# Patient Record
Sex: Female | Born: 1955 | Race: White | Hispanic: No | Marital: Married | Smoking: Former smoker
Health system: Southern US, Community
[De-identification: ages and names within clinical notes are randomized; demographics above are authoritative.]

## PROBLEM LIST (undated history)

## (undated) DIAGNOSIS — J309 Allergic rhinitis, unspecified: Secondary | ICD-10-CM

## (undated) DIAGNOSIS — K219 Gastro-esophageal reflux disease without esophagitis: Secondary | ICD-10-CM

## (undated) DIAGNOSIS — M1990 Unspecified osteoarthritis, unspecified site: Secondary | ICD-10-CM

## (undated) DIAGNOSIS — I878 Other specified disorders of veins: Secondary | ICD-10-CM

## (undated) DIAGNOSIS — I459 Conduction disorder, unspecified: Secondary | ICD-10-CM

## (undated) DIAGNOSIS — H409 Unspecified glaucoma: Secondary | ICD-10-CM

## (undated) HISTORY — PX: SHOULDER SURGERY: SHX246

## (undated) HISTORY — PX: COLONOSCOPY: SHX174

## (undated) HISTORY — PX: TUBAL LIGATION: SHX77

## (undated) HISTORY — PX: CHOLECYSTECTOMY: SHX55

## (undated) HISTORY — DX: Unspecified glaucoma: H40.9

## (undated) HISTORY — PX: JOINT REPLACEMENT: SHX530

## (undated) HISTORY — DX: Allergic rhinitis, unspecified: J30.9

## (undated) HISTORY — DX: Other specified disorders of veins: I87.8

## (undated) HISTORY — PX: OTHER SURGICAL HISTORY: SHX169

## (undated) HISTORY — PX: BACK SURGERY: SHX140

## (undated) HISTORY — DX: Unspecified osteoarthritis, unspecified site: M19.90

---

## 1997-08-12 ENCOUNTER — Ambulatory Visit (HOSPITAL_BASED_OUTPATIENT_CLINIC_OR_DEPARTMENT_OTHER): Admission: RE | Admit: 1997-08-12 | Discharge: 1997-08-12 | Payer: Self-pay | Admitting: Otolaryngology

## 1999-04-25 ENCOUNTER — Encounter: Payer: Self-pay | Admitting: Neurosurgery

## 1999-04-25 ENCOUNTER — Ambulatory Visit (HOSPITAL_COMMUNITY): Admission: RE | Admit: 1999-04-25 | Discharge: 1999-04-25 | Payer: Self-pay | Admitting: Neurosurgery

## 1999-05-02 ENCOUNTER — Encounter: Payer: Self-pay | Admitting: Neurosurgery

## 1999-05-04 ENCOUNTER — Inpatient Hospital Stay (HOSPITAL_COMMUNITY): Admission: RE | Admit: 1999-05-04 | Discharge: 1999-05-05 | Payer: Self-pay | Admitting: Neurosurgery

## 1999-05-04 ENCOUNTER — Encounter: Payer: Self-pay | Admitting: Neurosurgery

## 1999-09-26 ENCOUNTER — Encounter: Payer: Self-pay | Admitting: Family Medicine

## 1999-09-26 ENCOUNTER — Encounter: Admission: RE | Admit: 1999-09-26 | Discharge: 1999-09-26 | Payer: Self-pay | Admitting: *Deleted

## 2000-06-18 ENCOUNTER — Encounter: Payer: Self-pay | Admitting: Orthopaedic Surgery

## 2000-06-18 ENCOUNTER — Ambulatory Visit (HOSPITAL_COMMUNITY): Admission: RE | Admit: 2000-06-18 | Discharge: 2000-06-18 | Payer: Self-pay | Admitting: Orthopaedic Surgery

## 2000-07-31 ENCOUNTER — Ambulatory Visit (HOSPITAL_COMMUNITY): Admission: RE | Admit: 2000-07-31 | Discharge: 2000-07-31 | Payer: Self-pay | Admitting: Orthopaedic Surgery

## 2000-07-31 ENCOUNTER — Encounter: Payer: Self-pay | Admitting: Orthopaedic Surgery

## 2000-10-27 ENCOUNTER — Encounter: Admission: RE | Admit: 2000-10-27 | Discharge: 2000-10-27 | Payer: Self-pay | Admitting: Family Medicine

## 2000-10-27 ENCOUNTER — Encounter: Payer: Self-pay | Admitting: Family Medicine

## 2001-03-05 ENCOUNTER — Encounter: Payer: Self-pay | Admitting: Family Medicine

## 2001-03-05 ENCOUNTER — Ambulatory Visit (HOSPITAL_COMMUNITY): Admission: RE | Admit: 2001-03-05 | Discharge: 2001-03-05 | Payer: Self-pay | Admitting: Family Medicine

## 2001-11-13 ENCOUNTER — Encounter: Payer: Self-pay | Admitting: Family Medicine

## 2001-11-13 ENCOUNTER — Encounter: Admission: RE | Admit: 2001-11-13 | Discharge: 2001-11-13 | Payer: Self-pay | Admitting: Family Medicine

## 2002-12-15 ENCOUNTER — Encounter: Admission: RE | Admit: 2002-12-15 | Discharge: 2002-12-15 | Payer: Self-pay | Admitting: Family Medicine

## 2004-05-17 ENCOUNTER — Encounter: Admission: RE | Admit: 2004-05-17 | Discharge: 2004-05-17 | Payer: Self-pay | Admitting: Family Medicine

## 2005-06-26 ENCOUNTER — Encounter: Admission: RE | Admit: 2005-06-26 | Discharge: 2005-06-26 | Payer: Self-pay | Admitting: Family Medicine

## 2005-07-17 ENCOUNTER — Encounter: Admission: RE | Admit: 2005-07-17 | Discharge: 2005-07-17 | Payer: Self-pay | Admitting: Family Medicine

## 2005-09-04 ENCOUNTER — Ambulatory Visit: Payer: Self-pay | Admitting: Gastroenterology

## 2005-09-04 ENCOUNTER — Ambulatory Visit (HOSPITAL_COMMUNITY): Admission: RE | Admit: 2005-09-04 | Discharge: 2005-09-04 | Payer: Self-pay | Admitting: Gastroenterology

## 2006-01-09 ENCOUNTER — Encounter: Admission: RE | Admit: 2006-01-09 | Discharge: 2006-01-09 | Payer: Self-pay | Admitting: Family Medicine

## 2006-07-01 ENCOUNTER — Encounter: Admission: RE | Admit: 2006-07-01 | Discharge: 2006-07-01 | Payer: Self-pay | Admitting: Family Medicine

## 2007-05-05 ENCOUNTER — Ambulatory Visit: Payer: Self-pay | Admitting: Gastroenterology

## 2007-05-12 ENCOUNTER — Ambulatory Visit (HOSPITAL_COMMUNITY): Admission: RE | Admit: 2007-05-12 | Discharge: 2007-05-12 | Payer: Self-pay | Admitting: Gastroenterology

## 2007-05-12 ENCOUNTER — Encounter: Payer: Self-pay | Admitting: Gastroenterology

## 2007-05-12 ENCOUNTER — Ambulatory Visit: Payer: Self-pay | Admitting: Gastroenterology

## 2007-06-11 ENCOUNTER — Ambulatory Visit: Payer: Self-pay | Admitting: Gastroenterology

## 2007-07-02 ENCOUNTER — Encounter: Admission: RE | Admit: 2007-07-02 | Discharge: 2007-07-02 | Payer: Self-pay | Admitting: Family Medicine

## 2007-12-29 ENCOUNTER — Ambulatory Visit: Payer: Self-pay | Admitting: Gastroenterology

## 2008-06-12 ENCOUNTER — Emergency Department (HOSPITAL_COMMUNITY): Admission: EM | Admit: 2008-06-12 | Discharge: 2008-06-12 | Payer: Self-pay | Admitting: Emergency Medicine

## 2008-06-13 ENCOUNTER — Encounter (INDEPENDENT_AMBULATORY_CARE_PROVIDER_SITE_OTHER): Payer: Self-pay | Admitting: General Surgery

## 2008-06-13 ENCOUNTER — Ambulatory Visit (HOSPITAL_COMMUNITY): Admission: RE | Admit: 2008-06-13 | Discharge: 2008-06-13 | Payer: Self-pay | Admitting: Emergency Medicine

## 2008-07-12 ENCOUNTER — Emergency Department (HOSPITAL_COMMUNITY): Admission: EM | Admit: 2008-07-12 | Discharge: 2008-07-12 | Payer: Self-pay | Admitting: Emergency Medicine

## 2008-08-16 ENCOUNTER — Encounter: Admission: RE | Admit: 2008-08-16 | Discharge: 2008-08-16 | Payer: Self-pay | Admitting: Family Medicine

## 2008-08-23 ENCOUNTER — Inpatient Hospital Stay (HOSPITAL_COMMUNITY): Admission: RE | Admit: 2008-08-23 | Discharge: 2008-08-26 | Payer: Self-pay | Admitting: Orthopaedic Surgery

## 2009-03-12 ENCOUNTER — Emergency Department (HOSPITAL_COMMUNITY): Admission: EM | Admit: 2009-03-12 | Discharge: 2009-03-12 | Payer: Self-pay | Admitting: Family Medicine

## 2009-08-23 ENCOUNTER — Encounter: Admission: RE | Admit: 2009-08-23 | Discharge: 2009-08-23 | Payer: Self-pay | Admitting: Family Medicine

## 2010-02-25 ENCOUNTER — Encounter: Payer: Self-pay | Admitting: Family Medicine

## 2010-05-08 ENCOUNTER — Encounter (HOSPITAL_COMMUNITY)
Admission: RE | Admit: 2010-05-08 | Discharge: 2010-05-08 | Disposition: A | Discharge status: Home / Self Care | Payer: 59 | Source: Ambulatory Visit | Attending: Orthopaedic Surgery | Admitting: Orthopaedic Surgery

## 2010-05-08 LAB — APTT: aPTT: 36 seconds (ref 24–37)

## 2010-05-08 LAB — CBC
HCT: 37 % (ref 36.0–46.0)
Hemoglobin: 12.6 g/dL (ref 12.0–15.0)
MCH: 30.1 pg (ref 26.0–34.0)
MCV: 88.3 fL (ref 78.0–100.0)
Platelets: 239 10*3/uL (ref 150–400)
RDW: 12.5 % (ref 11.5–15.5)

## 2010-05-08 LAB — PROTIME-INR
INR: 0.99 (ref 0.00–1.49)
Prothrombin Time: 13.3 seconds (ref 11.6–15.2)

## 2010-05-13 LAB — BASIC METABOLIC PANEL
BUN: 4 mg/dL — ABNORMAL LOW (ref 6–23)
BUN: 6 mg/dL (ref 6–23)
CO2: 31 mEq/L (ref 19–32)
CO2: 32 mEq/L (ref 19–32)
CO2: 33 mEq/L — ABNORMAL HIGH (ref 19–32)
Calcium: 8.3 mg/dL — ABNORMAL LOW (ref 8.4–10.5)
Calcium: 8.5 mg/dL (ref 8.4–10.5)
Calcium: 9.9 mg/dL (ref 8.4–10.5)
Chloride: 102 mEq/L (ref 96–112)
Creatinine, Ser: 0.69 mg/dL (ref 0.4–1.2)
GFR calc Af Amer: >60 mL/min (ref >60)
GFR calc Af Amer: >60 mL/min (ref >60)
GFR calc non Af Amer: >60 mL/min (ref >60)
GFR calc non Af Amer: >60 mL/min (ref >60)
GFR calc non Af Amer: >60 mL/min (ref >60)
Glucose, Bld: 106 mg/dL — ABNORMAL HIGH (ref 70–99)
Glucose, Bld: 121 mg/dL — ABNORMAL HIGH (ref 70–99)
Glucose, Bld: 89 mg/dL (ref 70–99)
Potassium: 3.7 mEq/L (ref 3.5–5.1)
Potassium: 4 mEq/L (ref 3.5–5.1)
Sodium: 138 mEq/L (ref 135–145)
Sodium: 139 mEq/L (ref 135–145)

## 2010-05-13 LAB — CBC
HCT: 23.6 % — ABNORMAL LOW (ref 36.0–46.0)
HCT: 25.3 % — ABNORMAL LOW (ref 36.0–46.0)
HCT: 38.3 % (ref 36.0–46.0)
Hemoglobin: 8.2 g/dL — ABNORMAL LOW (ref 12.0–15.0)
Hemoglobin: 8.8 g/dL — ABNORMAL LOW (ref 12.0–15.0)
MCHC: 34.4 g/dL (ref 30.0–36.0)
MCV: 88.2 fL (ref 78.0–100.0)
MCV: 88.7 fL (ref 78.0–100.0)
Platelets: 242 10*3/uL (ref 150–400)
RDW: 13.4 % (ref 11.5–15.5)
RDW: 13.5 % (ref 11.5–15.5)
RDW: 13.6 % (ref 11.5–15.5)

## 2010-05-13 LAB — PROTIME-INR
INR: 1.3 (ref 0.00–1.49)
INR: 2.3 — ABNORMAL HIGH (ref 0.00–1.49)
INR: 4.5 — ABNORMAL HIGH (ref 0.00–1.49)
Prothrombin Time: 13.6 seconds (ref 11.6–15.2)
Prothrombin Time: 16.7 seconds — ABNORMAL HIGH (ref 11.6–15.2)
Prothrombin Time: 47.1 seconds — ABNORMAL HIGH (ref 11.6–15.2)

## 2010-05-13 LAB — APTT: aPTT: 31 seconds (ref 24–37)

## 2010-05-15 ENCOUNTER — Inpatient Hospital Stay (HOSPITAL_COMMUNITY)
Admission: RE | Admit: 2010-05-15 | Discharge: 2010-05-18 | DRG: 470 | Disposition: A | Discharge status: Home Health | Payer: 59 | Source: Ambulatory Visit | Attending: Orthopaedic Surgery | Admitting: Orthopaedic Surgery

## 2010-05-15 DIAGNOSIS — Z01812 Encounter for preprocedural laboratory examination: Secondary | ICD-10-CM

## 2010-05-15 DIAGNOSIS — M1610 Unilateral primary osteoarthritis, unspecified hip: Principal | ICD-10-CM | POA: Diagnosis present

## 2010-05-15 DIAGNOSIS — M169 Osteoarthritis of hip, unspecified: Principal | ICD-10-CM | POA: Diagnosis present

## 2010-05-15 LAB — DIFFERENTIAL
Basophils Absolute: 0 10*3/uL (ref 0.0–0.1)
Basophils Absolute: 0 10*3/uL (ref 0.0–0.1)
Basophils Relative: 0 % (ref 0–1)
Basophils Relative: 0 % (ref 0–1)
Eosinophils Absolute: 0.1 10*3/uL (ref 0.0–0.7)
Eosinophils Absolute: 0.1 10*3/uL (ref 0.0–0.7)
Eosinophils Relative: 1 % (ref 0–5)
Monocytes Absolute: 0.4 10*3/uL (ref 0.1–1.0)
Monocytes Absolute: 0.6 10*3/uL (ref 0.1–1.0)
Monocytes Relative: 4 % (ref 3–12)
Monocytes Relative: 6 % (ref 3–12)
Neutro Abs: 5.6 10*3/uL (ref 1.7–7.7)

## 2010-05-15 LAB — BASIC METABOLIC PANEL
CO2: 28 mEq/L (ref 19–32)
Calcium: 9.4 mg/dL (ref 8.4–10.5)
Chloride: 102 mEq/L (ref 96–112)
GFR calc Af Amer: >60 mL/min (ref >60)
Glucose, Bld: 106 mg/dL — ABNORMAL HIGH (ref 70–99)
Potassium: 3.5 mEq/L (ref 3.5–5.1)
Sodium: 136 mEq/L (ref 135–145)

## 2010-05-15 LAB — POCT CARDIAC MARKERS
CKMB, poc: <1 ng/mL — ABNORMAL LOW (ref 1.0–8.0)
Troponin i, poc: <0.05 ng/mL (ref 0.00–0.09)

## 2010-05-15 LAB — CBC
HCT: 40 % (ref 36.0–46.0)
HCT: 41.7 % (ref 36.0–46.0)
Hemoglobin: 14 g/dL (ref 12.0–15.0)
Hemoglobin: 14.4 g/dL (ref 12.0–15.0)
MCHC: 35.1 g/dL (ref 30.0–36.0)
MCV: 88.4 fL (ref 78.0–100.0)
Platelets: 204 10*3/uL (ref 150–400)
RBC: 4.52 MIL/uL (ref 3.87–5.11)
RDW: 12.9 % (ref 11.5–15.5)
RDW: 13.1 % (ref 11.5–15.5)
WBC: 11 10*3/uL — ABNORMAL HIGH (ref 4.0–10.5)

## 2010-05-15 LAB — COMPREHENSIVE METABOLIC PANEL
ALT: 16 U/L (ref 0–35)
Albumin: 3.9 g/dL (ref 3.5–5.2)
Alkaline Phosphatase: 62 U/L (ref 39–117)
Chloride: 98 mEq/L (ref 96–112)
Glucose, Bld: 108 mg/dL — ABNORMAL HIGH (ref 70–99)
Potassium: 4.2 mEq/L (ref 3.5–5.1)
Sodium: 135 mEq/L (ref 135–145)
Total Bilirubin: 0.7 mg/dL (ref 0.3–1.2)
Total Protein: 7.2 g/dL (ref 6.0–8.3)

## 2010-05-15 LAB — TYPE AND SCREEN: Antibody Screen: NEGATIVE

## 2010-05-15 LAB — APTT: aPTT: 32 seconds (ref 24–37)

## 2010-05-16 LAB — CBC
MCH: 29.6 pg (ref 26.0–34.0)
MCHC: 32.9 g/dL (ref 30.0–36.0)
MCV: 89.9 fL (ref 78.0–100.0)
Platelets: 203 10*3/uL (ref 150–400)
RDW: 12.8 % (ref 11.5–15.5)

## 2010-05-16 LAB — BASIC METABOLIC PANEL
BUN: 4 mg/dL — ABNORMAL LOW (ref 6–23)
CO2: 32 mEq/L (ref 19–32)
Calcium: 8.5 mg/dL (ref 8.4–10.5)
Creatinine, Ser: 0.73 mg/dL (ref 0.4–1.2)
Glucose, Bld: 146 mg/dL — ABNORMAL HIGH (ref 70–99)

## 2010-05-17 LAB — BASIC METABOLIC PANEL
BUN: 6 mg/dL (ref 6–23)
GFR calc non Af Amer: >60 mL/min (ref >60)
Glucose, Bld: 129 mg/dL — ABNORMAL HIGH (ref 70–99)
Potassium: 3.8 mEq/L (ref 3.5–5.1)

## 2010-05-17 LAB — CBC
Hemoglobin: 10.1 g/dL — ABNORMAL LOW (ref 12.0–15.0)
MCH: 30 pg (ref 26.0–34.0)
MCHC: 34 g/dL (ref 30.0–36.0)
Platelets: 231 10*3/uL (ref 150–400)
RDW: 12.5 % (ref 11.5–15.5)

## 2010-05-17 LAB — PROTIME-INR: Prothrombin Time: 20.4 seconds — ABNORMAL HIGH (ref 11.6–15.2)

## 2010-05-18 LAB — CBC
HCT: 25.7 % — ABNORMAL LOW (ref 36.0–46.0)
MCHC: 33.1 g/dL (ref 30.0–36.0)
MCV: 90.2 fL (ref 78.0–100.0)
RDW: 12.7 % (ref 11.5–15.5)

## 2010-05-18 LAB — BASIC METABOLIC PANEL
BUN: 6 mg/dL (ref 6–23)
CO2: 32 mEq/L (ref 19–32)
Chloride: 101 mEq/L (ref 96–112)
Creatinine, Ser: 0.7 mg/dL (ref 0.4–1.2)

## 2010-05-18 LAB — PROTIME-INR: INR: 3.25 — ABNORMAL HIGH (ref 0.00–1.49)

## 2010-06-05 NOTE — Op Note (Signed)
[name], [name]            ACCOUNT NO.:  [id number]  MEDICAL RECORD NO.:  [id number]           PATIENT TYPE:  I  LOCATION:  5019                         FACILITY:  [facility]  PHYSICIAN:  Lubertha Basque. Flavio Lindroth, M.D.DATE OF BIRTH:  09/16/1955  DATE OF PROCEDURE:  05/15/2010 DATE OF DISCHARGE:                              OPERATIVE REPORT   PREOPERATIVE DIAGNOSIS:  Right hip degenerative joint disease.  POSTOPERATIVE DIAGNOSIS:  Right hip degenerative joint disease.  PROCEDURE:  Right total hip replacement.  ANESTHESIA:  General.  ATTENDING SURGEON:  Lubertha Basque. Cassandra Santos, MD  ASSISTANTAve Filter, MD and Cassandra Jupiter, RN   INDICATIONS FOR PROCEDURE:  The patient is a 55 year old woman with long history of bilateral hip pain.  She is status post injections and pills and persists with difficulty.  She is a couple years out from a very successful hip replacement on one side and now is offered the same procedure on the right as she has pain which limits her ability to rest and walk.  Informed operative consent was obtained after discussion of possible complications including reaction to anesthesia, infection, DVT, PE, dislocation, and death.  SUMMARY OF FINDINGS AND PROCEDURE:  Under general anesthesia through a standard posterior approach, a right hip replacement was performed.  She had advanced degenerative change but excellent bone quality.  We addressed her problem with parts similar to her opposite side using a 5 high offset Summit stem with a 54 pinnacle cup and a hole eliminator. On this side we elected to use polyethylene and ceramic.  We placed a 10- degree +4 marathon liner with a +1.5 36 ceramic hip ball.  Jones Broom, MD assisted throughout and was invaluable to the completion of the case in that he helped position and retract while I performed the procedure.  He also closed simultaneously to help minimize OR time. [name] was also an  Geophysicist/field seismologist.  DESCRIPTION OF PROCEDURE:  The patient was taken to the operating suite where general anesthetic was applied without difficulty.  She was positioned in  lateral decubitus position with the right hip up.  An axillary roll was placed.  Hip positioners were utilized, and all bony prominences were appropriately padded.  She was then prepped and draped in normal sterile fashion.  After the administration of IV vancomycin, a posterior approach was taken to the right hip.  All appropriate antiinfective measures were used including the preoperative IV antibiotic, Betadine impregnated drape, and closed hooded exhaust systems for each member of surgical team.  A posterior incision was made with dissection down through an abundance of adipose tissue to the IT band and gluteus maximus fascia.  These structures were incised longitudinally to expose the short external rotators of the hip which were tagged and reflected.  A posterior capsulectomy was performed and the hip was dislocated.  A femoral neck cut was made above the lesser trochanter removing the femoral head.  The acetabulum was exposed and some labral tissues were excised.  Reaming was taken towards the inside wall of the pelvis and sequentially brought up to 53, followed by placement of a size 54 acetabular shell in appropriate  anteversion and tilt.  We placed a trial liner and turned our attention towards the femur.  This was reamed and broached up to size 5 which seemed to fit very well.  We performed a trial reduction and the +1.5 high offset seemed to give [place] best ability and leg length equality.  The trial components were removed.  We placed a hole eliminator in the acetabular shell followed by the +4 polyethylene lipped liner with the buildup in a posterior position.  On the femoral side, we placed a 5 high offset Summit stem which seated fully and capped this with the aforementioned ceramic hip ball which was 1.5 in  terms of length.  The hip was again reduced and was very stable in extension with external rotation as well as flexion with internal rotation.  Leg lengths came about equal.  The wound was irrigated, followed by reapproximation of short external rotators to the greater trochanteric region with nonabsorbable suture. IT band and gluteus maximus fascia reapproximated with #1 Vicryl in interrupted fashion followed by subcutaneous reapproximation in several layers with Vicryl and skin closure with staples.  A dry gauze dressing was applied.  Estimated blood loss was 200 mL and intraoperative fluids can be obtained from anesthesia records.  DISPOSITION:  The patient was extubated in the operating room and taken to recovery in stable condition.  She was to be admitted to the orthopedic surgery service for appropriate postop care to include perioperative antibiotics and Coumadin plus Lovenox for DVT prophylaxis.     Lubertha Basque [name], M.D.     PGD/MEDQ  D:  05/15/2010  T:  05/15/2010  Job:  [id number]  Electronically Signed by Marcene Corning M.D. on 06/05/2010 02:21:24 PM

## 2010-06-05 NOTE — Discharge Summary (Signed)
[name], [name]            ACCOUNT NO.:  [id number]  MEDICAL RECORD NO.:  [id number]           PATIENT TYPE:  I  LOCATION:  5019                         FACILITY:  [facility]  PHYSICIAN:  Lubertha Basque. Maxi Rodas, M.D.DATE OF BIRTH:  1955/07/11  DATE OF ADMISSION:  05/15/2010 DATE OF DISCHARGE:  05/18/2010                              DISCHARGE SUMMARY   ADMITTING DIAGNOSES: 1. Right hip end-stage degenerative joint disease. 2. Status post left hip replacement.  DISCHARGE DIAGNOSES: 1. Right hip end-stage degenerative joint disease. 2. Status post left hip replacement.  OPERATIONS:  Right total hip replacement.  BRIEF HISTORY:  [name] is a patient well known to our practice who has had left hip done and has done very well.  The right hip has been increasingly painful.  Her x-rays reveal end-stage DJD.  She is having pain when she ambulates, trouble sleeping at nighttime and it is all unrelieved by pain medicine and anti-inflammatory pills and we have discussed with her treatment options that being total hip replacement on the right side.  PERTINENT LABORATORY DATA AND X-RAY FINDINGS:  WBCs 8.7, RBCs 3.40, hemoglobin 10.3 with a drop to 8.5, platelets at 182.  Protimes and INRs drawn serially as she was on low-dose Coumadin protocol regulated by Pharmacy.  Sodium 137, potassium 4.3, glucose 106, BUN 6, creatinine 0.70.  COURSE IN THE HOSPITAL:  She was admitted postoperatively.  The order of control sheet for total joint replacements was implemented, which included Coumadin, Lovenox, anticoagulation regulated by Pharmacy. Perioperative antibiotics.  Appropriate stool softeners.  Knee-high TEDs.  Incentive spirometry.  Orthopedic p.r.n. orders were also implemented and then she was given appropriate pain medications, antiemetics, and antispasmodics.  Foley catheter was used indwelling for first 24 hours and then discontinued.  She was kept on her home medicines as well.   The first day postop, her vital signs were stable, blood pressure was within normal limits, Foley catheter was discontinued.  Her right hip was clean and dry and her lungs were clear. Abdomen was soft with positive bowel sounds.  The second day postop, she continued to progress well.  While her dressing was changed, her wound was noted to be benign.  No sign of infection.  Leg lengths appeared to be equal.  Working with physical therapy well and then later on the third day postop, she was discharged home.  CONDITION ON DISCHARGE:  Improved.  FOLLOWUP:  She will remain on Coumadin for a total of 4 weeks with an INR between 2 and 3.  She was given appropriate pain medicines, antispasmodics.  She could be touchdown weightbearing with a walker. Arrangements for Home Care for therapy and blood draws were arranged prior to her leaving the hospital.  She could be on a regular diet, advancing activity slowly.  Any sign of infection which is redness, increasing pain, drainage to call [phone], and we would see her back in the office immediately, otherwise at 2 weeks from the surgical date.     Lindwood Qua, P.A.   ______________________________ Lubertha Basque. Jerl Santos, M.D.    MC/MEDQ  D:  05/24/2010  T:  05/25/2010  Job:  [id number]  Electronically Signed by Lindwood Qua P.A. on 06/04/2010 09:39:29 AM Electronically Signed by Marcene Corning M.D. on 06/05/2010 02:21:29 PM

## 2010-06-19 NOTE — Assessment & Plan Note (Signed)
NAME:  [name], [name]             CHART#:  [id number]   DATE:  06/11/2007                       DOB:  Feb 05, 1955   PRIMARY CARE PHYSICIAN:  Dr. Lubertha South.   CHIEF COMPLAINT:  Follow-up colonoscopy/IBS.   PROBLEM LIST:  1. Irritable bowel syndrome alternates between constipation and      diarrhea, predominantly constipation, currently.  2. She had a ileocolonoscopy by Dr. Cira Servant on in May 12, 2007 for      rectal bleeding or bloody diarrhea.  She was found to have a normal      colonoscopy except for small internal hemorrhoids felt to be the      cause of her bleeding.  3. Two back surgeries.  4. Tubal ligation.  5. Left hip arthroscopy.   SUBJECTIVE:  [name] is a 55 year old female who is status post  colonoscopy for bloody diarrhea.  She had benign random biopsies.  She  has done well since her colonoscopy.  She was found to have internal  hemorrhoids.  She is having no further bleeding.  She denies any  abdominal pain.  Denies any melena.  She is more constipated at this  point.  She is having bowel movements anywhere between 2 and 3 days.  At  times she has diarrhea but this is only about 2 or 3 times per month.  Her weight has remained stable.  She denies any anorexia.  She does seem  to have diarrhea with foods such as pizza.  She denies any problems with  dairy products.   CURRENT MEDICATIONS:  See list from Jun 11, 2007.   ALLERGIES:  AMOXICILLIN AND PREDNISONE.   OBJECTIVE:  VITAL SIGNS:  Weight 180 pounds, height 63 inches,  temperature 98.4, blood pressure 110/70, pulse 76.  GENERAL:  [name] is a 55 year old Caucasian female who is alert,  oriented, pleasant, cooperative, in no acute distress.  HEENT:  Clear,  nonicteric.  Conjunctivae pink.  Oropharynx pink and moist without  lesions.  CHEST:  Heart regular rate and rhythm.  Normal S1-S2, without  murmurs, clicks, rubs or gallops.  ABDOMEN:  Protuberant with positive bowel sounds x4.  No bruits  auscultated.  Soft, nontender, non-distended without palpable mass,  hepatosplenomegaly.  No rebound/guarding.  EXTREMITIES:  Without  clubbing or edema.   ASSESSMENT:  [name] is a 55 year old female with irritable bowel  syndrome alternating between constipation and diarrhea, currently more  constipation predominant and history of internal hemorrhoids.   PLAN:  1. IBS literature given for her review.  2. Recommend a fiber supplement of choice,  either Benefiber or Fiber-      Sure or Fiber-Choice daily, MiraLax 17 grams as needed for      constipation.  3. Six months with Dr. Cira Servant or sooner if needed.       Lorenza Burton, N.P.  Electronically Signed     Kassie Mends, M.D.  Electronically Signed    KJ/MEDQ  D:  06/11/2007  T:  06/11/2007  Job:  [id number]   cc:   Donna Bernard, M.D.

## 2010-06-19 NOTE — Op Note (Signed)
NAMEBRITTAY, MOGLE            ACCOUNT NO.:  [id number]   MEDICAL RECORD NO.:  [id number]          PATIENT TYPE:  INP   LOCATION:  5038                         FACILITY:  [facility]   PHYSICIAN:  Lubertha Basque. Dalldorf, M.D.DATE OF BIRTH:  [birth year]   DATE OF PROCEDURE:  08/23/2008  DATE OF DISCHARGE:                               OPERATIVE REPORT   PREOPERATIVE DIAGNOSIS:  Left hip degenerative joint disease.   POSTOPERATIVE DIAGNOSIS:  Left hip degenerative joint disease.   PROCEDURE:  Left total hip replacement.   ANESTHESIA:  General.   ATTENDING SURGEON:  Lubertha Basque. Jerl Santos, MD   ASSISTANT:  Lindwood Qua, PA   INDICATIONS FOR PROCEDURE:  The patient is a 55 year old woman with a  long history of left hip pain and degeneration.  She failed oral anti-  inflammatories and has been treated with an arthroscopy 8 years ago and  intra-articular injections as well.  She has pain, which limits her  ability to rest and walk and at this point, she was offered a hip  replacement operation.  Informed operative consent was obtained after  discussion of possible complications including reaction to anesthesia,  infection, DVT, PE, dislocation, and death.   SUMMARY/FINDINGS AND PROCEDURE:  Under general anesthesia through a  standard posterior approach, a left total hip replacement was performed.  She had advanced degenerative change, but excellent bone quality.  We  addressed her problem with an uncemented DePuy system using a 5 high-  offset Summit stem capped with a 36 +5 hip ball.  On the acetabular  side, we placed a 52 100-series pinnacle cup with a 36 x 52 metal-on-  metal liner.  We did use a hole eliminator.  Bryna Colander assisted  throughout and was invaluable to the completion of the case, in that he  helped position and retract while I performed the procedure.  He also  closed simultaneously to help minimize OR time.   DESCRIPTION OF PROCEDURE:  The patient was taken to the  operating suite  where general anesthetic was applied without difficulty.  She was  positioned on the lateral decubitus position with the left hip up.  All  bony prominences were appropriately padded, axillary roll was placed,  and hip positioners were utilized.  She was prepped and draped in normal  sterile fashion.  After administration of IV Kefzol, posterior approach  was taken to the left hip.  Incision was made with dissection down  through an abundance of adipose tissue to the IT band.  All appropriate  anti-infected measures were used including Betadine impregnated drape,  closed-hooded exhaust systems for each member of the surgical team, and  the preoperative IV antibiotic.  The IT band and gluteus maximus fascia  were incised longitudinally to expose the short external rotators of the  hip, which were tagged and reflected.  A posterior capsulectomy was  performed.  The hip was dislocated.  The femoral neck cut was made above  the lesser trochanter.  The acetabulum was exposed and some labral  tissues were excised.  This was reamed toward the inner wall of the  pelvis with  a small reamer followed by up-reaming to the size 51 reamer  followed placement of the size 52 pinnacle cup in appropriate tilt and  anteversion.  A trial liner was placed.  Attention was turned toward the  femur, which was appropriately exposed.  This was reamed and then  broached up to a size 5, which seemed to fit well.  We did a trial  reduction with various head neck assemblies in the high-offset +5 and  that give [place] the best stability in leg length.  She was stable in  extension with external rotation and flexion with internal rotation.  The trial components were removed followed by placement of size 5 high-  offset Summit stem in appropriate anteversion.  A hole eliminator was  placed into the pinnacle cup followed by a 36 x 52 metal-on-metal liner.  On the femur, we placed the aforementioned 36 +5 hip  ball.  The hip was  again reduced and was stable in the aforementioned positions.  Leg  lengths were felt to be equal.  The wound was irrigated followed by  reapproximation of short external rotators to the greater trochanteric  region with nonabsorbable suture.  IT band and gluteus maximus fascia  were reapproximated with #1 Vicryl in interrupted fashion followed by  subcutaneous reapproximation in several layers with Vicryl.  Skin was  closed with staples.  We then placed a Mepilex dressing.  Estimated  blood loss was 100 mL and intraoperative fluids were obtained from  anesthesia records.   DISPOSITION:  The patient was extubated in the operating room and taken  to recovery room in stable condition.  She was admitted to the  Orthopedic Surgery Service for appropriate postop care to include  perioperative antibiotics and Coumadin plus Lovenox for DVT prophylaxis.      Lubertha Basque Jerl Santos, M.D.  Electronically Signed     PGD/MEDQ  D:  08/23/2008  T:  08/24/2008  Job:  [id number]

## 2010-06-19 NOTE — Op Note (Signed)
[name], [name]            ACCOUNT NO.:  [id number]   MEDICAL RECORD NO.:  [id number]          PATIENT TYPE:  AMB   LOCATION:  DAY                           FACILITY:  [facility]   PHYSICIAN:  Kassie Mends, M.D.      DATE OF BIRTH:  Sep 02, 1955   DATE OF PROCEDURE:  05/12/2007  DATE OF DISCHARGE:                               OPERATIVE REPORT   PROCEDURE:  Ileocolonoscopy with cold forceps biopsy.   INDICATION FOR EXAM:  [name] is a 55 year old female who  presented with acute onset of bloody diarrhea.   FINDINGS:  1. Normal terminal ileum.  Proximal 20 cm visualized.  2. Normal colon without evidence of polyps, mass, inflammatory      changes, diverticula or arteriovenous malformations.  Random      biopsies obtained via cold forceps to evaluate for microscopic      colitis.  3. Small internal hemorrhoids, which bled on digital exam.  Otherwise      normal retroflexed view of the rectum.   DIAGNOSIS:  No source for [name]'s diarrhea identified.  Her  rectal bleeding is likely secondary to hemorrhoids.   RECOMMENDATIONS:  1. Will call [name] with the results of her biopsies.  2. Screening colonoscopy in 10 years.  She should follow a high fiber      diet.  She is given handout on high-fiber diet and hemorrhoids.  3. Recommend Anusol-HC per rectum every 12 hours for 14 days.  4. Return visit in 4 weeks with Dr. Cira Servant to reassess her rectal      bleeding and diarrhea.   MEDICATIONS:  1. Demerol 50 mg IV.  2. Versed 4 mg IV.   PROCEDURE TECHNIQUE:  Physical exam was performed.  Informed consent was  obtained from the patient after explaining the benefits, risks and  alternatives to the procedure.  The patient was connected to the monitor  and placed in left lateral position.  Continuous oxygen was provided by  nasal cannula and IV medicine administered through an indwelling  cannula.  After administration of sedation and rectal exam, the  patient's  rectum was intubated and scope was advanced under direct  visualization to the distal terminal ileum.  The scope was removed  slowly by carefully  examining the color, texture, anatomy and integrity of the mucosa on the  way out.  The patient was recovered in endoscopy and discharged home in  satisfactory condition.   PATH:  RANDOM BIOPSIES NL.      Kassie Mends, M.D.  Electronically Signed     SM/MEDQ  D:  05/12/2007  T:  05/12/2007  Job:  [id number]   cc:   Donna Bernard, M.D.  Fax: [phone]

## 2010-06-19 NOTE — Consult Note (Signed)
[name], [name]           ACCOUNT NO.:  [id number]   MEDICAL RECORD NO.:  [id number]           PATIENT TYPE:  AMB   LOCATION:  DAY                           FACILITY:  [facility]   PHYSICIAN:  Kassie Mends, M.D.      DATE OF BIRTH:  August 21, 1955   DATE OF CONSULTATION:  05/05/2007  DATE OF DISCHARGE:                                 CONSULTATION   REFERRING PHYSICIAN:  Lubertha South   PROBLEM LIST:  1. Bloody diarrhea.  2. History of depression.  3. Allergies.  4. Glaucoma.   SUBJECTIVE:  [name] is a 55 year old female who presents as a  return patient visit.  She was last seen in August 2007 for colon cancer  screening.  At that time, her colonoscopy was unremarkable.  She states  approximately 1 month ago, she began to have bloody diarrhea.  She was  treated with Cipro and Flagyl which helped, but 1-2 times a week she  still sees blood in her stool.  Occasionally, she has had severe nausea  associated with the bloody stools.  She does not have any history of  hemorrhoids.  She denies any abdominal cramping or pain associated with  the rectal bleeding.  Her stool vary from being hard to diarrhea once a  week.  This is a change in her bowel habits.  She usually has a cup of  coffee and then a normal stool.  She has multiple stressors in her life,  job and personal.  She did stop using Celebrex in January 2009 but uses  Aleve once a week.  She denies any use of aspirin, BC's, or Goody  Powders.  She had no previous problems taking Aleve.   PAST MEDICAL HISTORY:  None.  She has never had a stroke or heart  attack.   PAST SURGICAL HISTORY:  1. Tubal ligation.  2. Two back surgeries.  3. Left hip arthroscopy.   ALLERGIES:  AMOXICILLIN AND PREDNISONE.   FAMILY HISTORY:  Her mother had polyps at age greater than 74.  She has  no family history of colon cancer, ulcerative colitis, or Crohn's  disease.   SOCIAL HISTORY:  She works for [employer] and [employer].  She quit smoking 24 years ago.  She used to smoke 2-3  cigarettes a day.  She denies any alcohol use.   MEDICATIONS:  None.   OBJECTIVE:   PHYSICAL EXAM:  Weight 186.5 pounds, height 5 feet three inches,  temperature 98.1, blood pressure 118/80, pulse 60.  GENERAL:  She is in  no apparent distress, alert and orient x4.  HEENT:  Atraumatic, normocephalic.  Pupils equal and reactive to light.  Mouth:  No oral lesions.  Posterior pharynx without erythema or exudate.  NECK:  Full range of motion.  No lymphadenopathy.  LUNGS:  Clear to  auscultation bilaterally.  CARDIOVASCULAR:  Regular rhythm, no  murmur.ABDOMEN:  Bowel sounds are present, soft, nontender,  nondistended.  EXTREMITIES:  No cyanosis or edema.  SKIN:  No pretibial  lesions.  NEURO:  She has no focal neurologic deficits.   ASSESSMENT:  [name] is a 55 year old female who has acute onset  of bloody diarrhea.  The differential diagnosis includes hemorrhoids  with irritable bowel syndrome, colitis (infectious, inflammatory), and a  low likelihood of colorectal cancer or NSAID-induced colitis.  Thank you  for allowing me to see [name] in consultation.  My  recommendations follow.   RECOMMENDATIONS:  1. We will schedule a colonoscopy with MOVI prep next week.  2. We will schedule her follow up appointment based on the findings of      her colonoscopy.      Kassie Mends, M.D.  Electronically Signed     SM/MEDQ  D:  05/05/2007  T:  05/05/2007  Job:  [id number]   cc:   [name], M.D.  Fax: [phone]

## 2010-06-19 NOTE — Discharge Summary (Signed)
NAMEBHUMI, GODBEY            ACCOUNT NO.:  [id number]   MEDICAL RECORD NO.:  [id number]          PATIENT TYPE:  INP   LOCATION:  5038                         FACILITY:  [facility]   PHYSICIAN:  Lubertha Basque. Dalldorf, M.D.DATE OF BIRTH:  [birth year]   DATE OF ADMISSION:  08/23/2008  DATE OF DISCHARGE:  08/26/2008                               DISCHARGE SUMMARY   ADMITTING DIAGNOSIS:  Left hip end-stage degenerative joint disease.   DISCHARGE DIAGNOSIS:  Left hip end-stage degenerative joint disease.   OPERATIONS:  Left total hip replacement.   BRIEF HISTORY:  [name] is a 55 year old white female.  The  patient is well known to our practice.  She has been having increasing  left hip pain, pain when she walks, trouble sleeping at nighttime and x-  rays reveal end-stage DJD.  She has had a hip arthroscopy back in 2002,  which helped temporarily and she has been on anti-inflammatory  medicines, which no longer are helpful along with Tylenol No. 3.   PERTINENT LABORATORY AND X-RAY FINDINGS:  WBC 7.1, hemoglobin 13.2 with  a drop to 8.5, hematocrit 38.3.  Last INR was 4.5.  Sodium 139,  potassium within the normal range, creatinine 0.79, BUN 6.   COURSE IN THE HOSPITAL:  Denita was admitted postoperatively, placed on  variety of IV pain medicines including a Dilaudid PCA pump, vancomycin  protocol for preventative antibiotics, Lovenox and Coumadin per pharmacy  protocol, and then other p.o. medicines which included antiemetics,  sleeping agents, muscle relaxers.  She was in a knee immobilizer while  in bed to prevent dislocation.  Physical therapy to be touchdown  weightbearing as tolerated.  She had knee-high TEDs, incentive  spirometry, and increased activity diet was advanced as tolerated.  The  first day postop, her wound was benign, her lungs were clear, her  abdomen was soft.  Blood pressure 98/61, temperature 99.  Hemoglobin  9.7, INR 1.3.  Her INR jumped to 2.3 at the second  day.  Lungs were  clear.  Abdomen was soft.  Voiding on her own and Foley catheter had  been discontinued.  Wound was noted to be benign.  Dressing changed.  On  the third day, postop, her INR was 4.5.  Coumadin was held on that day,  which she was feeling good, was getting up and walking with therapy,  touchdown weightbearing, wanted to be discharged home.  Dressing was  changed.  Wound was noted to be benign.   CONDITION ON DISCHARGE:  Improved.   FOLLOWUP:  She will be on a low-sodium heart-healthy diet.  May change  her dressing daily.  Touchdown weightbearing as tolerated.  Return to  Dr. Jerl Santos in 10-14 days.   Percocet prescription given one or two every 4-6 hours for pain, Robaxin  one q.8 p.r.n. spasm, Coumadin dose to be regulated by pharmacy, dose  held the day of discharge and requested an INR to be drawn by [facility], Saturday or even possibly Sunday, and then dose regulated  depending on that result.  She is warned of any sign of infection,  bleeding, increasing  redness, drainage to call our office, we would see  her immediately, [phone].      Lindwood Qua, P.A.      Lubertha Basque Jerl Santos, M.D.  Electronically Signed    MC/MEDQ  D:  08/26/2008  T:  08/27/2008  Job:  [id number]

## 2010-06-19 NOTE — Op Note (Signed)
NAMEMAXWELL, MARTORANO            ACCOUNT NO.:  [id number]   MEDICAL RECORD NO.:  [id number]          PATIENT TYPE:  AMB   LOCATION:  DAY                           FACILITY:  [facility]   PHYSICIAN:  Dalia Heading, M.D.  DATE OF BIRTH:  February 04, 1956   DATE OF PROCEDURE:  06/13/2008  DATE OF DISCHARGE:  06/13/2008                               OPERATIVE REPORT   PREOPERATIVE DIAGNOSES:  1. Cholecystitis.  2. Cholelithiasis.  3. Incisional hernia.   POSTOPERATIVE DIAGNOSES:  1. Cholecystitis.  2. Cholelithiasis.  3. Incisional hernia.   PROCEDURES:  1. Laparoscopic cholecystectomy.  2. Incisional herniorrhaphy with mesh.   SURGEON:  Dalia Heading, MD   ANESTHESIA:  General endotracheal.   INDICATIONS:  The patient is a 55 year old white female presents with  right upper quadrant abdominal pain and nausea.  She was found on  ultrasound gallbladder to have cholecystitis with cholelithiasis.  Her  common bile duct was noted be within normal limits.  On examination, she  also has an incisional hernia at the umbilicus due to a previous tubal  ligation.  The risks and benefits of both procedures including bleeding,  infection, hepatobiliary injury, and the possibility of an open  procedure were fully explained to the patient, we gave informed consent.   PROCEDURE NOTE:  The patient was placed in supine position.  After  induction of general endotracheal anesthesia, the abdomen was prepped  and draped using the usual sterile technique with DuraPrep.  Surgical  site confirmation was performed.   An infraumbilical incision was made down to the fascia.  A Veress needle  was introduced into the abdominal cavity and confirmation of placement  was done using the saline drop test.  The abdomen was then insufflated  to 16 mmHg pressure.  A 11-mm trocar was introduced into the abdominal  cavity under direct visualization without difficulty.  The patient was  placed in reverse Trendelenburg  position.  An additional 11-mm trocar  was placed in the epigastric region and 5 mm trocars were placed in the  right upper quadrant and right flank regions.  Liver was inspected and  noted be within normal limits.  The gallbladder was noted to be  thickened and distended.  The gallbladder had to be decompressed at the  fundus.  Hydrops of the gallbladder was found.  Multiple stones were  noted throughout the gallbladder and into the cystic duct.  In the  cystic duct, they were milked up into the lumen of the gallbladder.  The  gallbladder was dissected in a retrograde fashion from the fundus to the  cystic duct.  The juncture of the infundibulum and the cystic duct were  fully identified.  A vascular Endo-GIA was placed across the cystic duct  and fired.  The cystic artery was not fully identified.  They suspected  that the cholecystitis caused the cystic artery to be obliterated.  Several stones did spill out from the gallbladder while it was being  removed using an EndoCatch bag.  These were retrieved as able.  The  gallbladder fossa was inspected.  No abnormal bleeding or  bile leakage  was noted.  Surgicel was placed in the gallbladder fossa.  All fluid and  air were then evacuated from the abdominal cavity prior to removal of  the trocars.   Next, the infraumbilical incision was extended.  An incisional  herniorrhaphy was then performed.  The umbilicus was freed away from the  hernia sac.  The hernia sac was excised and omentum was noted to be  entrapped within it.  This was reduced without difficulty.  A Proceed  Ventral Patch was then secured to the fascia using 2-0 Novofil  interrupted sutures.  The overlying fascia was then reapproximated using  0 Ethibond interrupted sutures.  The base of the umbilicus was secured  back to the fascia using a 2-0 Vicryl interrupted suture.  The  subcutaneous layer was reapproximated using a 3-0 Vicryl interrupted  suture.  Skin was closed using  staples.  A 0 Vicryl interrupted sutures  were used to close the fascia of the epigastric wound.  The remaining  incisions were closed using staples.  A 0.5-cm Sensorcaine was instilled  in the surrounding wounds.  Betadine ointment and dry sterile dressings  were applied.   All tape and needle counts were correct at the end of the procedure.  The patient was extubated in the operating room and went back to  recovery room awake in stable condition.   COMPLICATIONS:  None.   SPECIMEN:  Gallbladder.   ESTIMATED BLOOD LOSS:  Less than 50 mL.      Dalia Heading, M.D.  Electronically Signed     MAJ/MEDQ  D:  06/13/2008  T:  06/14/2008  Job:  [id number]   cc:   Donna Bernard, M.D.  Fax: [phone]

## 2010-06-19 NOTE — Assessment & Plan Note (Signed)
NAME:  [name], [name]             CHART#:  [id number]   DATE:  12/29/2007                       DOB:  [birth year]   REFERRING PHYSICIAN:  Donna Bernard, MD.   PROBLEM LIST:  1. Irritable bowel with intermittent constipation and diarrhea, but      predominantly constipation.  2. Ileal colonoscopy in April 2009 for rectal bleeding, which showed      small hemorrhoids.  3. Back surgery.  4. Tubal ligation.  5. Left hip arthroscopy.   SUBJECTIVE:  The patient is a 55 year old female who was last seen in  May 2009.  She still occasionally sees hemorrhoidal bleeding, but is  much improved since using the rectal suppositories.  She takes MiraLax  once a week.  She does not take any fiber supplements.  She is trying to  eat high-fiber diet by using raisin, bran, and muffins.  She has right  hip pain and so, she is on Tylenol No. 3, which causes constipation.  She is scheduled to have surgery in July 2009.   MEDICATIONS:  1. Aleve as needed.  2. Tylenol as needed.   OBJECTIVE/PHYSICAL EXAM:  VITAL SIGNS:  Weight 196 pounds (up 16 pounds  since May 2009), height 5 feet 3 inches, temperature 98, blood pressure  100/80, and pulse 72.  GENERAL:  She is in no apparent distress.  Alert and oriented x4.LUNGS:  Clear to auscultation bilaterally.CARDIOVASCULAR:  Regular rhythm.  ABDOMEN:  Bowel sounds present, soft, nontender, and nondistended.   ASSESSMENT:  The patient is a 55 year old female who had rectal bleeding  from hemorrhoids and her symptoms are fairly well controlled.  Her  constipation and diarrhea have resolved.  Thank you for allowing me to  see the patient in consultation.  My recommendations follow.   RECOMMENDATIONS:  Schedule follow up appointment as needed.  She should  continue high-fiber diet and MiraLax as needed.       Kassie Mends, M.D.  Electronically Signed     SM/MEDQ  D:  12/29/2007  T:  12/30/2007  Job:  [id number]   cc:   Donna Bernard, M.D.

## 2010-06-22 NOTE — Discharge Summary (Signed)
[place]. [facility]  Patient:    [name], [name]                   MRN: [id number] Adm. Date:  [id number] Disc. Date: 05/05/99 Attending:  Danella Penton Dictator:   Payton Doughty, M.D.                           Discharge Summary  ADMISSION DIAGNOSIS:  Herniated disc at right L5-S1.  DISCHARGE DIAGNOSIS:  Herniated disc at right L5-S1.  OPERATIONS:  L5-S1 laminectomy and diskectomy.  HISTORY OF PRESENT ILLNESS:  This is a 55 year old right-handed white female whose history and physical is recounted in the chart.  She visited Dr. Jeral Fruit for leg  pain.  She has an MRI that shows a herniated disc at L5-S1.  She was admitted for diskectomy.  PAST MEDICAL HISTORY: 1. Anterior cervical diskectomy in 1998. 2. Tubal ligation.  ALLERGIES:  AMOXICILLIN.  PHYSICAL EXAMINATION:  General examination is unremarkable.  NEUROLOGIC:  Right S1 radiculopathy.  HOSPITAL COURSE:  She was admitted after ascertaining normal laboratory values nd underwent an L5-S1 diskectomy on the right side.  Postoperatively, she has done  very well.  Her strength is full, her incision is dry.  She is eating and voiding normally.  She has a bit of S1 numbness, but strength is good.  Her incision is dry and well healing.  She is being discharged home to the care of her family with Percocet for pain.  [facility] a phone call on Monday to arrange follow up with Dr. Jeral Fruit. DD:  05/05/99 TD:  05/05/99 Job: [id number]

## 2010-06-22 NOTE — Op Note (Signed)
[name], [name]            ACCOUNT NO.:  [id number]   MEDICAL RECORD NO.:  [id number]          PATIENT TYPE:  AMB   LOCATION:  DAY                           FACILITY:  [facility]   PHYSICIAN:  Kassie Mends, M.D.      DATE OF BIRTH:  September 30, 1955   DATE OF PROCEDURE:  09/04/2005  DATE OF DISCHARGE:                                 OPERATIVE REPORT   PROCEDURE:  Colonoscopy.   MEDICATIONS:  1. Demerol 75 mg IV.  2. Versed 4 mg IV.   INDICATIONS FOR EXAM:  [name] is a 55 year old female who presents  for average risk colon cancer screening.   FINDINGS:  1. Normal colon.  No evidence of polyps, masses, inflammatory changes, or      vascular ectasia seen.  2. No diverticula evident.  3. Normal retroflex view of the rectum.   RECOMMENDATIONS:  1. Screening colonoscopy in ten years.  2. Follow up Dr. Simone Curia.   PROCEDURE TECHNIQUE:  Physical exam was performed.  Informed consent was  obtained from the patient after explaining all risks, benefits and  alternative to the procedure which the patient appeared to understand and so  stated.  The patient was connected to the monitoring device and placed in  the left lateral position.  Continuous oxygen was provided by nasal cannula  and IV medicine administered through an indwelling cannula.  After  administration of sedation and a rectal exam, the patient's rectum was  intubated.  The scope was advanced under direct visualization to  the cecum.  The scope was subsequently removed slowly by carefully examining  the color, texture, anatomy and integrity of the mucosa on the way out.  The  patient was recovered in the endoscopy suite and discharged to home in  satisfactory condition.      Kassie Mends, M.D.  Electronically Signed     SM/MEDQ  D:  09/05/2005  T:  09/05/2005  Job:  [id number]   cc:   Donna Bernard, M.D.  Fax: [phone]

## 2010-06-22 NOTE — H&P (Signed)
[place]. [facility]  Patient:    [name], [name]                     MRN: [id number] Adm. Date:  05/04/99 Attending:  Tanya Nones. Jeral Fruit, M.D.                         History and Physical  BRIEF HISTORY:  [name] is a lady who was seen by me in my office because of back pain with radiation down to the right leg.  We tried conservative treatment but she is not any better.  The pain is associated with numbness and weakness which is mostly outside the foot.  After failure of conservative treatment we went ahead and did an MRI.  Indeed the MRI showed a large herniated disc at L5-S1 and the distal 1-2.  We went ahead with surgery because she could not deal with the pain.  PAST MEDICAL HISTORY:  Anterior cervical discectomy done in 1998.  She had a tubal ligation.  ALLERGIES:  She is allergic to AMOXICILLIN.  SOCIAL HISTORY:  She does not smoke, does not drink.  FAMILY HISTORY:  Unremarkable.  PHYSICAL EXAMINATION:  The patient came to my office and she was limping from the right leg.  She had difficulty sitting.  HEENT:  Normal.  NECK:  Normal.  LUNGS:  Clear.  HEART:  Heard sounds normal.  ABDOMEN:  Normal.  EXTREMITIES:  Normal pulses.  There is a scar on the left side of the neck from previous surgery.  NEUROLOGIC:  Mental status normal.  Cranial nerves normal.  Sensory is normal except that she complains of some numbness on the outside of the foot going along with the S1 nerve root.  Reflexes are 2+ with absent right ankle jerk. Strength 5/5 with weakness on plantar flexion, dorsiflexion of the right foot.  Straight leg raising on the left side negative, right side is positive at about 45 degrees.  Coordination normal.  LABORATORY DATA:  The MRI showed a large herniated disc at the level of 5-1 with a free fragment.  CLINICAL IMPRESSION:  Right L5-S1 herniated disc.  RECOMMENDATIONS:  The patient is being admitted for  surgery.  She knows about the risks such as infection, CSF leak, worsening of pain, paralysis, need for further surgery.  She declined more opinion. DD:  05/04/99 TD:  05/04/99 Job: [id number]

## 2010-06-22 NOTE — Op Note (Signed)
[place]. [facility]  Patient:    [name], [name]                   MRN: [id number] Proc. Date: 07/31/00 Adm. Date:  [id number] Disc. Date: [id number] Attending:  Marcene Corning                           Operative Report  PREOPERATIVE DIAGNOSIS:  Left hip labral tear.  POSTOPERATIVE DIAGNOSIS:  Left hip labral tear.  OPERATION PERFORMED:  Left hip arthroscopy.  ANESTHESIA:  General.  ATTENDING SURGEON:  Lubertha Basque. Jerl Santos, M.D.  ASSISTANT:  Lindwood Qua, P.A.  INDICATIONS FOR PROCEDURE:  The patient is a 55 year old woman with a long history of a painful catch in her left hip.  This persisted despite therapy and oral anti-inflammatories.  She has undergone a preoperative MRI arthrogram which shows an abnormality of the labrum with minimal degenerative change.  At this point she was offered operative arthroscopy.  The procedure was discussed with the patient and informed operative consent was obtained after discussion of possible complications of reaction to anesthesia, infection and neurovascular injury.  DESCRIPTION OF PROCEDURE:  The patient was taken to an operating suite where general anesthetic was applied without difficulty.  She was then positioned in the lateral decubitus position where all bony prominences were appropriately padded including the placement of an axillary roll.  She was then given some longitudinal traction across a post in the groin which was well-padded.  She was prepped and draped in normal sterile fashion.  After the administration of preop intravenous antibiotics, a needle was placed over the greater trochanter into the hip joint under fluoroscopic guidance.  The joint was then injected with about 30 cc of sterile fluid.  A posterior portal was made 4 cm posterior to the greater trochanter and an anterior portal was made 4 cm anterior to the greater trochanter.  In both cases, the capsule was entered sharply with  a needle followed by dilatation up to 5 or 7 mm.  The scope was placed through one of these portals and an instrument through the other.  Through this we could see some mild degenerative change but certainly no evidence of end stage degenerative arthritis.  She did have some abnormality of the labrum consistent with a tear and this was debrided with a shaver.  There was also some moderate synovitis which was addressed with synovectomy.  The joint was thoroughly irrigated followed by placement of Marcaine with epinephrine and Depo-Medrol at the end of the case.  The portals were closed with simple sutures of nylon followed by Adaptic and a dry gauze dressing with tape. Estimated blood loss and intraoperative fluids can be obtained from Anesthesia records.  Traction was released during closure.  DISPOSITION:  The patient was extubated in the operating room and taken to the recovery room in stable condition.  Plans were for her to go home the same day and to follow up in the office in less than a week.  I will contact her by phone tonight. DD:  07/31/00 TD:  07/31/00 Job: [id number]

## 2010-06-22 NOTE — Op Note (Signed)
[place]. [facility]  Patient:    [name], [name]                   MRN: [id number] Proc. Date: 05/04/99 Adm. Date:  [id number] Attending:  Danella Penton                           Operative Report  PREOPERATIVE DIAGNOSIS:  Right L5-S1 herniated disk free fragment.  POSTOPERATIVE DIAGNOSIS:  Right L5-S1 herniated disk free fragment.  OPERATION PERFORMED:  Right L5-S1 diskectomy, removal of free fragment, decompression of the L5 and S1 nerve root.  MICROSCOPE:  Midas Rex.  SURGEON:  Tanya Nones. Jeral Fruit, M.D.  ASSISTANT:  Alanson Aly. Roxan Hockey, M.D.  ANESTHESIA:  INDICATIONS FOR PROCEDURE:  The patient had been seen by me because of right leg pain.  The pain is getting worse up to the point that last week she was having  lot of weakness and numbness.  MRI showed a large herniated disk with a free fragment.  The patient wanted to go ahead with surgery.  She knew of the risks uch as infection, CSF leak, worsening of the pain, paralysis, need for further surgery.  This lady about three years ago underwent an anterior cervical diskectomy.  DESCRIPTION OF PROCEDURE:  The patient was taken to the operating room and positioned in a prone manner.  The skin was prepped with Betadine.  A midline incision was made.  It was difficult to palpate the spinous process because of er size.  Nevertheless we went through the skin and through a thick layer of fat tissue.  We found the spinous process and with the x-ray we showed that indeed e were at the L5-1.  With the Midas Rex drill we drilled the lower level of L5, the upper of S1.  We brought the microscope into the area and with microdissection, we removed the yellow ligament.  We found the S1 nerve root to be swollen and almost glued to the canal.  Retraction was made and immediately we found three large fragments of disks going to the foramen.  Removal was done.  We entered the disk space and total  diskectomy was done.  ____________ of the thecal sac was done and we found there was a piece of end plate close to the L5 nerve root at the take-off. Removal was done.  Foraminotomy with decompression of L5-S1 was done.  The area was irrigated.  Fentanyl was left in the dural space.  Also fat was left over the area. The wound was closed with Vicryl and Steri-Strips. DD:  05/04/99 TD:  05/05/99 Job: [id number]

## 2010-08-30 ENCOUNTER — Other Ambulatory Visit: Payer: Self-pay | Admitting: Family Medicine

## 2010-08-30 DIAGNOSIS — Z1231 Encounter for screening mammogram for malignant neoplasm of breast: Secondary | ICD-10-CM

## 2010-09-05 ENCOUNTER — Ambulatory Visit
Admission: RE | Admit: 2010-09-05 | Discharge: 2010-09-05 | Disposition: A | Discharge status: Home / Self Care | Payer: 59 | Source: Ambulatory Visit | Attending: Family Medicine | Admitting: Family Medicine

## 2010-09-05 DIAGNOSIS — Z1231 Encounter for screening mammogram for malignant neoplasm of breast: Secondary | ICD-10-CM

## 2011-01-10 ENCOUNTER — Other Ambulatory Visit: Payer: Self-pay | Admitting: Family Medicine

## 2011-01-10 DIAGNOSIS — Z139 Encounter for screening, unspecified: Secondary | ICD-10-CM

## 2011-01-15 ENCOUNTER — Other Ambulatory Visit (HOSPITAL_COMMUNITY): Payer: 59

## 2011-01-17 ENCOUNTER — Ambulatory Visit (HOSPITAL_COMMUNITY)
Admission: RE | Admit: 2011-01-17 | Discharge: 2011-01-17 | Disposition: A | Discharge status: Home / Self Care | Payer: 59 | Source: Ambulatory Visit | Attending: Family Medicine | Admitting: Family Medicine

## 2011-01-17 ENCOUNTER — Ambulatory Visit: Payer: 59 | Attending: Family Medicine | Admitting: Sleep Medicine

## 2011-01-17 DIAGNOSIS — Z780 Asymptomatic menopausal state: Secondary | ICD-10-CM | POA: Insufficient documentation

## 2011-01-17 DIAGNOSIS — Z13820 Encounter for screening for osteoporosis: Secondary | ICD-10-CM | POA: Insufficient documentation

## 2011-01-17 DIAGNOSIS — Z6837 Body mass index (BMI) 37.0-37.9, adult: Secondary | ICD-10-CM | POA: Insufficient documentation

## 2011-01-17 DIAGNOSIS — R5381 Other malaise: Secondary | ICD-10-CM

## 2011-01-17 DIAGNOSIS — E669 Obesity, unspecified: Secondary | ICD-10-CM

## 2011-01-17 DIAGNOSIS — Z139 Encounter for screening, unspecified: Secondary | ICD-10-CM

## 2011-01-17 DIAGNOSIS — G4733 Obstructive sleep apnea (adult) (pediatric): Secondary | ICD-10-CM | POA: Insufficient documentation

## 2011-01-20 NOTE — Procedures (Signed)
[name], [name]            ACCOUNT NO.:  [id number]  MEDICAL RECORD NO.:  [id number]          PATIENT TYPE:  OUT  LOCATION:  SLEEP LAB                     FACILITY:  [facility]  PHYSICIAN:  Rastus Borton A. Gerilyn Pilgrim, M.D. DATE OF BIRTH:  [birth year]  DATE OF STUDY:  01/17/2011                           NOCTURNAL POLYSOMNOGRAM  REFERRING PHYSICIAN:  Laural Golden  INDICATION FOR STUDY:  This is a 55 year old lady who presents with snoring, fatigue and restless sleep.  This study is being done to evaluate for obstructive sleep apnea syndrome.  EPWORTH SLEEPINESS SCORE: 1. BMI 37.  ARCHITECTURAL SUMMARY:  The total recording time is 441 minutes.  Sleep efficiency 92.2%.  Sleep latency 4 minutes.  REM latency 262 minutes. Stage N1 5.5%, N2 70.8%, N3 7.1% and REM sleep 16.5%.  RESPIRATORY SUMMARY:  Baseline oxygen saturation is 98%, lowest saturation 87% during non-REM sleep.  Diagnostic AHI is 5.  LIMB MOVEMENT SUMMARY:  PLM index 0.  ELECTROCARDIOGRAM SUMMARY:  Average heart rate is 68 with no significant dysrhythmias observed.  MEDICATIONS:  Meloxicam, ranitidine, hydrocodone.   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  Thank you for this referral.     Trafton Roker A. Gerilyn Pilgrim, M.D.    KAD/MEDQ  D:  01/19/2011 19:09:12  T:  01/20/2011 01:24:57  Job:  [id number]

## 2011-11-25 ENCOUNTER — Other Ambulatory Visit: Payer: Self-pay | Admitting: Family Medicine

## 2011-11-25 DIAGNOSIS — Z1231 Encounter for screening mammogram for malignant neoplasm of breast: Secondary | ICD-10-CM

## 2011-12-25 ENCOUNTER — Ambulatory Visit
Admission: RE | Admit: 2011-12-25 | Discharge: 2011-12-25 | Disposition: A | Discharge status: Home / Self Care | Payer: 59 | Source: Ambulatory Visit | Attending: Family Medicine | Admitting: Family Medicine

## 2011-12-25 DIAGNOSIS — Z1231 Encounter for screening mammogram for malignant neoplasm of breast: Secondary | ICD-10-CM

## 2012-05-01 ENCOUNTER — Other Ambulatory Visit: Payer: Self-pay | Admitting: Orthopaedic Surgery

## 2012-05-01 DIAGNOSIS — M542 Cervicalgia: Secondary | ICD-10-CM

## 2012-05-06 ENCOUNTER — Other Ambulatory Visit: Payer: 59

## 2012-05-09 ENCOUNTER — Ambulatory Visit
Admission: RE | Admit: 2012-05-09 | Discharge: 2012-05-09 | Disposition: A | Discharge status: Home / Self Care | Payer: 59 | Source: Ambulatory Visit | Attending: Orthopaedic Surgery | Admitting: Orthopaedic Surgery

## 2012-05-09 DIAGNOSIS — M542 Cervicalgia: Secondary | ICD-10-CM

## 2012-07-14 ENCOUNTER — Encounter: Payer: Self-pay | Admitting: *Deleted

## 2012-08-24 ENCOUNTER — Other Ambulatory Visit: Payer: Self-pay | Admitting: *Deleted

## 2012-08-24 MED ORDER — RANITIDINE HCL 300 MG PO TABS: 300.0000 mg | ORAL_TABLET | Freq: Every day | ORAL | Status: DC

## 2012-10-02 ENCOUNTER — Ambulatory Visit (INDEPENDENT_AMBULATORY_CARE_PROVIDER_SITE_OTHER): Payer: 59 | Admitting: Nurse Practitioner

## 2012-10-02 ENCOUNTER — Encounter: Payer: Self-pay | Admitting: Nurse Practitioner

## 2012-10-02 VITALS — BP 144/80 | Temp 98.1°F | Ht 63.0 in | Wt 229.0 lb

## 2012-10-02 DIAGNOSIS — R079 Chest pain, unspecified: Secondary | ICD-10-CM

## 2012-10-02 DIAGNOSIS — R0600 Dyspnea, unspecified: Secondary | ICD-10-CM

## 2012-10-02 DIAGNOSIS — R0609 Other forms of dyspnea: Secondary | ICD-10-CM

## 2012-10-06 ENCOUNTER — Encounter: Payer: Self-pay | Admitting: Nurse Practitioner

## 2012-10-06 DIAGNOSIS — R0600 Dyspnea, unspecified: Secondary | ICD-10-CM | POA: Insufficient documentation

## 2012-10-06 NOTE — Assessment & Plan Note (Signed)
Plan: Start daily 81 mg aspirin. Avoid extreme activity for now. Refer to cardiology for further workup. Warning signs reviewed at length. Patient to call EMS or go to the ED if symptoms recur or worsen.

## 2012-10-06 NOTE — Progress Notes (Signed)
Subjective:  Presents for an episode that occurred 2 weeks ago. Was on an intense hike with her husband. Began having shortness of breath and nausea. Patient had to slow down and stop walking. Felt bad for several hours after this. Very hot outside the patient had chills. No abdominal pain. No chest pain /ischemic type pain but felt like she couldn't get her breath. States she did not feel well. Has had no further episodes but has avoided extreme activity since that time. Once a day patient will have a sharp localized pain near the anterior flank area right side just beneath the right breast lasting a few seconds.  Unassociated with activity.no edema. No cough. No fevers. Was on Coumadin several years ago following a hip replacement as a precautionary measure. Reflux well controlled with Zantac. [facility] her father and brother had heart disease. Her sister recently had a cardiac workup which was negative.   Objective:   BP 144/80  Temp(Src) 98.1 F (36.7 C) (Oral)  Ht 5\' 3"  (1.6 m)  Wt 229 lb (103.874 kg)  BMI 40.58 kg/m2  NAD. Alert, oriented. Mildly anxious affect. Lungs clear. Heart regular rate rhythm. No murmur or gallop noted. EKG within normal limits, no changes indicating recent MI. Reviewed with Dr. Lorin Picket. Lower extremities no edema. Moderate tenderness with palpation of the right anterior/lateral chest wall near the anterior axillary line just beneath the right breast. Localized to this area.  Assessment:Dyspnea  Chest pain - Plan: PR ELECTROCARDIOGRAM, COMPLETE, PR ELECTROCARDIOGRAM, COMPLETE  Plan: Start daily 81 mg aspirin. Avoid extreme activity for now. Refer to cardiology for further workup. Warning signs reviewed at length. Patient to call EMS or go to the ED if symptoms recur or worsen.

## 2012-10-19 ENCOUNTER — Inpatient Hospital Stay (HOSPITAL_COMMUNITY)
Admission: EM | Admit: 2012-10-19 | Discharge: 2012-10-23 | DRG: 494 | Disposition: A | Discharge status: Skilled Nursing Facility | Payer: 59 | Attending: Orthopedic Surgery | Admitting: Orthopedic Surgery

## 2012-10-19 ENCOUNTER — Encounter (HOSPITAL_COMMUNITY): Payer: Self-pay

## 2012-10-19 ENCOUNTER — Emergency Department (HOSPITAL_COMMUNITY): Payer: 59

## 2012-10-19 DIAGNOSIS — Y9301 Activity, walking, marching and hiking: Secondary | ICD-10-CM

## 2012-10-19 DIAGNOSIS — I493 Ventricular premature depolarization: Secondary | ICD-10-CM

## 2012-10-19 DIAGNOSIS — W19XXXA Unspecified fall, initial encounter: Secondary | ICD-10-CM

## 2012-10-19 DIAGNOSIS — H409 Unspecified glaucoma: Secondary | ICD-10-CM | POA: Diagnosis present

## 2012-10-19 DIAGNOSIS — W010XXA Fall on same level from slipping, tripping and stumbling without subsequent striking against object, initial encounter: Secondary | ICD-10-CM | POA: Diagnosis present

## 2012-10-19 DIAGNOSIS — I872 Venous insufficiency (chronic) (peripheral): Secondary | ICD-10-CM | POA: Diagnosis present

## 2012-10-19 DIAGNOSIS — S82202A Unspecified fracture of shaft of left tibia, initial encounter for closed fracture: Secondary | ICD-10-CM

## 2012-10-19 DIAGNOSIS — Z96649 Presence of unspecified artificial hip joint: Secondary | ICD-10-CM

## 2012-10-19 DIAGNOSIS — E669 Obesity, unspecified: Secondary | ICD-10-CM | POA: Diagnosis present

## 2012-10-19 DIAGNOSIS — M899 Disorder of bone, unspecified: Secondary | ICD-10-CM | POA: Diagnosis present

## 2012-10-19 DIAGNOSIS — M129 Arthropathy, unspecified: Secondary | ICD-10-CM | POA: Diagnosis present

## 2012-10-19 DIAGNOSIS — I4949 Other premature depolarization: Secondary | ICD-10-CM | POA: Diagnosis present

## 2012-10-19 DIAGNOSIS — K219 Gastro-esophageal reflux disease without esophagitis: Secondary | ICD-10-CM | POA: Diagnosis present

## 2012-10-19 DIAGNOSIS — Z6839 Body mass index (BMI) 39.0-39.9, adult: Secondary | ICD-10-CM

## 2012-10-19 DIAGNOSIS — S82142A Displaced bicondylar fracture of left tibia, initial encounter for closed fracture: Secondary | ICD-10-CM

## 2012-10-19 DIAGNOSIS — Z01810 Encounter for preprocedural cardiovascular examination: Secondary | ICD-10-CM

## 2012-10-19 DIAGNOSIS — S82109A Unspecified fracture of upper end of unspecified tibia, initial encounter for closed fracture: Principal | ICD-10-CM | POA: Diagnosis present

## 2012-10-19 HISTORY — DX: Gastro-esophageal reflux disease without esophagitis: K21.9

## 2012-10-19 LAB — CBC WITH DIFFERENTIAL/PLATELET
Basophils Absolute: 0 10*3/uL (ref 0.0–0.1)
Basophils Relative: 0 % (ref 0–1)
Eosinophils Relative: 1 % (ref 0–5)
Lymphocytes Relative: 21 % (ref 12–46)
MCV: 93.1 fL (ref 78.0–100.0)
Platelets: 212 10*3/uL (ref 150–400)
RDW: 13 % (ref 11.5–15.5)
WBC: 9.7 10*3/uL (ref 4.0–10.5)

## 2012-10-19 LAB — BASIC METABOLIC PANEL
Calcium: 9.8 mg/dL (ref 8.4–10.5)
Creatinine, Ser: 0.94 mg/dL (ref 0.50–1.10)
GFR calc Af Amer: 76 mL/min — ABNORMAL LOW (ref >90)

## 2012-10-19 MED ORDER — SODIUM CHLORIDE 0.9 % IV BOLUS (SEPSIS)
500.0000 mL | Freq: Once | INTRAVENOUS | Status: AC
  Administered 2012-10-19: 500 mL via INTRAVENOUS

## 2012-10-19 MED ORDER — HYDROMORPHONE HCL PF 1 MG/ML IJ SOLN
1.0000 mg | Freq: Once | INTRAMUSCULAR | Status: AC
  Administered 2012-10-19: 1 mg via INTRAMUSCULAR
  Filled 2012-10-19: qty 1

## 2012-10-19 MED ORDER — ONDANSETRON HCL 4 MG/2ML IJ SOLN
4.0000 mg | Freq: Once | INTRAMUSCULAR | Status: AC
  Administered 2012-10-19: 4 mg via INTRAVENOUS
  Filled 2012-10-19: qty 2

## 2012-10-19 MED ORDER — HYDROMORPHONE HCL PF 1 MG/ML IJ SOLN
1.0000 mg | Freq: Once | INTRAMUSCULAR | Status: AC
  Administered 2012-10-19: 1 mg via INTRAVENOUS
  Filled 2012-10-19: qty 1

## 2012-10-19 MED ORDER — SODIUM CHLORIDE 0.9 % IV SOLN
Freq: Once | INTRAVENOUS | Status: AC
  Administered 2012-10-19: 19:00:00 via INTRAVENOUS

## 2012-10-19 MED ORDER — HYDROMORPHONE HCL PF 1 MG/ML IJ SOLN
0.5000 mg | Freq: Once | INTRAMUSCULAR | Status: AC
  Administered 2012-10-19: 0.5 mg via INTRAVENOUS
  Filled 2012-10-19: qty 1

## 2012-10-19 NOTE — H&P (Signed)
Reason for Consult:   Left tibial plateau fracture Referring Physician:    EDP, [name] is an 57 y.o. female  Well known to me thru hip replacements on both sides who tripped after a hike today and felt a crunch in her leg.  [facility] to [facility] in [place] and xray followed by CT c/w comminuted tibial plateau fracture.  Transferred to [facility] for management.  Denies other pains as well as CP and LOC.Marland Kitchen    Past Medical History  Diagnosis Date  . Headache   . Allergic rhinitis   . Arthritis   . Glaucoma   . Reflux   . Venous stasis     Past Surgical History  Procedure Laterality Date  . Tubal ligation    . Cholecystectomy    . Joint replacement    . Back surgery    . Shoulder surgery    . Vocal cord surgery      Family History  Problem Relation Age of Onset  . Heart attack Father   . Diabetes Brother     Social History:  reports that she has never smoked. She does not have any smokeless tobacco history on file. She reports that she does not drink alcohol or use illicit drugs.  Allergies:  Allergies  Allergen Reactions  . Prednisone   . Amoxicillin Rash    Medications: I have reviewed the patient's current medications.  Results for orders placed during the hospital encounter of 10/19/12 (from the past 48 hour(s))  BASIC METABOLIC PANEL     Status: Abnormal   Collection Time    10/19/12  7:10 PM      Result Value Range   Sodium 140  135 - 145 mEq/L   Potassium 4.3  3.5 - 5.1 mEq/L   Chloride 103  96 - 112 mEq/L   CO2 29  19 - 32 mEq/L   Glucose, Bld 124 (*) 70 - 99 mg/dL   BUN 13  6 - 23 mg/dL   Creatinine, Ser 9.81  0.50 - 1.10 mg/dL   Calcium 9.8  8.4 - 19.1 mg/dL   GFR calc non Af Amer 66 (*) >90 mL/min   GFR calc Af Amer 76 (*) >90 mL/min   Comment: (NOTE)     The eGFR has been calculated using the CKD EPI equation.     This calculation has not been validated in all clinical situations.     eGFR's persistently <90 mL/min signify  possible Chronic Kidney     Disease.  CBC WITH DIFFERENTIAL     Status: None   Collection Time    10/19/12  7:10 PM      Result Value Range   WBC 9.7  4.0 - 10.5 K/uL   RBC 4.07  3.87 - 5.11 MIL/uL   Hemoglobin 13.0  12.0 - 15.0 g/dL   HCT 47.8  29.5 - 62.1 %   MCV 93.1  78.0 - 100.0 fL   MCH 31.9  26.0 - 34.0 pg   MCHC 34.3  30.0 - 36.0 g/dL   RDW 30.8  65.7 - 84.6 %   Platelets 212  150 - 400 K/uL   Neutrophils Relative % 73  43 - 77 %   Neutro Abs 7.1  1.7 - 7.7 K/uL   Lymphocytes Relative 21  12 - 46 %   Lymphs Abs 2.0  0.7 - 4.0 K/uL   Monocytes Relative 5  3 - 12 %   Monocytes  Absolute 0.5  0.1 - 1.0 K/uL   Eosinophils Relative 1  0 - 5 %   Eosinophils Absolute 0.1  0.0 - 0.7 K/uL   Basophils Relative 0  0 - 1 %   Basophils Absolute 0.0  0.0 - 0.1 K/uL    Dg Ribs Unilateral W/chest Left  10/19/2012   CLINICAL DATA:  Fall with left chest pain.  EXAM: LEFT RIBS AND CHEST - 3+ VIEW  COMPARISON:  Chest x-ray on 06/12/2008  FINDINGS: No fracture or other bone lesions are seen involving the ribs. There is no evidence of pneumothorax or pleural effusion. Both lungs are clear. Heart size and mediastinal contours are within normal limits.  IMPRESSION: No acute left rib fracture identified.   Electronically Signed   By: Irish Lack   On: 10/19/2012 17:44   Ct Knee Left Wo Contrast  10/19/2012   CLINICAL DATA:  Patient with a tibial plateau fracture. This is for further delineation.  EXAM: CT OF THE LEFT KNEE WITHOUT CONTRAST  TECHNIQUE: Multidetector CT imaging was performed according to the standard protocol. Multiplanar CT image reconstructions were also generated.  COMPARISON:  Standard radiographs, 10/19/2012  FINDINGS: There is a complex, comminuted fracture of the proximal tibia. The primary fracture line is along the sagittal plane, slightly oblique, extending from below the tibial spine to the anterior lateral aspect of the proximal tibia at the metadiaphysis. At the articular  surface, this fracture is displaced laterally by 7 mm. Comminuted fracture fragments are depressed centrally, 1 cm inferiorly from the expected location of the lateral tibial plateau articular surface. A another transverse oblique fracture line extends across the medial tibial metaphysis. Fracture lines also undermined the base of the tibial spine and intersect the medial margin of the medial articular surface.  No femur or fibular fracture. There is no patellar fracture. A moderate joint effusion with a fat fluid level is noted. Both the cruciate ligament insertions likely attached to fracture bony fragments.  IMPRESSION: Extensively comminuted, mildly displaced fracture of the proximal left tibia. The fracture primarily involves the lateral tibial plateau with depression of the central articular service by in maximal 1 cm. Fracture extends inferiorly to the mid-diaphysis and there is a more simple appearing nondisplaced fracture of the medial tibial metaphysis.   Electronically Signed   By: Amie Portland   On: 10/19/2012 20:13   Dg Knee Complete 4 Views Left  10/19/2012   CLINICAL DATA:  Fall with left knee pain.  EXAM: LEFT KNEE - COMPLETE 4+ VIEW  COMPARISON:  None.  FINDINGS: There is evidence of an acute fracture of the proximal tibia involving the central joint space and extending into the lateral tibial plateau and lateral metaphysis. Comminution and displacement noted with the greatest degree of measurable displacement of approximately 7 mm. Some joint fluid is present.  IMPRESSION: Comminuted proximal tibial fracture involving the central joint space and extending into the lateral tibial plateau and lateral metaphysis.   Electronically Signed   By: Irish Lack   On: 10/19/2012 17:48    @ROS @ Blood pressure 114/54, pulse 89, temperature 98.1 F (36.7 C), temperature source Oral, resp. rate 18, height 5\' 3"  (1.6 m), weight 99.791 kg (220 lb), SpO2 94.00%.  PHYSICAL EXAM:   ABD  soft Neurovascular intact Sensation intact distally Intact pulses distally Dorsiflexion/Plantar flexion intact Compartment soft throughout though leg is swollen No pain on passive stretch of toes in either direction with mild/moderate pain on ankle ROM Skin looks fine  ASSESSMENT:   Left tibial plateau fracture  PLAN:   Needs ORIF.  Admit now for ice and elevation.  Dr Carola Frost to see in AM.  Potential surgery tomorrow vs delay for swelling to go down.     Monicka Cyran G 10/19/2012, 11:39 PM

## 2012-10-19 NOTE — ED Provider Notes (Signed)
CSN: [id number]     Arrival date & time 10/19/12  1641 History  This chart was scribed for Cassandra Hutching, MD by Arlan Organ, ED Scribe. This patient was seen in room [facility] and the patient's care was started 5:12 PM.    Chief Complaint  Patient presents with  . Fall   The history is provided by the patient. No language interpreter was used.   HPI Comments: Level V caveat for urgent need for intervention [name] is a 57 y.o. female who presents to the Emergency Department complaining of a fall that occurred earlier today. Pt states she was walking on a trail with her relative, when she tripped and landed on her left side in the yard. Pt now complains of constant and unchanged left knee pain and left lateral chest wall pain. She states that movements worsens the pain in her left knee. Per pt, she has a history of a ruptured disc and bilateral hips replacemeant. Pt denies head injury, neck pain, SOB, and HA. Severity is moderate.   Past Medical History  Diagnosis Date  . Headache   . Allergic rhinitis   . Arthritis   . Glaucoma   . Reflux   . Venous stasis    Past Surgical History  Procedure Laterality Date  . Tubal ligation    . Cholecystectomy    . Joint replacement    . Back surgery    . Shoulder surgery    . Vocal cord surgery     Family History  Problem Relation Age of Onset  . Heart attack Father   . Diabetes Brother    History  Substance Use Topics  . Smoking status: Never Smoker   . Smokeless tobacco: Not on file  . Alcohol Use: No   OB History   Grav Para Term Preterm Abortions TAB SAB Ect Mult Living                 Review of Systems  Unable to perform ROS: Acuity of condition   A complete 10 system review of systems was obtained and all systems are negative except as noted in the HPI and PMH.   Allergies  Prednisone and Amoxicillin  Home Medications   Current Outpatient Rx  Name  Route  Sig  Dispense  Refill  .  HYDROcodone-acetaminophen (NORCO/VICODIN) 5-325 MG per tablet   Oral   Take 1 tablet by mouth every 8 (eight) hours as needed for pain.          . meloxicam (MOBIC) 15 MG tablet   Oral   Take 15 mg by mouth daily.          . ranitidine (ZANTAC) 300 MG tablet   Oral   Take 1 tablet (300 mg total) by mouth daily.   90 tablet   1   . traMADol (ULTRAM) 50 MG tablet   Oral   Take 50 mg by mouth every 6 (six) hours as needed for pain.          . TRAVATAN Z 0.004 % SOLN ophthalmic solution   Both Eyes   Place 1 drop into both eyes at bedtime.           BP 131/65  Pulse 89  Temp(Src) 98.7 F (37.1 C) (Oral)  Resp 22  Ht 5\' 3"  (1.6 m)  Wt 220 lb (99.791 kg)  BMI 38.98 kg/m2  SpO2 99% Physical Exam  Nursing note and vitals reviewed. Constitutional: She is oriented to  person, place, and time. She appears well-developed and well-nourished.  HENT:  Head: Normocephalic and atraumatic.  Eyes: Conjunctivae and EOM are normal. Pupils are equal, round, and reactive to light.  Neck: Normal range of motion. Neck supple.  Cardiovascular: Normal rate, regular rhythm and normal heart sounds.   Pulmonary/Chest: Effort normal and breath sounds normal. She exhibits tenderness.  Tenderness to palpation over left lateral chest wall  Abdominal: Soft. Bowel sounds are normal.  Musculoskeletal: Normal range of motion. She exhibits tenderness.  Tenderness to palpation over the left anterior knee. Pain with any ROM to left knee.  Neurological: She is alert and oriented to person, place, and time.  Skin: Skin is warm and dry.  Psychiatric: She has a normal mood and affect.    ED Course  Procedures (including critical care time)  DIAGNOSTIC STUDIES: Oxygen Saturation is 99% on RA, Normal by my interpretation.    COORDINATION OF CARE: 5:12 PM- Will order X-ray of ribs with left chest and left knee. Will give dilaudid for pain. Discussed treatment plan with pt at bedside and pt agreed to  plan.     Labs Review Labs Reviewed - No data to display Imaging Review No results found. Results for orders placed during the hospital encounter of 05/15/10  CBC      Result Value Range   WBC 8.7  4.0 - 10.5 K/uL   RBC 3.48 (*) 3.87 - 5.11 MIL/uL   Hemoglobin 10.3 (*) 12.0 - 15.0 g/dL   HCT 16.1 (*) 09.6 - 04.5 %   MCV 89.9  78.0 - 100.0 fL   MCH 29.6  26.0 - 34.0 pg   MCHC 32.9  30.0 - 36.0 g/dL   RDW 40.9  81.1 - 91.4 %   Platelets 203  150 - 400 K/uL  PROTIME-INR      Result Value Range   Prothrombin Time 14.1  11.6 - 15.2 seconds   INR 1.07  0.00 - 1.49  BASIC METABOLIC PANEL      Result Value Range   Sodium 137  135 - 145 mEq/L   Potassium 5.0  3.5 - 5.1 mEq/L   Chloride 102  96 - 112 mEq/L   CO2 32  19 - 32 mEq/L   Glucose, Bld 146 (*) 70 - 99 mg/dL   BUN 4 (*) 6 - 23 mg/dL   Creatinine, Ser 7.82  0.4 - 1.2 mg/dL   Calcium 8.5  8.4 - 95.6 mg/dL   GFR calc non Af Amer >60  >60 mL/min   GFR calc Af Amer    >60 mL/min   Value: >60            The eGFR has been calculated     using the MDRD equation.     This calculation has not been     validated in all clinical     situations.     eGFR's persistently     <60 mL/min signify     possible Chronic Kidney Disease.  CBC      Result Value Range   WBC 14.6 (*) 4.0 - 10.5 K/uL   RBC 3.37 (*) 3.87 - 5.11 MIL/uL   Hemoglobin 10.1 (*) 12.0 - 15.0 g/dL   HCT 21.3 (*) 08.6 - 57.8 %   MCV 88.1  78.0 - 100.0 fL   MCH 30.0  26.0 - 34.0 pg   MCHC 34.0  30.0 - 36.0 g/dL   RDW 46.9  62.9 - 52.8 %  Platelets 231  150 - 400 K/uL  PROTIME-INR      Result Value Range   Prothrombin Time 20.4 (*) 11.6 - 15.2 seconds   INR 1.73 (*) 0.00 - 1.49  BASIC METABOLIC PANEL      Result Value Range   Sodium 138  135 - 145 mEq/L   Potassium 3.8 DELTA CHECK NOTED NO VISIBLE HEMOLYSIS  3.5 - 5.1 mEq/L   Chloride 102  96 - 112 mEq/L   CO2 28  19 - 32 mEq/L   Glucose, Bld 129 (*) 70 - 99 mg/dL   BUN 6  6 - 23 mg/dL   Creatinine, Ser  1.61  0.4 - 1.2 mg/dL   Calcium 8.9  8.4 - 09.6 mg/dL   GFR calc non Af Amer >60  >60 mL/min   GFR calc Af Amer    >60 mL/min   Value: >60            The eGFR has been calculated     using the MDRD equation.     This calculation has not been     validated in all clinical     situations.     eGFR's persistently     <60 mL/min signify     possible Chronic Kidney Disease.  CBC      Result Value Range   WBC 8.6  4.0 - 10.5 K/uL   RBC 2.85 (*) 3.87 - 5.11 MIL/uL   Hemoglobin 8.5 (*) 12.0 - 15.0 g/dL   HCT 04.5 (*) 40.9 - 81.1 %   MCV 90.2  78.0 - 100.0 fL   MCH 29.8  26.0 - 34.0 pg   MCHC 33.1  30.0 - 36.0 g/dL   RDW 91.4  78.2 - 95.6 %   Platelets 182  150 - 400 K/uL  PROTIME-INR      Result Value Range   Prothrombin Time 33.2 (*) 11.6 - 15.2 seconds   INR 3.25 (*) 0.00 - 1.49  BASIC METABOLIC PANEL      Result Value Range   Sodium 137  135 - 145 mEq/L   Potassium 4.3  3.5 - 5.1 mEq/L   Chloride 101  96 - 112 mEq/L   CO2 32  19 - 32 mEq/L   Glucose, Bld 106 (*) 70 - 99 mg/dL   BUN 6  6 - 23 mg/dL   Creatinine, Ser 2.13  0.4 - 1.2 mg/dL   Calcium 8.3 (*) 8.4 - 10.5 mg/dL   GFR calc non Af Amer >60  >60 mL/min   GFR calc Af Amer    >60 mL/min   Value: >60            The eGFR has been calculated     using the MDRD equation.     This calculation has not been     validated in all clinical     situations.     eGFR's persistently     <60 mL/min signify     possible Chronic Kidney Disease.  TYPE AND SCREEN      Result Value Range   ABO/RH(D) O NEG     Antibody Screen NEG     Sample Expiration 05/18/2010    ABO/RH      Result Value Range   ABO/RH(D) O NEG     Dg Ribs Unilateral W/chest Left  10/19/2012   CLINICAL DATA:  Fall with left chest pain.  EXAM: LEFT RIBS AND CHEST - 3+ VIEW  COMPARISON:  Chest x-ray on 06/12/2008  FINDINGS: No fracture or other bone lesions are seen involving the ribs. There is no evidence of pneumothorax or pleural effusion. Both lungs are  clear. Heart size and mediastinal contours are within normal limits.  IMPRESSION: No acute left rib fracture identified.   Electronically Signed   By: Irish Lack   On: 10/19/2012 17:44   Dg Knee Complete 4 Views Left  10/19/2012   CLINICAL DATA:  Fall with left knee pain.  EXAM: LEFT KNEE - COMPLETE 4+ VIEW  COMPARISON:  None.  FINDINGS: There is evidence of an acute fracture of the proximal tibia involving the central joint space and extending into the lateral tibial plateau and lateral metaphysis. Comminution and displacement noted with the greatest degree of measurable displacement of approximately 7 mm. Some joint fluid is present.  IMPRESSION: Comminuted proximal tibial fracture involving the central joint space and extending into the lateral tibial plateau and lateral metaphysis.   Electronically Signed   By: Irish Lack   On: 10/19/2012 17:48     MDM  No diagnosis found. CT scan of left knee reveals a comminuted displaced fracture of the proximal left tibia. These findings were discussed with Dr. Jerl Santos in [place].   Patient will be transferred to [facility]  I personally performed the services described in this documentation, which was scribed in my presence. The recorded information has been reviewed and is accurate.    Cassandra Hutching, MD 10/19/12 2134

## 2012-10-19 NOTE — ED Notes (Signed)
Pt reports was working in the yard, tripped over a root, and fell.  C/O pain to left knee and left rib area.  Denies hitting head or any LOC.  Pt says left knee hurts the most.

## 2012-10-19 NOTE — ED Notes (Signed)
Patient complaining of returning pain. Advised MD. Verbal order for dilaudid 1mg , IV.

## 2012-10-19 NOTE — ED Notes (Signed)
Placed ice pack x 2 on lower left leg. Patient tolerated well.

## 2012-10-20 ENCOUNTER — Inpatient Hospital Stay (HOSPITAL_COMMUNITY): Payer: 59

## 2012-10-20 ENCOUNTER — Encounter (HOSPITAL_COMMUNITY): Payer: Self-pay | Admitting: Anesthesiology

## 2012-10-20 ENCOUNTER — Inpatient Hospital Stay (HOSPITAL_COMMUNITY): Payer: 59 | Admitting: Anesthesiology

## 2012-10-20 ENCOUNTER — Encounter (HOSPITAL_COMMUNITY): Payer: Self-pay | Admitting: Orthopedic Surgery

## 2012-10-20 ENCOUNTER — Encounter (HOSPITAL_COMMUNITY)
Admission: EM | Disposition: A | Discharge status: Skilled Nursing Facility | Payer: Self-pay | Source: Home / Self Care | Attending: Orthopedic Surgery

## 2012-10-20 DIAGNOSIS — R0602 Shortness of breath: Secondary | ICD-10-CM

## 2012-10-20 DIAGNOSIS — Z01810 Encounter for preprocedural cardiovascular examination: Secondary | ICD-10-CM

## 2012-10-20 DIAGNOSIS — K219 Gastro-esophageal reflux disease without esophagitis: Secondary | ICD-10-CM | POA: Diagnosis present

## 2012-10-20 DIAGNOSIS — E669 Obesity, unspecified: Secondary | ICD-10-CM | POA: Diagnosis present

## 2012-10-20 DIAGNOSIS — S82142A Displaced bicondylar fracture of left tibia, initial encounter for closed fracture: Secondary | ICD-10-CM

## 2012-10-20 DIAGNOSIS — W19XXXA Unspecified fall, initial encounter: Secondary | ICD-10-CM

## 2012-10-20 HISTORY — PX: EXTERNAL FIXATION LEG: SHX1549

## 2012-10-20 SURGERY — EXTERNAL FIXATION, LOWER EXTREMITY
Anesthesia: General | Site: Leg Lower | Laterality: Left | Wound class: Clean

## 2012-10-20 MED ORDER — BISACODYL 10 MG RE SUPP: 10.0000 mg | Freq: Every day | RECTAL | Status: DC | PRN

## 2012-10-20 MED ORDER — VANCOMYCIN HCL IN DEXTROSE 1-5 GM/200ML-% IV SOLN
1000.0000 mg | Freq: Two times a day (BID) | INTRAVENOUS | Status: AC
  Administered 2012-10-20: 1000 mg via INTRAVENOUS
  Filled 2012-10-20: qty 200

## 2012-10-20 MED ORDER — PROPOFOL 10 MG/ML IV BOLUS
INTRAVENOUS | Status: DC | PRN
  Administered 2012-10-20: 200 mg via INTRAVENOUS
  Administered 2012-10-20: 30 mg via INTRAVENOUS

## 2012-10-20 MED ORDER — HYDROCODONE-ACETAMINOPHEN 5-325 MG PO TABS
1.0000 | ORAL_TABLET | Freq: Four times a day (QID) | ORAL | Status: DC | PRN
  Administered 2012-10-20 (×2): 1 via ORAL
  Administered 2012-10-21 (×2): 2 via ORAL
  Administered 2012-10-21: 1 via ORAL
  Administered 2012-10-21 – 2012-10-23 (×7): 2 via ORAL
  Filled 2012-10-20 (×2): qty 2
  Filled 2012-10-20: qty 1
  Filled 2012-10-20 (×3): qty 2
  Filled 2012-10-20 (×2): qty 1
  Filled 2012-10-20 (×4): qty 2

## 2012-10-20 MED ORDER — GLYCOPYRROLATE 0.2 MG/ML IJ SOLN
INTRAMUSCULAR | Status: DC | PRN
  Administered 2012-10-20: 0.3 mg via INTRAVENOUS
  Administered 2012-10-20: 0.1 mg via INTRAVENOUS
  Administered 2012-10-20: 0.4 mg via INTRAVENOUS

## 2012-10-20 MED ORDER — HYDROMORPHONE HCL PF 1 MG/ML IJ SOLN
0.5000 mg | INTRAMUSCULAR | Status: DC | PRN
  Administered 2012-10-20 – 2012-10-21 (×2): 0.5 mg via INTRAVENOUS
  Filled 2012-10-20 (×2): qty 1

## 2012-10-20 MED ORDER — ACETAMINOPHEN 325 MG PO TABS: 325.0000 mg | ORAL_TABLET | Freq: Four times a day (QID) | ORAL | Status: DC | PRN

## 2012-10-20 MED ORDER — LIDOCAINE HCL (CARDIAC) 20 MG/ML IV SOLN
INTRAVENOUS | Status: DC | PRN
  Administered 2012-10-20: 100 mg via INTRAVENOUS

## 2012-10-20 MED ORDER — LACTATED RINGERS IV SOLN
INTRAVENOUS | Status: DC
  Administered 2012-10-20: 15:00:00 via INTRAVENOUS

## 2012-10-20 MED ORDER — ENOXAPARIN SODIUM 40 MG/0.4ML ~~LOC~~ SOLN: 40.0000 mg | SUBCUTANEOUS | Status: DC

## 2012-10-20 MED ORDER — FENTANYL CITRATE 0.05 MG/ML IJ SOLN
INTRAMUSCULAR | Status: DC | PRN
  Administered 2012-10-20: 100 ug via INTRAVENOUS
  Administered 2012-10-20: 50 ug via INTRAVENOUS
  Administered 2012-10-20: 100 ug via INTRAVENOUS

## 2012-10-20 MED ORDER — ARTIFICIAL TEARS OP OINT
TOPICAL_OINTMENT | OPHTHALMIC | Status: DC | PRN
  Administered 2012-10-20: 1 via OPHTHALMIC

## 2012-10-20 MED ORDER — NEOSTIGMINE METHYLSULFATE 1 MG/ML IJ SOLN
INTRAMUSCULAR | Status: DC | PRN
  Administered 2012-10-20: 2 mg via INTRAVENOUS
  Administered 2012-10-20: 1 mg via INTRAVENOUS
  Administered 2012-10-20: 2 mg via INTRAVENOUS

## 2012-10-20 MED ORDER — ALBUTEROL SULFATE HFA 108 (90 BASE) MCG/ACT IN AERS
INHALATION_SPRAY | RESPIRATORY_TRACT | Status: DC | PRN
  Administered 2012-10-20: 4 via RESPIRATORY_TRACT

## 2012-10-20 MED ORDER — LACTATED RINGERS IV SOLN
INTRAVENOUS | Status: DC | PRN
  Administered 2012-10-20 (×2): via INTRAVENOUS

## 2012-10-20 MED ORDER — SODIUM CHLORIDE 0.9 % IV SOLN
INTRAVENOUS | Status: DC
  Administered 2012-10-20: via INTRAVENOUS

## 2012-10-20 MED ORDER — MAGNESIUM HYDROXIDE 400 MG/5ML PO SUSP: 30.0000 mL | Freq: Every day | ORAL | Status: DC | PRN

## 2012-10-20 MED ORDER — HYDROMORPHONE HCL PF 1 MG/ML IJ SOLN
INTRAMUSCULAR | Status: AC
  Filled 2012-10-20: qty 1

## 2012-10-20 MED ORDER — METOCLOPRAMIDE HCL 5 MG PO TABS
5.0000 mg | ORAL_TABLET | Freq: Three times a day (TID) | ORAL | Status: DC | PRN
  Filled 2012-10-20: qty 2

## 2012-10-20 MED ORDER — HYDROMORPHONE HCL PF 1 MG/ML IJ SOLN
0.5000 mg | INTRAMUSCULAR | Status: DC | PRN
  Administered 2012-10-20 (×6): 1 mg via INTRAVENOUS
  Filled 2012-10-20 (×6): qty 1

## 2012-10-20 MED ORDER — PANTOPRAZOLE SODIUM 40 MG IV SOLR
40.0000 mg | INTRAVENOUS | Status: DC
  Administered 2012-10-20 – 2012-10-21 (×2): 40 mg via INTRAVENOUS
  Filled 2012-10-20 (×3): qty 40

## 2012-10-20 MED ORDER — SODIUM CHLORIDE 0.9 % IR SOLN
Status: DC | PRN
  Administered 2012-10-20: 1000 mL

## 2012-10-20 MED ORDER — ROCURONIUM BROMIDE 100 MG/10ML IV SOLN
INTRAVENOUS | Status: DC | PRN
  Administered 2012-10-20: 50 mg via INTRAVENOUS
  Administered 2012-10-20: 20 mg via INTRAVENOUS

## 2012-10-20 MED ORDER — HYDROMORPHONE HCL PF 1 MG/ML IJ SOLN: 1.0000 mg | INTRAMUSCULAR | Status: DC | PRN

## 2012-10-20 MED ORDER — CEFAZOLIN SODIUM-DEXTROSE 2-3 GM-% IV SOLR
2.0000 g | Freq: Once | INTRAVENOUS | Status: AC
  Administered 2012-10-20: .25 g via INTRAVENOUS
  Administered 2012-10-20: 1.75 g via INTRAVENOUS

## 2012-10-20 MED ORDER — METHOCARBAMOL 100 MG/ML IJ SOLN
500.0000 mg | Freq: Four times a day (QID) | INTRAVENOUS | Status: DC | PRN
  Administered 2012-10-20 – 2012-10-21 (×2): 500 mg via INTRAVENOUS
  Filled 2012-10-20 (×3): qty 5

## 2012-10-20 MED ORDER — METOCLOPRAMIDE HCL 5 MG/ML IJ SOLN
5.0000 mg | Freq: Three times a day (TID) | INTRAMUSCULAR | Status: DC | PRN
  Filled 2012-10-20: qty 2

## 2012-10-20 MED ORDER — HYDROMORPHONE HCL PF 1 MG/ML IJ SOLN
0.2500 mg | INTRAMUSCULAR | Status: DC | PRN
  Administered 2012-10-20: 0.5 mg via INTRAVENOUS

## 2012-10-20 MED ORDER — DOCUSATE SODIUM 100 MG PO CAPS
100.0000 mg | ORAL_CAPSULE | Freq: Two times a day (BID) | ORAL | Status: DC
  Administered 2012-10-20 – 2012-10-23 (×6): 100 mg via ORAL
  Filled 2012-10-20 (×8): qty 1

## 2012-10-20 MED ORDER — METHOCARBAMOL 500 MG PO TABS
500.0000 mg | ORAL_TABLET | Freq: Four times a day (QID) | ORAL | Status: DC | PRN
  Administered 2012-10-21 – 2012-10-23 (×6): 500 mg via ORAL
  Filled 2012-10-20 (×7): qty 1

## 2012-10-20 MED ORDER — ONDANSETRON HCL 4 MG/2ML IJ SOLN: 4.0000 mg | Freq: Three times a day (TID) | INTRAMUSCULAR | Status: AC | PRN

## 2012-10-20 MED ORDER — ARTIFICIAL TEARS OP OINT: TOPICAL_OINTMENT | OPHTHALMIC | Status: DC | PRN

## 2012-10-20 MED ORDER — CEFAZOLIN SODIUM-DEXTROSE 2-3 GM-% IV SOLR
INTRAVENOUS | Status: AC
  Filled 2012-10-20: qty 50

## 2012-10-20 MED ORDER — CHLORHEXIDINE GLUCONATE 4 % EX LIQD
60.0000 mL | Freq: Once | CUTANEOUS | Status: DC
  Filled 2012-10-20: qty 60

## 2012-10-20 MED ORDER — DIPHENHYDRAMINE HCL 12.5 MG/5ML PO ELIX
12.5000 mg | ORAL_SOLUTION | ORAL | Status: DC | PRN
  Filled 2012-10-20: qty 10

## 2012-10-20 MED ORDER — ONDANSETRON HCL 4 MG/2ML IJ SOLN
INTRAMUSCULAR | Status: DC | PRN
  Administered 2012-10-20: 4 mg via INTRAVENOUS

## 2012-10-20 MED ORDER — ONDANSETRON HCL 4 MG/2ML IJ SOLN: 4.0000 mg | Freq: Four times a day (QID) | INTRAMUSCULAR | Status: DC | PRN

## 2012-10-20 MED ORDER — POTASSIUM CHLORIDE IN NACL 20-0.9 MEQ/L-% IV SOLN
INTRAVENOUS | Status: DC
  Administered 2012-10-20 – 2012-10-21 (×2): via INTRAVENOUS
  Filled 2012-10-20 (×6): qty 1000

## 2012-10-20 MED ORDER — ONDANSETRON HCL 4 MG PO TABS: 4.0000 mg | ORAL_TABLET | Freq: Four times a day (QID) | ORAL | Status: DC | PRN

## 2012-10-20 MED ORDER — METOPROLOL TARTRATE 12.5 MG HALF TABLET
12.5000 mg | ORAL_TABLET | Freq: Two times a day (BID) | ORAL | Status: DC
  Administered 2012-10-20 – 2012-10-23 (×7): 12.5 mg via ORAL
  Filled 2012-10-20 (×10): qty 1

## 2012-10-20 MED ORDER — PROPOFOL INFUSION 10 MG/ML OPTIME
INTRAVENOUS | Status: DC | PRN
  Administered 2012-10-20: 25 ug/kg/min via INTRAVENOUS

## 2012-10-20 MED ORDER — ONDANSETRON HCL 4 MG/2ML IJ SOLN: 4.0000 mg | Freq: Once | INTRAMUSCULAR | Status: DC | PRN

## 2012-10-20 SURGICAL SUPPLY — 85 items
BANDAGE ELASTIC 4 VELCRO ST LF (GAUZE/BANDAGES/DRESSINGS) ×3 IMPLANT
BANDAGE ELASTIC 6 VELCRO ST LF (GAUZE/BANDAGES/DRESSINGS) ×3 IMPLANT
BANDAGE ESMARK 6X9 LF (GAUZE/BANDAGES/DRESSINGS) ×2 IMPLANT
BANDAGE GAUZE ELAST BULKY 4 IN (GAUZE/BANDAGES/DRESSINGS) ×3 IMPLANT
BAR GLASS FIBER EXFX 11X250 (MISCELLANEOUS) ×3 IMPLANT
BAR GLASS FIBER EXFX 11X400 (EXFIX) ×6 IMPLANT
BLADE SURG 10 STRL SS (BLADE) ×3 IMPLANT
BLADE SURG 15 STRL LF DISP TIS (BLADE) ×2 IMPLANT
BLADE SURG 15 STRL SS (BLADE) ×1
BLADE SURG ROTATE 9660 (MISCELLANEOUS) IMPLANT
BNDG COHESIVE 4X5 TAN STRL (GAUZE/BANDAGES/DRESSINGS) ×3 IMPLANT
BNDG COHESIVE 6X5 TAN STRL LF (GAUZE/BANDAGES/DRESSINGS) ×3 IMPLANT
BNDG ESMARK 6X9 LF (GAUZE/BANDAGES/DRESSINGS) ×3
BRUSH SCRUB DISP (MISCELLANEOUS) ×6 IMPLANT
CLAMP BLUE BAR TO BAR (MISCELLANEOUS) ×3 IMPLANT
CLAMP BLUE BAR TO PIN (MISCELLANEOUS) ×6 IMPLANT
CLEANER TIP ELECTROSURG 2X2 (MISCELLANEOUS) ×3 IMPLANT
CLOTH BEACON ORANGE TIMEOUT ST (SAFETY) ×3 IMPLANT
COVER MAYO STAND STRL (DRAPES) ×3 IMPLANT
COVER SURGICAL LIGHT HANDLE (MISCELLANEOUS) ×6 IMPLANT
CUFF TOURNIQUET SINGLE 18IN (TOURNIQUET CUFF) IMPLANT
CUFF TOURNIQUET SINGLE 24IN (TOURNIQUET CUFF) IMPLANT
CUFF TOURNIQUET SINGLE 34IN LL (TOURNIQUET CUFF) IMPLANT
DRAPE C-ARM 42X72 X-RAY (DRAPES) ×3 IMPLANT
DRAPE C-ARMOR (DRAPES) ×3 IMPLANT
DRAPE INCISE IOBAN 66X45 STRL (DRAPES) ×3 IMPLANT
DRAPE ORTHO SPLIT 77X108 STRL (DRAPES)
DRAPE SURG ORHT 6 SPLT 77X108 (DRAPES) IMPLANT
DRAPE U-SHAPE 47X51 STRL (DRAPES) ×3 IMPLANT
DRSG ADAPTIC 3X8 NADH LF (GAUZE/BANDAGES/DRESSINGS) ×3 IMPLANT
DRSG PAD ABDOMINAL 8X10 ST (GAUZE/BANDAGES/DRESSINGS) ×12 IMPLANT
ELECT REM PT RETURN 9FT ADLT (ELECTROSURGICAL) ×3
ELECTRODE REM PT RTRN 9FT ADLT (ELECTROSURGICAL) ×2 IMPLANT
EVACUATOR 1/8 PVC DRAIN (DRAIN) IMPLANT
EVACUATOR 3/16  PVC DRAIN (DRAIN)
EVACUATOR 3/16 PVC DRAIN (DRAIN) IMPLANT
GLOVE BIO SURGEON STRL SZ7.5 (GLOVE) ×3 IMPLANT
GLOVE BIO SURGEON STRL SZ8 (GLOVE) ×3 IMPLANT
GLOVE BIOGEL PI IND STRL 7.5 (GLOVE) ×2 IMPLANT
GLOVE BIOGEL PI IND STRL 8 (GLOVE) ×2 IMPLANT
GLOVE BIOGEL PI INDICATOR 7.5 (GLOVE) ×1
GLOVE BIOGEL PI INDICATOR 8 (GLOVE) ×1
GOWN PREVENTION PLUS XLARGE (GOWN DISPOSABLE) ×3 IMPLANT
GOWN STRL NON-REIN LRG LVL3 (GOWN DISPOSABLE) ×6 IMPLANT
HANDPIECE INTERPULSE COAX TIP (DISPOSABLE)
IMMOBILIZER KNEE 22 UNIV (SOFTGOODS) ×3 IMPLANT
KIT BASIN OR (CUSTOM PROCEDURE TRAY) ×3 IMPLANT
KIT ROOM TURNOVER OR (KITS) ×3 IMPLANT
MANIFOLD NEPTUNE II (INSTRUMENTS) ×3 IMPLANT
NDL SUT 6 .5 CRC .975X.05 MAYO (NEEDLE) IMPLANT
NEEDLE 22X1 1/2 (OR ONLY) (NEEDLE) IMPLANT
NEEDLE MAYO TAPER (NEEDLE)
NS IRRIG 1000ML POUR BTL (IV SOLUTION) ×3 IMPLANT
PACK ORTHO EXTREMITY (CUSTOM PROCEDURE TRAY) ×3 IMPLANT
PAD ARMBOARD 7.5X6 YLW CONV (MISCELLANEOUS) ×6 IMPLANT
PAD CAST 4YDX4 CTTN HI CHSV (CAST SUPPLIES) ×2 IMPLANT
PADDING CAST COTTON 4X4 STRL (CAST SUPPLIES) ×1
PADDING CAST COTTON 6X4 STRL (CAST SUPPLIES) ×9 IMPLANT
PIN CLAMP 2BAR 75MM BLUE (PIN) ×3 IMPLANT
PIN HALF YELLOW 5X160X35 (PIN) ×3 IMPLANT
SET HNDPC FAN SPRY TIP SCT (DISPOSABLE) IMPLANT
SPONGE GAUZE 4X4 12PLY (GAUZE/BANDAGES/DRESSINGS) ×3 IMPLANT
SPONGE LAP 18X18 X RAY DECT (DISPOSABLE) ×3 IMPLANT
SPONGE SCRUB IODOPHOR (GAUZE/BANDAGES/DRESSINGS) ×3 IMPLANT
STAPLER VISISTAT 35W (STAPLE) ×3 IMPLANT
STOCKINETTE IMPERVIOUS LG (DRAPES) ×3 IMPLANT
STRIP CLOSURE SKIN 1/2X4 (GAUZE/BANDAGES/DRESSINGS) IMPLANT
SUCTION FRAZIER TIP 10 FR DISP (SUCTIONS) ×3 IMPLANT
SUT ETHILON 3 0 PS 1 (SUTURE) IMPLANT
SUT PROLENE 0 CT 2 (SUTURE) ×6 IMPLANT
SUT VIC AB 0 CT1 27 (SUTURE) ×2
SUT VIC AB 0 CT1 27XBRD ANBCTR (SUTURE) ×4 IMPLANT
SUT VIC AB 1 CT1 27 (SUTURE) ×1
SUT VIC AB 1 CT1 27XBRD ANBCTR (SUTURE) ×2 IMPLANT
SUT VIC AB 2-0 CT1 27 (SUTURE) ×2
SUT VIC AB 2-0 CT1 TAPERPNT 27 (SUTURE) ×4 IMPLANT
SYR 20ML ECCENTRIC (SYRINGE) IMPLANT
SYR CONTROL 10ML LL (SYRINGE) IMPLANT
TOWEL OR 17X24 6PK STRL BLUE (TOWEL DISPOSABLE) ×6 IMPLANT
TOWEL OR 17X26 10 PK STRL BLUE (TOWEL DISPOSABLE) ×6 IMPLANT
TRAY FOLEY CATH 16FRSI W/METER (SET/KITS/TRAYS/PACK) IMPLANT
TUBE CONNECTING 12X1/4 (SUCTIONS) ×3 IMPLANT
UNDERPAD 30X30 INCONTINENT (UNDERPADS AND DIAPERS) ×3 IMPLANT
WATER STERILE IRR 1000ML POUR (IV SOLUTION) ×6 IMPLANT
YANKAUER SUCT BULB TIP NO VENT (SUCTIONS) ×3 IMPLANT

## 2012-10-20 NOTE — Progress Notes (Signed)
Patient able to lift head for 5 seconds, take a deep breath on own, and has moderate hand grips. Patient opens her eyes. Patient was extubated per Everlene Balls, CRNA, and suctioned. Patient tolerated well. Patient placed on a Nasal Cannula at 4L o2 sats at 93%. VSS.  will continue to monitor.

## 2012-10-20 NOTE — Consult Note (Signed)
This morning at 10:15am, I saw and examined the patient. I agree with the findings above.  Significantly displaced left tibial plateau fracture, with depression and valgus Intact   Sens DPN, SPN, TN intact  Motor EHL, ext, flex, evers grossly intact  DP 2+, PT 2+  Significant swelling, no wrinkling  Valgus deformity  PLAN: card eval complete and cleared for proceeding  Ex-fix today  ORIF anticipated in two weeks if soft tissues allow  Lovenox  Erhardt Dada H, MD 10/20/2012 2:54 PM

## 2012-10-20 NOTE — Progress Notes (Signed)
Patient came to PACU still intubated. Settings of the vent set by Respiratory Therapist. PEEP 5, FiO2 50, PS above PEEP of 10. Propofol drip started per Christine Hayes CRNA. Keith Paul PA notified of patients condition and new room orders received. Patient resting comfortably. 

## 2012-10-20 NOTE — Preoperative (Signed)
Beta Blockers   Reason not to administer Beta Blockers:Not Applicable 

## 2012-10-20 NOTE — Transfer of Care (Signed)
Immediate Anesthesia Transfer of Care Note  Patient: [name]  Procedure(s) Performed: Procedure(s): EXTERNAL FIXATION Left Tibial Plateau Fx (Left)  Patient Location: PACU  Anesthesia Type:General  Level of Consciousness: sedated  Airway & Oxygen Therapy: Patient Spontanous Breathing and Patient placed on Ventilator (see vital sign flow sheet for setting)  Post-op Assessment: Report given to PACU RN and Post -op Vital signs reviewed and stable  Post vital signs: Reviewed and stable  Complications: No apparent anesthesia complications

## 2012-10-20 NOTE — Progress Notes (Signed)
UR COMPLETED  

## 2012-10-20 NOTE — Progress Notes (Signed)
Down to OR via bed after confirmation with Mellody Dance per Dr. Carola Frost.  Permit signed by spouse since pt had just received pain medication. Lopressor given as ordered with sip of H@) after confirmation with Ward Givens NP cardiology regarding pt's BP to give prior to OR. Pamelia Hoit

## 2012-10-20 NOTE — Consult Note (Signed)
Orthopaedic Trauma Service Consult  Reason: Complex Left tibial plateau fracture Requesting: Christain Sacramento, MD (Ortho)  Full consult dictated   Brief HPI     57 y/o female s/p fall while hiking yesterday. Brought to [facility] by EMS. Complex L tibial plateau fx. Transferred to [facility] hospital and admitted to Dr. Jerl Santos.  OTS consult requested due to complexity of the fracture    Pt seen in consult today by OTS.  C/o L knee pain.  No numbness or tingling     Denies current CP or SOB.  No numbness or tingling in Jaw or arms    No current N/V     H/o dyspnea 2 months ago while hiking, went to PCP for eval. Pt states that EKG was normal.  Pt also states that she is waiting on cards referral as well     Pt is active, walks and hikes regularly      She has a sedentary job- works for [employer] department     Family hx of MI - father, DM- brother   Review of Systems  Constitutional: Negative for fever and chills.  Eyes: Negative for blurred vision.  Respiratory:       No current SOB, episode of dyspnea approximately 2 months ago   Cardiovascular: Negative for chest pain and palpitations.  Gastrointestinal: Negative for nausea, vomiting and abdominal pain.  Genitourinary: Negative for dysuria.  Musculoskeletal:       L knee pain   Neurological: Negative for tingling, sensory change, focal weakness and headaches.   Exam  BP 112/46  Pulse 67  Temp(Src) 99.2 F (37.3 C) (Oral)  Resp 18  Ht 5\' 3"  (1.6 m)  Wt 99.791 kg (220 lb)  BMI 38.98 kg/m2  SpO2 93% Estimated body mass index is 38.98 kg/(m^2) as calculated from the following:   Height as of this encounter: 5\' 3"  (1.6 m).   Weight as of this encounter: 99.791 kg (220 lb).   Physical Exam  Nursing note and vitals reviewed. Constitutional: She is oriented to person, place, and time.  Obese white female. Very pleasant and appropriate.  Appears appropriate for stated age Cooperative  NAD   HENT:  Atraumatic ENT  without acute findings MMM   Eyes:  EOMI   Neck:  Neck supple No spinous process tenderness No changes in chronic pain character   Cardiovascular:  S1 and S2 Regularly irregular rhythm  L sided chest wall pain  Pulmonary/Chest:  CTA B   Abdominal:  Obese, soft, NTND, + BS  Musculoskeletal:  Left Lower Extremity  Inspection:    + valgus deformity of L knee    + swelling and effusion       Bony eval:     TTP L proximal tibia     nontender L distal femur, L patella, distal tibia, ankle or foot  Soft tissue:      Moderate swelling to Left lower leg      Minimal wrinkling of the skin is noted laterally       Compartments are soft, NOT tense       No pain out of proportion to passive stretch       Superficial abrasions to lateral aspect of L knee noted as well       Hip, ankle and foot unremarkable   ROM:     ROM of knee and hip not assessed due to fx at tibial plateau       ROM of ankle limited  by pain in L leg   Sensation:     DPN, SPN, TN sensation intact  Motor:     EHL, FHL, lesser toe motor function intact     AT, PT, peroneals, gastroc motor grossly intact   Vascular:     + DP pulse       Ext warm      Compartment exam as per above    Neurological: She is alert and oriented to person, place, and time.  Psychiatric: She has a normal mood and affect. Her speech is normal and behavior is normal. Judgment and thought content normal. Cognition and memory are normal.    Imaging  CT L knee    Comminuted L lateral tibial plateau with fx extension to medial plateau  CXR    No acute pulmonary processes noted.  No rib fxs noted  EKG    prolonged QTc    Sinus rhythm with PVC's   (no old ekg available for comparison)  Labs   Results for JAQULYN, CHANCELLOR (MRN [id number]) as of 10/20/2012 09:18  Ref. Range 10/19/2012 19:10  Chloride Latest Range: 96-112 mEq/L 103  CO2 Latest Range: 19-32 mEq/L 29  BUN Latest Range: 6-23 mg/dL 13  Creatinine Latest  Range: 0.50-1.10 mg/dL 6.21  Calcium Latest Range: 8.4-10.5 mg/dL 9.8  GFR calc non Af Amer Latest Range: >90 mL/min 66 (L)  GFR calc Af Amer Latest Range: >90 mL/min 76 (L)  Glucose Latest Range: 70-99 mg/dL 308 (H)  WBC Latest Range: 4.0-10.5 K/uL 9.7  RBC Latest Range: 3.87-5.11 MIL/uL 4.07  Hemoglobin Latest Range: 12.0-15.0 g/dL 65.7  HCT Latest Range: 36.0-46.0 % 37.9  MCV Latest Range: 78.0-100.0 fL 93.1  MCH Latest Range: 26.0-34.0 pg 31.9  MCHC Latest Range: 30.0-36.0 g/dL 84.6  RDW Latest Range: 11.5-15.5 % 13.0  Platelets Latest Range: 150-400 K/uL 212  Neutrophils Relative % Latest Range: 43-77 % 73  Lymphocytes Relative Latest Range: 12-46 % 21  Monocytes Relative Latest Range: 3-12 % 5  Eosinophils Relative Latest Range: 0-5 % 1  Basophils Relative Latest Range: 0-1 % 0  NEUT# Latest Range: 1.7-7.7 K/uL 7.1  Lymphocytes Absolute Latest Range: 0.7-4.0 K/uL 2.0  Monocytes Absolute Latest Range: 0.1-1.0 K/uL 0.5  Eosinophils Absolute Latest Range: 0.0-0.7 K/uL 0.1  Basophils Absolute Latest Range: 0.0-0.1 K/uL 0.0    Assessment and Plan  57 y/o female s/p fall with complex L tibial plateau fracture  1. Fall 2. L bicondylar tibial plateau fracture, shatzker V, OTA 41-C3  Pt will ultimately need to undergo plate osteosynthesis to address her injury and to reduce the joint surface that is punched down into the metaphysis  However, pts current degree of swelling will not allow this   Given the deformity present and the amount of swelling pt will need to be placed into a spanning external fixator for temporary stabilization  Would anticipate return to OR in 10-14 days for definitive fixation  Pt will be NWB on L Leg for now  Aggressive ice, elevation and compression to help with swelling control  3. Cardiac issues  Cardiac exam, ekg and hx from pt are somewhat concerning for some cardiac pathology  Have contacted cardiology for consult  Will have pt seen and evaluated  prior to going to OR  Will delay trip to OR for ex fix until Thursday if needed to complete work-up  Defer additional labs to cards   4. GERD  Protonix daily   5. DVT/PE prophylaxis  Will place  on lovenox  Pt has SCD's on   6. FEN  Keep NPO   Continue with NS infusion   7. Pain   Will titrate meds accordingly   Will stop mobic as it is an NSAID and will impair bone healing  8. Metabolic Bone disease, obesity   Will check some basic labs to see if there is anything that can be corrected   9. Dispo  Cards consult  Possible surgery later today for Ex fix of L leg o/w delay until Thursday   Mearl Latin, PA-C [facility] [phone] (P) 10/20/2012 9:27 AM

## 2012-10-20 NOTE — Brief Op Note (Signed)
10/19/2012 - 10/20/2012  4:09 PM  PATIENT:  Ivette Loyal Rovito  57 y.o. female  PRE-OPERATIVE DIAGNOSIS:  Left Tibial Plateau Fx  POST-OPERATIVE DIAGNOSIS:  Left Tibial Plateau Fx  PROCEDURE:  Procedure(s): EXTERNAL FIXATION Left Tibial Plateau Fx (Left) Closed reduction left bicondylar tibial plateau  SURGEON:  Surgeon(s) and Role:    * Budd Palmer, MD - Primary  PHYSICIAN ASSISTANT: Montez Morita, [facility]  ANESTHESIA:   general  EBL:  Total I/O In: 1000 [I.V.:1000] Out: 300 [Urine:300]  BLOOD ADMINISTERED:none  DRAINS: none   LOCAL MEDICATIONS USED:  NONE  SPECIMEN:  No Specimen  DISPOSITION OF SPECIMEN:  N/A  COUNTS:  YES  TOURNIQUET:  * No tourniquets in log *  DICTATION: .Other Dictation: Dictation Number [id number]  PLAN OF CARE: Admit to inpatient   PATIENT DISPOSITION:  PACU - hemodynamically stable.   Delay start of Pharmacological VTE agent (>24hrs) due to surgical blood loss or risk of bleeding: no

## 2012-10-20 NOTE — Progress Notes (Signed)
Patient has had sedation turned off. CXR show no issues. Vent settings dropped to PEEP 5, PS of 5, FiO2 28%. Patient resting comfortably

## 2012-10-20 NOTE — Consult Note (Signed)
CARDIOLOGY CONSULT NOTE  Patient ID: [name] MRN: [id number], DOB/AGE: [birth year]   Admit date: 10/19/2012 Date of Consult: 10/20/2012  Primary Physician: Harlow Asa, MD Primary Cardiologist: new - seen by P. Eden Emms, MD (would f/u in [facility]).  Pt. Profile Patient is a 57 year old female that [facility] to the [facility] in [place] 9/15 due to a fall while hiking. She landed on her left side and left knee which caused a tibial fracture.  We have been asked to provide preop cardiac risk eval.  Problem List  Past Medical History  Diagnosis Date  . Allergic rhinitis   . Arthritis   . Glaucoma   . GERD (gastroesophageal reflux disease)   . Venous stasis     Past Surgical History  Procedure Laterality Date  . Tubal ligation    . Cholecystectomy    . Joint replacement Bilateral     Hips   . Back surgery      cervical fusion   . Shoulder surgery    . Vocal cord surgery      Allergies  Allergies  Allergen Reactions  . Prednisone   . Amoxicillin Rash   HPI   58 y/o female w/o prior cardiac hx.  She is an active hiker and was in her [employer] until about 1 month ago when she began to note DOE, which was out of the ordinary for her.  She saw her PCP and an ECG was performed and was reportedly normal.  She was initially referred to cardiology but she deferred b/c she had a vacation pending.  Since then, she has not noted as much DOE and has continued to hike.  Yesterday, while hiking, with family and tripped and fell on her left ribs and left knee; she felt a crunch in her leg. [facility] went to [facility] in [place] where imaging revealed a comminuted tibial plateau fracture and no fracture or bone lesions involving the ribs.  She was transferred to [facility] for further eval and is pending surgery this afternoon.  We have been asked to provide preop cardiac risk eval.  She denies chest pain now or in the past.  ECG shows lateral twi and pvc's.  Inpatient  Medications  . chlorhexidine  60 mL Topical Once  . pantoprazole (PROTONIX) IV  40 mg Intravenous Q24H   Family History Family History  Problem Relation Age of Onset  . Heart attack Father   . Diabetes Brother     Social History History   Social History  . Marital Status: Married    Spouse Name: N/A    Number of Children: N/A  . Years of Education: N/A   Occupational History  . Not on file.   Social History Main Topics  . Smoking status: Never Smoker   . Smokeless tobacco: Not on file  . Alcohol Use: No  . Drug Use: No  . Sexual Activity: Not on file   Other Topics Concern  . Not on file   Social History Narrative  . No narrative on file    Review of Systems  General:  No chills, fever, night sweats or weight changes.  Cardiovascular:  No Hx of CAD, or HTN. No chest pain, dyspnea on exertion, orthopnea, palpitations, paroxysmal nocturnal dyspnea. Dermatological: No rash, lesions/masses Respiratory: no dyspnea or cough. Urologic: No dysuria Abdominal:   No nausea, vomiting, diarrhea, bright red blood per rectum, or hematemesis Neurologic:  No visual changes, wkns, changes in mental status. MSK:  She  c/o left lower leg pain and swelling/inflammation. All other systems reviewed and are otherwise negative except as noted above.  Physical Exam  Blood pressure 112/46, pulse 67, temperature 99.2 F (37.3 C), temperature source Oral, resp. rate 18, height 5\' 3"  (1.6 m), weight 220 lb (99.791 kg), SpO2 93.00%.  General: Pleasant, NAD Psych: Normal affect. Neuro: Alert and oriented X 3. LLE in brace, Moves all other extremities spontaneously. HEENT: Normal  Neck: Supple without bruits or JVD. Lungs:  Resp regular and unlabored, CTA. Heart: RRR no s3, s4, or murmurs. Abdomen: Soft, non-tender, non-distended, BS + x 4.  Extremities: No clubbing, cyanosis or edema all other extremities. DP/PT/Radials 2+ and equal bilaterally.  Labs  Lab Results  Component Value  Date   WBC 9.7 10/19/2012   HGB 13.0 10/19/2012   HCT 37.9 10/19/2012   MCV 93.1 10/19/2012   PLT 212 10/19/2012    Recent Labs Lab 10/19/12 1910  NA 140  K 4.3  CL 103  CO2 29  BUN 13  CREATININE 0.94  CALCIUM 9.8  GLUCOSE 124*   Radiology/Studies  Dg Ribs Unilateral W/chest Left  10/19/2012   CLINICAL DATA:  Fall with left chest pain.  EXAM: LEFT RIBS AND CHEST - 3+ VIEW   IMPRESSION: No acute left rib fracture identified.   Electronically Signed   By: Irish Lack   On: 10/19/2012 17:44   Ct Knee Left Wo Contrast  10/19/2012   CLINICAL DATA:  Patient with a tibial plateau fracture. This is for further delineation.  EXAM: CT OF THE LEFT KNEE WITHOUT CONTRAST  TECHNIQUE: IMPRESSION: Extensively comminuted, mildly displaced fracture of the proximal left tibia. The fracture primarily involves the lateral tibial plateau with depression of the central articular service by in maximal 1 cm. Fracture extends inferiorly to the mid-diaphysis and there is a more simple appearing nondisplaced fracture of the medial tibial metaphysis.   Electronically Signed   By: Amie Portland   On: 10/19/2012 20:13   Dg Knee Complete 4 Views Left  10/19/2012   CLINICAL DATA:  Fall with left knee pain.  EXAM: LEFT KNEE - COMPLETE 4+ VIEW   IMPRESSION: Comminuted proximal tibial fracture involving the central joint space and extending into the lateral tibial plateau and lateral metaphysis.   Electronically Signed   By: Irish Lack   On: 10/19/2012 17:48   ECG Sinus rhythm with PVC 88 bpm; Inferolateral ST depression.  ASSESSMENT AND PLAN  1.  Comminuted L tibial fracture:  Pending surgical correction.  We've been asked to eval for preop cardiac risk assessment.  She has no prior cardiac hx and is fairly active @ home, hiking often.  She does report some DOE but she has never had chest pain.  ECG is abnl with lateral twi.  We will obtain 2D echo now and if this shows normal LV function w/o regional wall  motion abnormalities, or severe valvular abnormalities, she will be ok to proceed to OR.  We will add low-dose bb 2/2 frequent PVC's.  If however echo is abnormal, she will require additional ischemic eval (cath vs stress testing).  2.  Dyspnea on exertion:  Echo pending.  We will read as soon as it is available (ordered stat).  3.  PVC's:  Adding low dose bb.  Will check TSH and Mg.  Signed, Nicolasa Ducking, NP 10/20/2012, 9:41 AM   Patient examined chart reviewed. Dyspne appears functional primarily when going up hill on difficult hikes.  Doubt this  is an anginal equivalent in non diabetic non smoker.  ECG with PVC and nonspecific ST/T wave changes Start low dose beta blocker.  Will clear for surgery if echo shows normal LV function.  Have called lab to do ASAP so surgery can be done today.  No murmur on exam.    Charlton Haws

## 2012-10-20 NOTE — Progress Notes (Signed)
Pre-op  I discussed with the patient and her husband the risks and benefits of surgery for her left tibial plateau fracture, including the possibility of infection, nerve injury, vessel injury, wound breakdown, arthritis, symptomatic hardware, DVT/ PE, loss of motion, and need for further surgery among others.  We also specifically discussed the need to stage surgery because of the elevated risk of soft tissue breakdown that could lead to infection and amputation.  They understood these risks and wished to proceed with provisional external fixation of the left knee today.  Myrene Galas, MD [facility] [phone] (p)

## 2012-10-20 NOTE — Progress Notes (Deleted)
Patient came to PACU still intubated. Settings of the vent set by Respiratory Therapist. PEEP 5, FiO2 50, PS above PEEP of 10. Propofol drip started per Everlene Balls CRNA. Montez Morita PA notified of patients condition and new room orders received. Patient resting comfortably.

## 2012-10-20 NOTE — Progress Notes (Signed)
Patient ID: [name], female   DOB: 1956/01/22, 57 y.o.   MRN: [id number]  Reviewed echo EF 55-60% no significant valve disease Clear to have surgery latter today Tried to call Dr Candace Gallus office but no answer other than machine ( [phone])  [name]

## 2012-10-20 NOTE — Progress Notes (Signed)
Patient increased on her Propofol drip to 54mcg/kg/min per Everlene Balls CRNA

## 2012-10-20 NOTE — Anesthesia Preprocedure Evaluation (Addendum)
Anesthesia Evaluation  Patient identified by MRN, date of birth, ID band Patient awake    Reviewed: Allergy & Precautions, H&P , NPO status , Patient's Chart, lab work & pertinent test results, reviewed documented beta blocker date and time   Airway Mallampati: II TM Distance: >3 FB Neck ROM: full    Dental no notable dental hx.    Pulmonary neg pulmonary ROS, shortness of breath and with exertion,  breath sounds clear to auscultation  Pulmonary exam normal       Cardiovascular Exercise Tolerance: Good negative cardio ROS  Rhythm:regular Rate:Normal  Prolonged QT on ECG   Neuro/Psych negative neurological ROS  negative psych ROS   GI/Hepatic negative GI ROS, Neg liver ROS, GERD-  Medicated and Controlled,  Endo/Other  negative endocrine ROS  Renal/GU negative Renal ROS  negative genitourinary   Musculoskeletal   Abdominal   Peds  Hematology negative hematology ROS (+)   Anesthesia Other Findings   Reproductive/Obstetrics negative OB ROS                           Anesthesia Physical Anesthesia Plan  ASA: II  Anesthesia Plan: General   Post-op Pain Management:    Induction: Intravenous  Airway Management Planned: Oral ETT  Additional Equipment:   Intra-op Plan:   Post-operative Plan: Extubation in OR  Informed Consent: I have reviewed the patients History and Physical, chart, labs and discussed the procedure including the risks, benefits and alternatives for the proposed anesthesia with the patient or authorized representative who has indicated his/her understanding and acceptance.   Dental Advisory Given  Plan Discussed with: CRNA, Anesthesiologist and Surgeon  Anesthesia Plan Comments:        Anesthesia Quick Evaluation

## 2012-10-21 ENCOUNTER — Inpatient Hospital Stay (HOSPITAL_COMMUNITY): Payer: 59

## 2012-10-21 DIAGNOSIS — I493 Ventricular premature depolarization: Secondary | ICD-10-CM

## 2012-10-21 DIAGNOSIS — I4949 Other premature depolarization: Secondary | ICD-10-CM

## 2012-10-21 LAB — PREALBUMIN: Prealbumin: 12.4 mg/dL — ABNORMAL LOW (ref 17.0–34.0)

## 2012-10-21 LAB — COMPREHENSIVE METABOLIC PANEL
ALT: 15 U/L (ref 0–35)
AST: 20 U/L (ref 0–37)
Alkaline Phosphatase: 43 U/L (ref 39–117)
CO2: 28 mEq/L (ref 19–32)
GFR calc Af Amer: 85 mL/min — ABNORMAL LOW (ref >90)
GFR calc non Af Amer: 73 mL/min — ABNORMAL LOW (ref >90)
Glucose, Bld: 127 mg/dL — ABNORMAL HIGH (ref 70–99)
Potassium: 3.9 mEq/L (ref 3.5–5.1)
Sodium: 139 mEq/L (ref 135–145)

## 2012-10-21 LAB — TSH: TSH: 0.954 u[IU]/mL (ref 0.350–4.500)

## 2012-10-21 LAB — CBC
HCT: 35.3 % — ABNORMAL LOW (ref 36.0–46.0)
Hemoglobin: 12.1 g/dL (ref 12.0–15.0)
WBC: 10 10*3/uL (ref 4.0–10.5)

## 2012-10-21 MED ORDER — ENOXAPARIN SODIUM 40 MG/0.4ML ~~LOC~~ SOLN
40.0000 mg | SUBCUTANEOUS | Status: DC
  Administered 2012-10-21 – 2012-10-23 (×3): 40 mg via SUBCUTANEOUS
  Filled 2012-10-21 (×3): qty 0.4

## 2012-10-21 NOTE — Op Note (Signed)
NAMESEDONA, WENK NO.:  [id number]  MEDICAL RECORD NO.:  [id number]  LOCATION:  2S04C                        FACILITY:  [facility]  PHYSICIAN:  Doralee Albino. Carola Frost, M.D. DATE OF BIRTH:  [birth year]  DATE OF PROCEDURE:  10/20/2012 DATE OF DISCHARGE:                              OPERATIVE REPORT   PREOPERATIVE DIAGNOSIS:  Left bicondylar tibial plateau fracture.  POSTOPERATIVE DIAGNOSIS:  Left bicondylar tibial plateau fracture.  PROCEDURE: 1. Closed reduction of left bicondylar tibial plateau. 2. Application of spanning external fixator.  SURGEON:  Doralee Albino. Carola Frost, M.D.  ASSISTANT:  Mearl Latin, PA-C  ANESTHESIA:  General.  COMPLICATIONS:  None.  TOURNIQUET:  None.  ESTIMATED BLOOD LOSS:  Minimal.  DISPOSITION:  To PACU.  CONDITION:  Stable.  BRIEF SUMMARY AND INDICATION FOR PROCEDURE:  Kriti Katayama is a 57- year-old female who sustained a fall on a nature walk resulting in severe pain and valgus deformity at the knee.  She was transferred to [facility] for further evaluation and management where she was admitted by Dr. Marcene Corning.  At that time, her compartments were found to be soft despite the swelling.  She did not have any pain with passive stretch.  She was evaluated again this morning.  CT scan demonstrated significant comminution as well as dislocation or displacement of the lateral tibial plateau from underneath the femoral condyle.  I discussed with her and her husband the risks and benefits of spanning external fixation given that her soft tissue swelling was sufficient to preclude early definitive ORIF.  They understood these included pin tract infection, nerve injury, vessel injury, DVT, PE, loss of motion of the knee, failure to prevent arthritis, need for further surgery, and many others, and they did wish to proceed.  BRIEF SUMMARY OF PROCEDURE:  After Ms. Pandolfi was seen and evaluated by the cardiac service because of  previous history of dyspnea on exertion, she was taken to the OR where general anesthesia was induced. Did receive preoperative Ancef and tolerated this well even though she has an allergy to amoxicillin.  Test dose was administered first.  The left lower extremity was prepped and draped in usual sterile fashion. Femoral pins were placed into the left femur and then checked under C- arm.  C-arm check did demonstrate very close proximity of the proximal pin to the femoral stem from her hip replacement and I did not wish to contaminate this area or prevent a stress riser by prolonged use of a pin there and consequently chose to move this pin in favor of 1 more distal.  The tibial pins were placed near the more proximal pin, began in a unicortical fashion and consequently was not advanced and instead a new hole placed slightly proximal to it in the center of the bone. Images showed appropriate location, trajectory, and length of these pins time without any violation of the far cortex, but penetration into it. A closed reduction maneuver was performed as I pulled maximal traction with the help of an additional assistant placed a slight varus stress to reduce these fragments.  My PA Montez Morita then secured all the clamps to maintain the tibia in a reduced position.  Final AP and lateral images showed appropriate reduction, restoration of articular congruity for the most part, and restoration of length and alignment.  The lateral plateau did reduce underneath the lateral femoral condyle.  Sterile gently compressive dressings were applied using Kerlix around the pin, Ace wrap from foot to thigh.  The patient was awakened from anesthesia and transferred to PACU in stable condition.  Montez Morita, PA-C again did assist me throughout the procedure.  PROGNOSIS:  Ms. Nodal will be nonweightbearing with elevation and ice of that extremity.  We will plan to see her back in the office in 1 week for  re-evaluation of her soft tissues and we will proceed with definitive ORIF as soon as these tissues do allow for safe exposure. She was at increased risk for thromboembolic disease and will be on Lovenox for DVT prophylaxis.     Doralee Albino. Carola Frost, M.D.     [facility]MED[facility]  D:  10/20/2012  T:  10/21/2012  Job:  [id number]

## 2012-10-21 NOTE — Progress Notes (Signed)
Orthopaedic Trauma [facility]  Subjective  Doing much better Pain tolerable No new respiratory issues  No CP, no SOB  Review of Systems  Constitutional: Negative for fever and chills.  Respiratory: Negative for shortness of breath.   Cardiovascular: Negative for chest pain and palpitations.  Gastrointestinal: Negative for nausea, vomiting and abdominal pain.  Neurological: Negative for tingling, sensory change and headaches.    Exam  BP 103/62  Pulse 83  Temp(Src) 97.8 F (36.6 C) (Oral)  Resp 16  Ht 5\' 3"  (1.6 m)  Wt 107.2 kg (236 lb 5.3 oz)  BMI 41.88 kg/m2  SpO2 90%  Intake/Output     09/16 0701 - 09/17 0700 09/17 0701 - 09/18 0700   P.O. 350    I.V. (mL/kg) 1750 (16.3)    IV Piggyback 255    Total Intake(mL/kg) 2355 (22)    Urine (mL/kg/hr) 650 (0.3)    Total Output 650     Net +1705           Labs  Results for CALLY, NYGARD (MRN [id number]) as of 10/21/2012 08:09  Ref. Range 10/21/2012 02:45  Sodium Latest Range: 135-145 mEq/L 139  Potassium Latest Range: 3.5-5.1 mEq/L 3.9  Chloride Latest Range: 96-112 mEq/L 104  CO2 Latest Range: 19-32 mEq/L 28  BUN Latest Range: 6-23 mg/dL 11  Creatinine Latest Range: 0.50-1.10 mg/dL 5.18  Calcium Latest Range: 8.4-10.5 mg/dL 8.6  GFR calc non Af Amer Latest Range: >90 mL/min 73 (L)  GFR calc Af Amer Latest Range: >90 mL/min 85 (L)  Glucose Latest Range: 70-99 mg/dL 841 (H)  Alkaline Phosphatase Latest Range: 39-117 U/L 43  Albumin Latest Range: 3.5-5.2 g/dL 3.1 (L)  AST Latest Range: 0-37 U/L 20  ALT Latest Range: 0-35 U/L 15  Total Protein Latest Range: 6.0-8.3 g/dL 6.0  Total Bilirubin Latest Range: 0.3-1.2 mg/dL 0.7  WBC Latest Range: 4.0-10.5 K/uL 10.0  RBC Latest Range: 3.87-5.11 MIL/uL 3.80 (L)  Hemoglobin Latest Range: 12.0-15.0 g/dL 66.0  HCT Latest Range: 36.0-46.0 % 35.3 (L)  MCV Latest Range: 78.0-100.0 fL 92.9  MCH Latest Range: 26.0-34.0 pg 31.8  MCHC Latest Range: 30.0-36.0 g/dL 63.0  RDW  Latest Range: 11.5-15.5 % 13.1  Platelets Latest Range: 150-400 K/uL 160    Exam  Gen: awake and alert, appears comfortable Lungs: clear anterior fields Cardiac: reg irreg, no change, s1 and s2 Abd: soft, + BS, NT Ext:    Left Lower Extremity  Ex fix stable  pinsites look good  ACE wrap fitting well  EHL, FHL, AT, PT, peroneals, gastrocsoleus motor intact  Ext warm  + DP pulse   No pain with passive stretch   Assessment and Plan  57 y/o female s/p fall with complex L tibial plateau fracture  1. Fall 2. L bicondylar tibial plateau fracture, shatzker V, OTA 41-C3, POD 1 ex fix Left leg  NWB   Ice and elevate  Ace wrap  ROM L toes and ankles as tolerated  PT/OT consults  Dressing change tomorrow  Foot pump for DVT prophylaxis              3. Cardiac issues            appreciate cards assistance   4. GERD             Protonix daily   5. DVT/PE prophylaxis            start lovenox today  Foot pumps/scd's    6. FEN  advance as tolerated  Continue with IVF for today   7. Pain    Minimize narcotics  Continue with tylenol and robaxin                8. Metabolic Bone disease, obesity            labs pending  9. Dispo             transfer to ortho floor  PT/OT consults  Possible d/c home tomorrow if does well and pain controlled   Mearl Latin, PA-C [facility] [phone] (P) 10/21/2012 8:31 AM

## 2012-10-21 NOTE — Progress Notes (Signed)
Orthopedic Tech Progress Note Patient Details:  [name] [birth year] [id number]  Patient ID: [name], female   DOB: [birth year], 57 y.o.   MRN: [id number]   [name] 10/21/2012, 11:29 AMTrapeze bar

## 2012-10-21 NOTE — Progress Notes (Signed)
Patient transferred up from 2S.  Spoke with Montez Morita, PA, new order to place patient on telemetry monitoring and to resume pulse ox monitoring.  Dressing change will be done in am, no new orders regarding external fixator pin site care at this time.

## 2012-10-21 NOTE — Evaluation (Signed)
Occupational Therapy Evaluation Patient Details Name: [name] MRN: [id number] DOB: 09/25/1955 Today's Date: 10/21/2012 Time: 4098-1191 OT Time Calculation (min): 20 min  OT Assessment / Plan / Recommendation History of present illness Pt tripped and fell after hiking, sustaining left tibial plateau fx and underwent external fixation. SHe has a history of bilateral posterior approach hip replacements (both more than a yr ago) as well as right shoulder surgery for OA.    Clinical Impression   Pt presents to OT with the above diagnosis.  She will benefit from acute OT to address the below listed deficits to increase her independence with BADLs and to facilitate discharge to next venue of care.  Husband unable to provide 24 hour assistance, and is unable to lift due to recent back surgery.  Recommend SNF.     OT Assessment  Patient needs continued OT Services    Follow Up Recommendations  SNF    Barriers to Discharge Decreased caregiver support    Equipment Recommendations  None recommended by OT    Recommendations for Other Services    Frequency  Min 2X/week    Precautions / Restrictions Precautions Precautions: Fall Required Braces or Orthoses: Other Brace/Splint Other Brace/Splint: external fixation Restrictions Weight Bearing Restrictions: Yes LLE Weight Bearing: Non weight bearing   Pertinent Vitals/Pain     ADL  Eating/Feeding: Independent Where Assessed - Eating/Feeding: Chair Grooming: Wash/dry hands;Wash/dry face;Teeth care;Brushing hair;Set up Where Assessed - Grooming: Supported sitting Upper Body Bathing: Set up Where Assessed - Upper Body Bathing: Supine, head of bed up;Supported sitting Lower Body Bathing: Maximal assistance Where Assessed - Lower Body Bathing: Supine, head of bed up;Rolling right and/or left Upper Body Dressing: Minimal assistance Where Assessed - Upper Body Dressing: Supported sitting;Unsupported sitting Lower Body Dressing: +1  Total assistance Where Assessed - Lower Body Dressing: Supine, head of bed up;Rolling right and/or left Toilet Transfer: +2 Total assistance Toilet Transfer: Patient Percentage: 60% Toilet Transfer Method: Stand pivot Toilet Transfer Equipment: Bedside commode Toileting - Clothing Manipulation and Hygiene: +1 Total assistance Where Assessed - Toileting Clothing Manipulation and Hygiene: Standing Transfers/Ambulation Related to ADLs: stand pivot transfer with total A +2 (pt ~60%)    OT Diagnosis: Generalized weakness;Acute pain  OT Problem List: Decreased strength;Decreased activity tolerance;Decreased safety awareness;Decreased knowledge of use of DME or AE;Decreased knowledge of precautions;Pain OT Treatment Interventions: Self-care/ADL training;DME and/or AE instruction;Therapeutic activities;Patient/family education   OT Goals(Current goals can be found in the care plan section) Acute Rehab OT Goals Patient Stated Goal: To get better OT Goal Formulation: With patient/family Time For Goal Achievement: 10/28/12 Potential to Achieve Goals: Good  Visit Information  Last OT Received On: 10/21/12 Assistance Needed: +2 History of Present Illness: Pt tripped and fell after hiking, sustaining left tibial plateau fx and underwent external fixation. SHe has a history of bilateral posterior approach hip replacements (both more than a yr ago) as well as right shoulder surgery for OA.        Prior Functioning     Home Living Family/patient expects to be discharged to:: Skilled nursing facility Living Arrangements: Spouse/significant other Home Equipment: Walker - 2 wheels;Tub bench Additional Comments: pt's husband does shift work so she would be alone at times and he recently had back surgery. He reports that he could build a ramp to enter home. He is checking to see if they have a BSC but unsure. 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 Dominant Hand: Right  Vision/Perception     Cognition  Cognition Arousal/Alertness: Awake/alert Behavior During Therapy: WFL for tasks assessed/performed Overall Cognitive Status: Within Functional Limits for tasks assessed    Extremity/Trunk Assessment Upper Extremity Assessment Upper Extremity Assessment: RUE deficits/detail RUE Deficits / Details: h/o RTC tear Lower Extremity Assessment Lower Extremity Assessment: Defer to PT evaluation     Mobility Bed Mobility Bed Mobility: Sit to Supine;Scooting to HOB Sit to Supine: 2: Max assist;HOB flat Scooting to HOB: 3: Mod assist;With trapeze Details for Bed Mobility Assistance: Pt requires step by step instruction Transfers Transfers: Sit to Stand;Stand to Sit Sit to Stand: 1: +2 Total assist;With upper extremity assist;From chair/3-in-1 Sit to Stand: Patient Percentage: 60% Stand to Sit: 1: +2 Total assist;With upper extremity assist;To bed Stand to Sit: Patient Percentage: 60% Details for Transfer Assistance: Lt. LE supported throughout transfer.  Transferred to pt's Rt. side.  Assist to achieve standing     Exercise     Balance     End of Session OT - End of Session Activity Tolerance: Patient tolerated treatment well Patient left: in bed;with call bell/phone within reach;with nursing/sitter in room;with family/visitor present Nurse Communication: Mobility status  GO     Jeani Hawking M 10/21/2012, 4:02 PM

## 2012-10-21 NOTE — Anesthesia Postprocedure Evaluation (Signed)
  Anesthesia Post-op Note  Patient: [name]  Procedure(s) Performed: Procedure(s): EXTERNAL FIXATION Left Tibial Plateau Fx (Left)  Patient Location: PACU  Anesthesia Type:General  Level of Consciousness: awake and alert   Airway and Oxygen Therapy: Patient Spontanous Breathing  Post-op Pain: mild  Post-op Assessment: Post-op Vital signs reviewed, Patient's Cardiovascular Status Stable, Respiratory Function Stable, Patent Airway and No signs of Nausea or vomiting  Post-op Vital Signs: Reviewed and stable  Complications: No apparent anesthesia complications

## 2012-10-21 NOTE — Evaluation (Signed)
Physical Therapy Evaluation Patient Details Name: BLONDIE RIGGSBEE MRN: [id number] DOB: 1955-09-25 Today's Date: 10/21/2012 Time: 4132-4401 PT Time Calculation (min): 30 min  PT Assessment / Plan / Recommendation History of Present Illness  Pt tripped and fell after hiking, sustaining left tibial plateau fx and underwent external fixation. SHe has a history of bilateral posterior approach hip replacements (both more than a yr ago) as well as right shoulder surgery for OA.   Clinical Impression  Pt transferred bed to chair with +2 tot A, pt 40%. At this point, recommend SNF for time that she has ex fix. Pt would be by herself at home for prolonged periods of time and is not expected to be able to lift left leg with ex fix to perform transfers on her own and keep NWB. PT will follow acutely to help pt increase mobility and improve transfers. She will need a w/c with elevating left leg rest.     PT Assessment  Patient needs continued PT services    Follow Up Recommendations  SNF;Supervision/Assistance - 24 hour    Does the patient have the potential to tolerate intense rehabilitation      Barriers to Discharge Decreased caregiver support      Equipment Recommendations  3in1 (PT);Wheelchair (measurements PT)    Recommendations for Other Services OT consult   Frequency Min 4X/week    Precautions / Restrictions Precautions Precautions: Fall Required Braces or Orthoses: Other Brace/Splint Other Brace/Splint: external fixation Restrictions Weight Bearing Restrictions: Yes LLE Weight Bearing: Non weight bearing   Pertinent Vitals/Pain Left leg 4/10 after transfer, RN gave meds      Mobility  Bed Mobility Bed Mobility: Supine to Sit;Sitting - Scoot to Edge of Bed Supine to Sit: 1: +2 Total assist;With rails Supine to Sit: Patient Percentage: 20% Sitting - Scoot to Edge of Bed: 1: +2 Total assist Sitting - Scoot to Edge of Bed: Patient Percentage: 20% Details for Bed Mobility  Assistance: pt fearful of moving but once initiated, she was able to assist by pulling on rail to achieve sitting. Left leg supported throughout and kept in elevated position once on edge of bed. Pad used to slide hips to EOB as pt having difficulty with wt-shifting Transfers Transfers: Sit to Stand;Stand to Sit;Stand Pivot Transfers Sit to Stand: 1: +2 Total assist;From bed;With upper extremity assist Sit to Stand: Patient Percentage: 40% Stand to Sit: 1: +2 Total assist;To chair/3-in-1 Stand to Sit: Patient Percentage: 40% Stand Pivot Transfers: 1: +2 Total assist Stand Pivot Transfers: Patient Percentage: 40% Details for Transfer Assistance: kept left leg supported throughout transfer. Transferred to right side, pt unable to hop on rlight leg, pivoted to chair with lifting of right heel. Pt with sufficient RLE strength to use it for sit to stand. Ambulation/Gait Ambulation/Gait Assistance: Not tested (comment) (unable to hop) Stairs: No Wheelchair Mobility Wheelchair Mobility: No    Exercises General Exercises - Lower Extremity Ankle Circles/Pumps: AROM;Both;5 reps;Seated   PT Diagnosis: Difficulty walking;Acute pain  PT Problem List: Decreased strength;Decreased activity tolerance;Decreased range of motion;Decreased balance;Decreased mobility;Decreased knowledge of use of DME;Decreased knowledge of precautions;Pain PT Treatment Interventions: DME instruction;Functional mobility training;Therapeutic activities;Therapeutic exercise;Balance training;Patient/family education     PT Goals(Current goals can be found in the care plan section) Acute Rehab PT Goals Patient Stated Goal: return home when she is mobile PT Goal Formulation: With patient Time For Goal Achievement: 11/04/12 Potential to Achieve Goals: Good  Visit Information  Last PT Received On: 10/21/12 Assistance Needed: +2 History  of Present Illness: Pt tripped and fell after hiking, sustaining left tibial plateau fx and  underwent external fixation. SHe has a history of bilateral posterior approach hip replacements (both more than a yr ago) as well as right shoulder surgery for OA.        Prior Functioning  Home Living Family/patient expects to be discharged to:: Private residence Living Arrangements: Spouse/significant other Available Help at Discharge: Family;Available PRN/intermittently Type of Home: House Home Access: Stairs to enter Entrance Stairs-Number of Steps: 2 Home Layout: One level Home Equipment: Walker - 2 wheels;Tub bench Additional Comments: pt's husband does shift work so she would be alone at times and he recently had back surgery. He reports that he could build a ramp to enter home. He is checking to see if they have a BSC but unsure. Prior Function Level of Independence: Independent Communication Communication: No difficulties    Cognition  Cognition Arousal/Alertness: Awake/alert Behavior During Therapy: WFL for tasks assessed/performed Overall Cognitive Status: Within Functional Limits for tasks assessed    Extremity/Trunk Assessment Upper Extremity Assessment Upper Extremity Assessment: Defer to OT evaluation Lower Extremity Assessment Lower Extremity Assessment: RLE deficits/detail;LLE deficits/detail RLE Deficits / Details: WFL LLE Deficits / Details: can wiggle toes, has minimal ROM at ankle, unable to lift leg with ex fix Cervical / Trunk Assessment Cervical / Trunk Assessment: Normal   Balance Balance Balance Assessed: Yes Static Standing Balance Static Standing - Balance Support: Bilateral upper extremity supported;During functional activity Static Standing - Level of Assistance: 1: +2 Total assist;Patient percentage (comment) (pt 40%)  End of Session PT - End of Session Equipment Utilized During Treatment: Gait belt Activity Tolerance: Patient tolerated treatment well Patient left: in chair;with call bell/phone within reach;with family/visitor present Nurse  Communication: Mobility status  GP   Lyanne Co, PT  Acute Rehab Services  [phone]   Lyanne Co 10/21/2012, 11:12 AM

## 2012-10-21 NOTE — Consult Note (Signed)
[name], CODISPOTI NO.:  [id number]  MEDICAL RECORD NO.:  [id number]  LOCATION:  2S04C                        FACILITY:  [facility]  PHYSICIAN:  Mearl Latin, PA       DATE OF BIRTH:  02-Mar-1955  DATE OF CONSULTATION:  10/20/2012 DATE OF DISCHARGE:                                CONSULTATION   REASON FOR CONSULTATION:  Fall with comminuted left tibial plateau fracture.  BRIEF HISTORY OF THE PRESENT ILLNESS:  [name] is a very pleasant, 57 year old, white female, who was hiking yesterday afternoon with her family.  She sustained a ground-level fall.  She is unsure what she tripped over.  Denies any blacking out prior to or after the accident. Did not strike her head or lose consciousness.  She was unable to get up and had immediate onset of pain in her left leg.  She was brought by EMS to [facility] for evaluation.  She was found to have a complex left tibial plateau fracture.  Given the complexity of her injury, she was transferred to [facility] and admitted to Dr. Jerl Santos of Orthopedics.  Due to the severe complexity of her fracture pattern, the Orthopedic Trauma Service was consulted for definitive management. Again, the patient was admitted on Jun 18, 2012.   The Orthopedic Trauma service consulted on the patient on October 20, 2012.  The patient wasseen this morning in room 5 [place] of 20.  She complains primarily ofleft knee pain and little bit of some left-sided chest wall pain due to some rib contusions.  She denies any chest pain or shortness of breath at the current time.  No numbness or tingling in her jaw or arms.  No current nausea or vomiting.  No additional injuries are noted elsewhere.  Upon further questioning, the patient also has a history of dyspnea about 2 months ago when hiking.  She went to the PCP for this.  patient stated that her EKG was normal.  She has been waiting for a cardiology consult as well.  The patient is  active and walks regularly. She has a sedentary job.  She works at [employer] in the [employer]  REVIEW OF SYSTEMS:  CONSTITUTIONAL:  Negative for fevers and chills. HEENT:  No blurred vision.  RESPIRATORY:  No shortness of breath.  No current dyspnea.  CARDIOVASCULAR:  Negative for chest pain, no palpitations.  GI:  Negative for nausea, vomiting, and abdominal pain. GU:  No dysuria.  MUSCULOSKELETAL:  Notable for left knee pain. NEUROLOGIC:  Negative for tingling, sensory changes.  No focal weakness or headaches.  PAST MEDICAL HISTORY:  Notable for allergic rhinitis, arthritis, glaucoma, GERD, and venous stasis.  PAST SURGICAL HISTORY:  Tubal ligation, emergency cholecystectomy, bilateral hip replacements, cervical fusion, shoulder surgery, and vocal cord surgery.  FAMILY HISTORY:  Notable for heart attack in father and diabetes in her brother.  SOCIAL HISTORY:  The patient never smoker.  Does not drink on a regular basis.  She works for a [employer] in the [employer]  MEDICATIONS:  Prior to admission include hydrocodone, Mobic, Zantac, tramadol, and Travatan ophthalmic solution.  ALLERGIES:  Include prednisone.  The patient states  that she gets short of breath when she takes prednisone and amoxicillin, which she gets a rash.  PHYSICAL EXAMINATION:  VITAL SIGNS:  Temperature is 99.2, heart rate 67, respirations 18, 93% on room air, BP is 112/46.  The patient is 5 feet 3 inches tall, 220 pounds.  BMI 38.98 kg/m2. CONSTITUTIONAL:  The patient is oriented to person, place, and time. She is an obese white female, very pleasant and appropriate.  She is appropriate for stated age, cooperative in no acute distress. HEENT:  Head is atraumatic.  Extraocular muscles are intact.  Ears, nose and throat, without any acute findings.  Moist mucous membranes are noted.  NECK:  Supple.  No spinous process tenderness.  No changes in character and chronic pain.   CARDIOVASCULAR:  S1 and S2 are noted. There is some left-sided chest wall pain on palpation.  Rhythm is regularly irregular. LUNGS:  Clear to auscultation bilaterally. ABDOMEN:  Soft, nontender, nondistended.  Positive bowel sounds, obese. MUSCULOSKELETAL:  Bilateral upper extremities and right lower extremity without any acute findings.  Motor and sensory functions are grossly intact.  Extremities are warm with palpable peripheral pulses.  No open wounds or lesions are noted. LEFT LOWER EXTREMITY Inspection:  There is valgus deformity of the left knee.  There is swelling as well as an effusion. BONY EVALUATION:  Tender to palpation in the left proximal tibia. Nontender to left distal femur, left patella, distal tibia, ankle or foot. SOFT TISSUE:  Moderate swelling to left lower leg.  Minimal wrinkling of the skin is noted laterally.  Compartments are soft.  They are not tense.  No pain at upper portion to passive stretch.  Superficial abrasions are noted in the lateral aspect of the left knee.  Hip, ankle, and foot are unremarkable. RANGE OF MOTION:  Range of motion of knee and hip are not assessed due to fracture and tibial plateau.  Range of motion of the ankle limited by pain in the left leg. SENSATION:  Deep peroneal nerve, superficial peroneal nerve, and tibial nerve sensory functions are grossly intact. MOTOR:  EHL, FHL, lesser toe motor function are intact.  Anterior tibialis, posterior tibialis, peroneals, and gastrocsoleus complex motor function grossly intact. VASCULAR:  There is a palpable dorsalis pedis pulse.  Extremities warm. Compartment exam as per above. NEUROLOGIC:  She is alert and oriented to person, place, and time. PSYCH:  She has normal mood and affect.  Her speech is normal as is her behavior.  Her judgment and thought content are normal.  Memory is normal as well.  IMAGING:  CT of the left knee demonstrates comminuted left tibial plateau fracture with  extension to the medial plateau.  Chest x-ray no acute cardiopulmonary processes are noted on the x-ray.  No rib fracture is identified.  EKG appeared to be a prolonged QTc interval as well as sinus rhythm with PVCs.  There is no old EKG available for comparison.  PERTINENT LABORATORY:  Hemoglobin 13.0, hematocrit 37.9, white blood cells 9.7, platelets 212.  Sodium 140, potassium 4.3, chloride 103, bicarb 29, BUN 13, creatinine 0.94.  ASSESSMENT AND PLAN:  A 57 year old female status post fall with complex left tibial plateau fracture, and atypical cardiac symptoms. 1. Fall. 2. Left bicondylar tibial plateau fracture, Schatzker 5, OTA 41-C3. 3. The patient will ultimately need to undergo plate osseous symphysis     to address her injury.  Definitively, this will reduce her joint     surface as it hunched down into the  metaphysis.  However the     patient's current degree of swelling will not allow this.  Given     the valgus deformity present and the amount of swelling, the     patient will need to be placed in a temporary spanning external     fixator.  I would then anticipate return to the OR in 10-14 days     for definitive fixation.  The patient will be nonweightbearing on     the left leg for now. will continue with aggressive ice,     elevation, and compressive wraps to help with swelling. 4. Cardiac issues, cardiac exam, EKG and history from the patient     somewhat concerning for some cardiac pathology, contact cardiology     for consult.  We will have the patient seen and evaluated prior to     going to the OR.  We can delay the trip to the OR for ex fix until     Thursday if needed to complete workup, defer any additional labs to     Cardiology. 5. Gastroesophageal reflux disease, Protonix daily. 6. Deep vein thrombosis, pulmonary embolism prophylaxis.  The patient     had elevated risk for development of DVT and PE given her obesity,     orthopedic injuries and immobility.   We will place her on Lovenox     postoperatively and continue on until after definitive fixation.     The patient will continue with SCDs. 7. FEN.  We will keep n.p.o.  Continue with normal saline infusion. 8. Pain.  We will titrate meds accordingly.  We will stop her Mobic as     it is an NSAID and will impair bone healing. 9. Metabolic bone disease/obesity.  We will check some basic labs, see     if there is anything that we can correct from a laboratory     standpoint, optimize for bone healing.  DISPOSITION:  Cardiology consult, possibly surgery earlier today for ex fix of her left leg.  Otherwise, we will delay until Thursday.     Mearl Latin, PA     KWP/MEDQ  D:  10/20/2012  T:  10/21/2012  Job:  [id number]

## 2012-10-21 NOTE — Progress Notes (Signed)
Patient ID: [name], female   DOB: 1955-02-23, 57 y.o.   MRN: [id number]    Subjective:  Denies SSCP, palpitations or Dyspnea Significant knee pain  Objective:  Filed Vitals:   10/21/12 0645 10/21/12 0800 10/21/12 0802 10/21/12 0900  BP:  99/53  114/46  Pulse: 83 80  85  Temp:   97.8 F (36.6 C)   TempSrc:   Oral   Resp: 16 18  14   Height:      Weight:      SpO2: 90% 95%  98%    Intake/Output from previous day:  Intake/Output Summary (Last 24 hours) at 10/21/12 0912 Last data filed at 10/21/12 0800  Gross per 24 hour  Intake   2680 ml  Output    650 ml  Net   2030 ml    Physical Exam: Affect appropriate Healthy:  appears stated age HEENT: normal Neck supple with no adenopathy JVP normal no bruits no thyromegaly Lungs clear with no wheezing and good diaphragmatic motion Heart:  S1/S2 no murmur, no rub, gallop or click PMI normal Abdomen: benighn, BS positve, no tenderness, no AAA no bruit.  No HSM or HJR Distal pulses intact with no bruits No edema Neuro non-focal Skin warm and dry Left knee in wrap    Lab Results: Basic Metabolic Panel:  Recent Labs  78/29/56 1910 10/21/12 0245  NA 140 139  K 4.3 3.9  CL 103 104  CO2 29 28  GLUCOSE 124* 127*  BUN 13 11  CREATININE 0.94 0.86  CALCIUM 9.8 8.6   Liver Function Tests:  Recent Labs  10/21/12 0245  AST 20  ALT 15  ALKPHOS 43  BILITOT 0.7  PROT 6.0  ALBUMIN 3.1*   CBC:  Recent Labs  10/19/12 1910 10/21/12 0245  WBC 9.7 10.0  NEUTROABS 7.1  --   HGB 13.0 12.1  HCT 37.9 35.3*  MCV 93.1 92.9  PLT 212 160   Thyroid Function Tests:  Recent Labs  10/20/12 1418  TSH 0.954    Imaging: Dg Chest 1 View  10/20/2012   CLINICAL DATA:  Postoperative evaluation. Assess endotracheal tube placement.  EXAM: CHEST - 1 VIEW  COMPARISON:  CHEST x-ray 10/19/2012.  FINDINGS: An endotracheal tube is in place with tip 3.9 cm above the carina. Lung volumes are low. There is cephalization  of the pulmonary vasculature and slight indistinctness of the interstitial markings suggestive of mild pulmonary edema. No definite pleural effusions. Mild cardiomegaly. The patient is rotated to the right on today's exam, resulting in distortion of the mediastinal contours and reduced diagnostic sensitivity and specificity for mediastinal pathology. Orthopedic fixation hardware in the lower cervical spine.  IMPRESSION: 1. Support apparatus and postoperative changes, as above. 2. Mild cardiomegaly with mild interstitial pulmonary edema.   Electronically Signed   By: Trudie Reed M.D.   On: 10/20/2012 18:20   Dg Ribs Unilateral W/chest Left  10/19/2012   CLINICAL DATA:  Fall with left chest pain.  EXAM: LEFT RIBS AND CHEST - 3+ VIEW  COMPARISON:  Chest x-ray on 06/12/2008  FINDINGS: No fracture or other bone lesions are seen involving the ribs. There is no evidence of pneumothorax or pleural effusion. Both lungs are clear. Heart size and mediastinal contours are within normal limits.  IMPRESSION: No acute left rib fracture identified.   Electronically Signed   By: Irish Lack   On: 10/19/2012 17:44   Ct Knee Left Wo Contrast  10/19/2012   CLINICAL DATA:  Patient with a tibial plateau fracture. This is for further delineation.  EXAM: CT OF THE LEFT KNEE WITHOUT CONTRAST  TECHNIQUE: Multidetector CT imaging was performed according to the standard protocol. Multiplanar CT image reconstructions were also generated.  COMPARISON:  Standard radiographs, 10/19/2012  FINDINGS: There is a complex, comminuted fracture of the proximal tibia. The primary fracture line is along the sagittal plane, slightly oblique, extending from below the tibial spine to the anterior lateral aspect of the proximal tibia at the metadiaphysis. At the articular surface, this fracture is displaced laterally by 7 mm. Comminuted fracture fragments are depressed centrally, 1 cm inferiorly from the expected location of the lateral tibial  plateau articular surface. A another transverse oblique fracture line extends across the medial tibial metaphysis. Fracture lines also undermined the base of the tibial spine and intersect the medial margin of the medial articular surface.  No femur or fibular fracture. There is no patellar fracture. A moderate joint effusion with a fat fluid level is noted. Both the cruciate ligament insertions likely attached to fracture bony fragments.  IMPRESSION: Extensively comminuted, mildly displaced fracture of the proximal left tibia. The fracture primarily involves the lateral tibial plateau with depression of the central articular service by in maximal 1 cm. Fracture extends inferiorly to the mid-diaphysis and there is a more simple appearing nondisplaced fracture of the medial tibial metaphysis.   Electronically Signed   By: Amie Portland   On: 10/19/2012 20:13   Dg Chest Port 1 View  10/21/2012   CLINICAL DATA:  Extubation  EXAM: PORTABLE CHEST - 1 VIEW  COMPARISON:  10/20/2012  FINDINGS: Endotracheal tube has been removed. Improved aeration since prior study. Improvement of bilateral edema. Small left effusion is present. Mild bibasilar atelectasis.  IMPRESSION: Improved aeration of the lungs following extubation.  Small left effusion and bibasilar atelectasis remain.   Electronically Signed   By: Marlan Palau M.D.   On: 10/21/2012 08:32   Dg Knee Complete 4 Views Left  10/19/2012   CLINICAL DATA:  Fall with left knee pain.  EXAM: LEFT KNEE - COMPLETE 4+ VIEW  COMPARISON:  None.  FINDINGS: There is evidence of an acute fracture of the proximal tibia involving the central joint space and extending into the lateral tibial plateau and lateral metaphysis. Comminution and displacement noted with the greatest degree of measurable displacement of approximately 7 mm. Some joint fluid is present.  IMPRESSION: Comminuted proximal tibial fracture involving the central joint space and extending into the lateral tibial  plateau and lateral metaphysis.   Electronically Signed   By: Irish Lack   On: 10/19/2012 17:48   Dg Knee Left Port  10/21/2012   CLINICAL DATA:  Postop left knee.  EXAM: PORTABLE LEFT KNEE - 1-2 VIEW  COMPARISON:  Fluoroscopy from earlier the same day.  FINDINGS: There may have been elevation of the most severely depressed portions of a lateral tibial plateau fracture. There is still distraction of the fracture laterally. Lipohemarthrosis. No evidence of fracture near external fixation pins.  IMPRESSION: External fixation of a tibial plateau fracture.   Electronically Signed   By: Tiburcio Pea   On: 10/21/2012 02:52   Dg C-arm 1-60 Min  10/20/2012   CLINICAL DATA:  External fixator placement. Tibial plateau fracture.  EXAM: LEFT KNEE - 3 VIEW; DG C-ARM 1-60 MIN  COMPARISON:  CT 10/19/2012  FINDINGS: Two intraoperative spot images again demonstrate the comminuted proximal left tibial fracture. Portions of the external fixator device noted  crossing the knee joint.  IMPRESSION: Proximal left tibial fracture. Intraoperative imaging for external fixator device.   Electronically Signed   By: Charlett Nose M.D.   On: 10/20/2012 16:24   Dg Knee 2 Views Left  10/20/2012   CLINICAL DATA:  External fixator placement. Tibial plateau fracture.  EXAM: LEFT KNEE - 3 VIEW; DG C-ARM 1-60 MIN  COMPARISON:  CT 10/19/2012  FINDINGS: Two intraoperative spot images again demonstrate the comminuted proximal left tibial fracture. Portions of the external fixator device noted crossing the knee joint.  IMPRESSION: Proximal left tibial fracture. Intraoperative imaging for external fixator device.   Electronically Signed   By: Charlett Nose M.D.   On: 10/20/2012 16:24    Cardiac Studies:  ECG:   SR no ischemic changes PVC;s    Telemetry:  SR PVC;s no VT  Echo: Normal EF no significant valvular disease  Medications:   . docusate sodium  100 mg Oral BID  . enoxaparin (LOVENOX) injection  40 mg Subcutaneous Q24H  .  metoprolol tartrate  12.5 mg Oral BID  . pantoprazole (PROTONIX) IV  40 mg Intravenous Q24H     . 0.9 % NaCl with KCl 20 mEq / L 75 mL/hr at 10/20/12 2000    Assessment/Plan:  Cards:  Stable with no signs of ischemia or CHF.  PVCs asymptomatic  Continue beta blocker and pain control  Charlton Haws 10/21/2012, 9:12 AM

## 2012-10-22 MED ORDER — PANTOPRAZOLE SODIUM 40 MG PO TBEC
40.0000 mg | DELAYED_RELEASE_TABLET | Freq: Every day | ORAL | Status: DC
  Administered 2012-10-23: 40 mg via ORAL
  Filled 2012-10-22: qty 1

## 2012-10-22 NOTE — Progress Notes (Signed)
Clinical Social Work Department  BRIEF PSYCHOSOCIAL ASSESSMENT  Patient: [name] Account [id number]   Admit date: 10/19/12 Clinical Social Worker Sabino Niemann, MSW Date/Time: 10/22/2012  3:00 PM Referred by: Physician Date Referred:  Referred for   SNF Placement   Other Referral:  Interview type: Patient  Other interview type: PSYCHOSOCIAL DATA  Living Status:Husband Admitted from facility:  Level of care:  Primary support name: Onalee Hua Primary support relationship to patient: Husband Degree of support available:  Strong and vested  CURRENT CONCERNS  Current Concerns   Post-Acute Placement   Other Concerns:  SOCIAL WORK ASSESSMENT / PLAN  CSW met with pt re: PT recommendation for SNF.   Pt lives with her husband  CSW explained placement process and answered questions.   Pt reports [facility] as her preference    CSW completed FL2 and initiated SNF search.     Assessment/plan status: Information/Referral to [facility]  Other assessment/ plan:  Information/referral to community resources:  SNF     PATIENT'S/FAMILY'S RESPONSE TO PLAN OF CARE:  Pt  reports she is agreeable to [facility] SNF in order to increase strength and independence with mobility prior to returning home  Pt verbalized understanding of placement process and appreciation for CSW assist.   Sabino Niemann, MSW [phone]

## 2012-10-22 NOTE — Progress Notes (Signed)
Physical Therapy Treatment Patient Details Name: [name] MRN: [id number] DOB: 1955/12/29 Today's Date: 10/22/2012 Time: 4098-1191 PT Time Calculation (min): 20 min  PT Assessment / Plan / Recommendation  History of Present Illness Pt tripped and fell after hiking, sustaining left tibial plateau fx and underwent external fixation. SHe has a history of bilateral posterior approach hip replacements (both more than a yr ago) as well as right shoulder surgery for OA.    PT Comments     Follow Up Recommendations  SNF;Supervision/Assistance - 24 hour     Does the patient have the potential to tolerate intense rehabilitation     Barriers to Discharge        Equipment Recommendations  3in1 (PT);Wheelchair (measurements PT)    Recommendations for Other Services OT consult  Frequency Min 4X/week   Progress towards PT Goals Progress towards PT goals: Progressing toward goals  Plan Current plan remains appropriate    Precautions / Restrictions Precautions Precautions: Fall Required Braces or Orthoses: Other Brace/Splint Other Brace/Splint: external fixation Restrictions Weight Bearing Restrictions: Yes LLE Weight Bearing: Non weight bearing   Pertinent Vitals/Pain 4/10; premed, ice packs provided    Mobility  Bed Mobility Bed Mobility: Supine to Sit Supine to Sit: 1: +2 Total assist;With rails Supine to Sit: Patient Percentage: 60% Sitting - Scoot to Edge of Bed: 4: Min assist Details for Bed Mobility Assistance: cues for sequencing and use of R LE to self assist; physical assist to manage L LE and to bring trunk to upright Transfers Transfers: Sit to Stand;Stand to Sit Sit to Stand: 1: +2 Total assist;With upper extremity assist;From bed Sit to Stand: Patient Percentage: 70% Stand to Sit: 1: +2 Total assist;With upper extremity assist;To chair/3-in-1;With armrests Stand to Sit: Patient Percentage: 70% Details for Transfer Assistance: cues for use of UEs to self  assist; physical assist to bring wt fwd and up and to manage L LE during move to chair Ambulation/Gait Ambulation/Gait Assistance: 1: +2 Total assist Ambulation/Gait: Patient Percentage: 70% Ambulation Distance (Feet): 2 Feet Assistive device: Rolling walker Ambulation/Gait Assistance Details: pt required assist to maintain L LE off floor and NWB Gait Pattern: Shuffle;Decreased step length - right Stairs: No    Exercises     PT Diagnosis:    PT Problem List:   PT Treatment Interventions:     PT Goals (current goals can now be found in the care plan section) Acute Rehab PT Goals Patient Stated Goal: To get better PT Goal Formulation: With patient Time For Goal Achievement: 11/04/12 Potential to Achieve Goals: Good  Visit Information  Last PT Received On: 10/22/12 Assistance Needed: +2 History of Present Illness: Pt tripped and fell after hiking, sustaining left tibial plateau fx and underwent external fixation. SHe has a history of bilateral posterior approach hip replacements (both more than a yr ago) as well as right shoulder surgery for OA.     Subjective Data  Subjective: I can't put that on the floor Patient Stated Goal: To get better   Cognition  Cognition Arousal/Alertness: Awake/alert Behavior During Therapy: WFL for tasks assessed/performed Overall Cognitive Status: Within Functional Limits for tasks assessed    Balance  Static Standing Balance Static Standing - Balance Support: Bilateral upper extremity supported;During functional activity  End of Session PT - End of Session Equipment Utilized During Treatment: Gait belt Activity Tolerance: Patient tolerated treatment well Patient left: in chair;with call bell/phone within reach Nurse Communication: Mobility status   GP     [name] 10/22/2012,  11:36 AM

## 2012-10-22 NOTE — Progress Notes (Signed)
Patient ID: [name], female   DOB: 11/16/1955, 57 y.o.   MRN: [id number]    Subjective:  Denies SSCP, palpitations or Dyspnea Significant knee pain  Dressing change today   Objective:  Filed Vitals:   10/21/12 1041 10/21/12 1217 10/21/12 1315 10/21/12 2026  BP: 123/51 120/88 102/53 136/56  Pulse: 60 76 51 86  Temp: 98.3 F (36.8 C) 98.5 F (36.9 C) 98.8 F (37.1 C) 99.2 F (37.3 C)  TempSrc: Oral   Oral  Resp: 18  20 16   Height:      Weight:      SpO2: 93% 97% 95% 97%    Intake/Output from previous day:  Intake/Output Summary (Last 24 hours) at 10/22/12 9629 Last data filed at 10/22/12 5284  Gross per 24 hour  Intake   1530 ml  Output    900 ml  Net    630 ml    Physical Exam: Affect appropriate Healthy:  appears stated age HEENT: normal Neck supple with no adenopathy JVP normal no bruits no thyromegaly Lungs clear with no wheezing and good diaphragmatic motion Heart:  S1/S2 no murmur, no rub, gallop or click PMI normal Abdomen: benighn, BS positve, no tenderness, no AAA no bruit.  No HSM or HJR Distal pulses intact with no bruits No edema Neuro non-focal Skin warm and dry Left knee in wrap    Lab Results: Basic Metabolic Panel:  Recent Labs  13/24/40 1910 10/21/12 0245  NA 140 139  K 4.3 3.9  CL 103 104  CO2 29 28  GLUCOSE 124* 127*  BUN 13 11  CREATININE 0.94 0.86  CALCIUM 9.8 8.6   Liver Function Tests:  Recent Labs  10/21/12 0245  AST 20  ALT 15  ALKPHOS 43  BILITOT 0.7  PROT 6.0  ALBUMIN 3.1*   CBC:  Recent Labs  10/19/12 1910 10/21/12 0245  WBC 9.7 10.0  NEUTROABS 7.1  --   HGB 13.0 12.1  HCT 37.9 35.3*  MCV 93.1 92.9  PLT 212 160   Thyroid Function Tests:  Recent Labs  10/20/12 1418  TSH 0.954    Imaging: Dg Chest 1 View  10/20/2012   CLINICAL DATA:  Postoperative evaluation. Assess endotracheal tube placement.  EXAM: CHEST - 1 VIEW  COMPARISON:  CHEST x-ray 10/19/2012.  FINDINGS: An endotracheal  tube is in place with tip 3.9 cm above the carina. Lung volumes are low. There is cephalization of the pulmonary vasculature and slight indistinctness of the interstitial markings suggestive of mild pulmonary edema. No definite pleural effusions. Mild cardiomegaly. The patient is rotated to the right on today's exam, resulting in distortion of the mediastinal contours and reduced diagnostic sensitivity and specificity for mediastinal pathology. Orthopedic fixation hardware in the lower cervical spine.  IMPRESSION: 1. Support apparatus and postoperative changes, as above. 2. Mild cardiomegaly with mild interstitial pulmonary edema.   Electronically Signed   By: Trudie Reed M.D.   On: 10/20/2012 18:20   Dg Chest Port 1 View  10/21/2012   CLINICAL DATA:  Extubation  EXAM: PORTABLE CHEST - 1 VIEW  COMPARISON:  10/20/2012  FINDINGS: Endotracheal tube has been removed. Improved aeration since prior study. Improvement of bilateral edema. Small left effusion is present. Mild bibasilar atelectasis.  IMPRESSION: Improved aeration of the lungs following extubation.  Small left effusion and bibasilar atelectasis remain.   Electronically Signed   By: Marlan Palau M.D.   On: 10/21/2012 08:32   Dg Knee Left Port  10/21/2012  CLINICAL DATA:  Postop left knee.  EXAM: PORTABLE LEFT KNEE - 1-2 VIEW  COMPARISON:  Fluoroscopy from earlier the same day.  FINDINGS: There may have been elevation of the most severely depressed portions of a lateral tibial plateau fracture. There is still distraction of the fracture laterally. Lipohemarthrosis. No evidence of fracture near external fixation pins.  IMPRESSION: External fixation of a tibial plateau fracture.   Electronically Signed   By: Tiburcio Pea   On: 10/21/2012 02:52   Dg C-arm 1-60 Min  10/20/2012   CLINICAL DATA:  External fixator placement. Tibial plateau fracture.  EXAM: LEFT KNEE - 3 VIEW; DG C-ARM 1-60 MIN  COMPARISON:  CT 10/19/2012  FINDINGS: Two  intraoperative spot images again demonstrate the comminuted proximal left tibial fracture. Portions of the external fixator device noted crossing the knee joint.  IMPRESSION: Proximal left tibial fracture. Intraoperative imaging for external fixator device.   Electronically Signed   By: Charlett Nose M.D.   On: 10/20/2012 16:24   Dg Knee 2 Views Left  10/20/2012   CLINICAL DATA:  External fixator placement. Tibial plateau fracture.  EXAM: LEFT KNEE - 3 VIEW; DG C-ARM 1-60 MIN  COMPARISON:  CT 10/19/2012  FINDINGS: Two intraoperative spot images again demonstrate the comminuted proximal left tibial fracture. Portions of the external fixator device noted crossing the knee joint.  IMPRESSION: Proximal left tibial fracture. Intraoperative imaging for external fixator device.   Electronically Signed   By: Charlett Nose M.D.   On: 10/20/2012 16:24    Cardiac Studies:  ECG:   SR no ischemic changes PVC;s    Telemetry:  SR PVC;s no VT  Echo: Normal EF no significant valvular disease  Medications:   . docusate sodium  100 mg Oral BID  . enoxaparin (LOVENOX) injection  40 mg Subcutaneous Q24H  . metoprolol tartrate  12.5 mg Oral BID  . pantoprazole (PROTONIX) IV  40 mg Intravenous Q24H     . 0.9 % NaCl with KCl 20 mEq / L 75 mL/hr at 10/21/12 1129    Assessment/Plan:  Cards:  Stable with no signs of ischemia or CHF.  PVCs asymptomatic  Continue beta blocker and pain control Increase to 25 bid Ortho:  Per Dr Carola Frost will need SNF and f/u surgery when soft tissues allow  Charlton Haws 10/22/2012, 8:32 AM

## 2012-10-22 NOTE — Progress Notes (Signed)
Orthopaedic Trauma Service (OTS)  Subjective: 2 Days Post-Op Procedure(s) (LRB): EXTERNAL FIXATION Left Tibial Plateau Fx (Left) Patient reports pain as moderate.   Mobilized to chair today with considerable assistance. Awaiting names for rehab. C/o right arm swelling below IV site.  Objective: Current Vitals Blood pressure 136/56, pulse 86, temperature 99.2 F (37.3 C), temperature source Oral, resp. rate 16, height 5\' 3"  (1.6 m), weight 107.2 kg (236 lb 5.3 oz), SpO2 97.00%. Vital signs in last 24 hours: Temp:  [98.8 F (37.1 C)-99.2 F (37.3 C)] 99.2 F (37.3 C) (09/17 2026) Pulse Rate:  [51-86] 86 (09/17 2026) Resp:  [16-20] 16 (09/17 2026) BP: (102-136)/(53-56) 136/56 mmHg (09/17 2026) SpO2:  [95 %-97 %] 97 % (09/17 2026)  Intake/Output from previous day: 09/17 0701 - 09/18 0700 In: 1855 [P.O.:600; I.V.:1200; IV Piggyback:55] Out: 900 [Urine:900]  LABS  Recent Labs  10/19/12 1910 10/21/12 0245  HGB 13.0 12.1    Recent Labs  10/19/12 1910 10/21/12 0245  WBC 9.7 10.0  RBC 4.07 3.80*  HCT 37.9 35.3*  PLT 212 160    Recent Labs  10/19/12 1910 10/21/12 0245  NA 140 139  K 4.3 3.9  CL 103 104  CO2 29 28  BUN 13 11  CREATININE 0.94 0.86  GLUCOSE 124* 127*  CALCIUM 9.8 8.6   No results found for this basename: LABPT, INR,  in the last 72 hours   Physical Exam  Upright in chair, no wheezing LLE Sens DPN, SPN, TN intact  Motor EHL, ext, flex, evers intact  DP 2+  Slight sanguinous drainage; edema well controlled  Imaging Dg Chest 1 View  10/20/2012   CLINICAL DATA:  Postoperative evaluation. Assess endotracheal tube placement.  EXAM: CHEST - 1 VIEW  COMPARISON:  CHEST x-ray 10/19/2012.  FINDINGS: An endotracheal tube is in place with tip 3.9 cm above the carina. Lung volumes are low. There is cephalization of the pulmonary vasculature and slight indistinctness of the interstitial markings suggestive of mild pulmonary edema. No definite pleural  effusions. Mild cardiomegaly. The patient is rotated to the right on today's exam, resulting in distortion of the mediastinal contours and reduced diagnostic sensitivity and specificity for mediastinal pathology. Orthopedic fixation hardware in the lower cervical spine.  IMPRESSION: 1. Support apparatus and postoperative changes, as above. 2. Mild cardiomegaly with mild interstitial pulmonary edema.   Electronically Signed   By: Trudie Reed M.D.   On: 10/20/2012 18:20   Dg Chest Port 1 View  10/21/2012   CLINICAL DATA:  Extubation  EXAM: PORTABLE CHEST - 1 VIEW  COMPARISON:  10/20/2012  FINDINGS: Endotracheal tube has been removed. Improved aeration since prior study. Improvement of bilateral edema. Small left effusion is present. Mild bibasilar atelectasis.  IMPRESSION: Improved aeration of the lungs following extubation.  Small left effusion and bibasilar atelectasis remain.   Electronically Signed   By: Marlan Palau M.D.   On: 10/21/2012 08:32   Dg Knee Left Port  10/21/2012   CLINICAL DATA:  Postop left knee.  EXAM: PORTABLE LEFT KNEE - 1-2 VIEW  COMPARISON:  Fluoroscopy from earlier the same day.  FINDINGS: There may have been elevation of the most severely depressed portions of a lateral tibial plateau fracture. There is still distraction of the fracture laterally. Lipohemarthrosis. No evidence of fracture near external fixation pins.  IMPRESSION: External fixation of a tibial plateau fracture.   Electronically Signed   By: Tiburcio Pea   On: 10/21/2012 02:52   Dg C-arm  1-60 Min  10/20/2012   CLINICAL DATA:  External fixator placement. Tibial plateau fracture.  EXAM: LEFT KNEE - 3 VIEW; DG C-ARM 1-60 MIN  COMPARISON:  CT 10/19/2012  FINDINGS: Two intraoperative spot images again demonstrate the comminuted proximal left tibial fracture. Portions of the external fixator device noted crossing the knee joint.  IMPRESSION: Proximal left tibial fracture. Intraoperative imaging for external fixator  device.   Electronically Signed   By: Charlett Nose M.D.   On: 10/20/2012 16:24   Dg Knee 2 Views Left  10/20/2012   CLINICAL DATA:  External fixator placement. Tibial plateau fracture.  EXAM: LEFT KNEE - 3 VIEW; DG C-ARM 1-60 MIN  COMPARISON:  CT 10/19/2012  FINDINGS: Two intraoperative spot images again demonstrate the comminuted proximal left tibial fracture. Portions of the external fixator device noted crossing the knee joint.  IMPRESSION: Proximal left tibial fracture. Intraoperative imaging for external fixator device.   Electronically Signed   By: Charlett Nose M.D.   On: 10/20/2012 16:24    Assessment/Plan: 2 Days Post-Op Procedure(s) (LRB): EXTERNAL FIXATION Left Tibial Plateau Fx (Left)  Dressing change tomorrow before d/c SW NWB on LLE   Myrene Galas, MD [facility] [phone] (p)  10/22/2012, 12:43 PM

## 2012-10-22 NOTE — Progress Notes (Signed)
Physical Therapy Treatment Patient Details Name: [name] MRN: [id number] DOB: 09-Jul-1955 Today's Date: 10/22/2012 Time: 5784-6962 PT Time Calculation (min): 9 min  PT Assessment / Plan / Recommendation  History of Present Illness Pt tripped and fell after hiking, sustaining left tibial plateau fx and underwent external fixation. SHe has a history of bilateral posterior approach hip replacements (both more than a yr ago) as well as right shoulder surgery for OA.    PT Comments     Follow Up Recommendations  SNF;Supervision/Assistance - 24 hour     Does the patient have the potential to tolerate intense rehabilitation     Barriers to Discharge        Equipment Recommendations  3in1 (PT);Wheelchair (measurements PT)    Recommendations for Other Services OT consult  Frequency Min 4X/week   Progress towards PT Goals Progress towards PT goals: Progressing toward goals  Plan Current plan remains appropriate    Precautions / Restrictions Precautions Precautions: Fall Required Braces or Orthoses: Other Brace/Splint Other Brace/Splint: external fixation Restrictions Weight Bearing Restrictions: Yes LLE Weight Bearing: Non weight bearing   Pertinent Vitals/Pain 4/10; premed    Mobility  Bed Mobility Bed Mobility: Sit to Supine Supine to Sit: 1: +2 Total assist;With rails Supine to Sit: Patient Percentage: 60% Sitting - Scoot to Edge of Bed: 4: Min assist Sit to Supine: 3: Mod assist;HOB flat;With rail Details for Bed Mobility Assistance: cues for sequencing and use of R LE to self assist; physical assist to manage L LE  Transfers Transfers: Sit to Stand;Stand to Sit Sit to Stand: 1: +2 Total assist;With upper extremity assist;From chair/3-in-1 Sit to Stand: Patient Percentage: 80% Stand to Sit: 1: +2 Total assist;With upper extremity assist;To bed Stand to Sit: Patient Percentage: 80% Details for Transfer Assistance: cues for use of UEs to self assist; physical  assist to bring wt fwd and up and to manage L LE  Ambulation/Gait Ambulation/Gait Assistance: 1: +2 Total assist Ambulation/Gait: Patient Percentage: 80% Ambulation Distance (Feet): 2 Feet Assistive device: Rolling walker Ambulation/Gait Assistance Details: Assist of 1 for balance and RW management and 2nd assist to manage L LE Gait Pattern: Shuffle;Decreased step length - right Stairs: No    Exercises     PT Diagnosis:    PT Problem List:   PT Treatment Interventions:     PT Goals (current goals can now be found in the care plan section) Acute Rehab PT Goals Patient Stated Goal: To get better PT Goal Formulation: With patient Time For Goal Achievement: 11/04/12 Potential to Achieve Goals: Good  Visit Information  Last PT Received On: 10/22/12 Assistance Needed: +2 History of Present Illness: Pt tripped and fell after hiking, sustaining left tibial plateau fx and underwent external fixation. SHe has a history of bilateral posterior approach hip replacements (both more than a yr ago) as well as right shoulder surgery for OA.     Subjective Data  Subjective: I can't put that on the floor Patient Stated Goal: To get better   Cognition  Cognition Arousal/Alertness: Awake/alert Behavior During Therapy: WFL for tasks assessed/performed Overall Cognitive Status: Within Functional Limits for tasks assessed    Balance  Static Standing Balance Static Standing - Balance Support: Bilateral upper extremity supported;During functional activity  End of Session PT - End of Session Equipment Utilized During Treatment: Gait belt Activity Tolerance: Patient tolerated treatment well Patient left: in bed;with call bell/phone within reach;with family/visitor present Nurse Communication: Mobility status   GP  Jazlyne Gauger 10/22/2012, 2:34 PM

## 2012-10-22 NOTE — Progress Notes (Signed)
Patient IV infiltrated. Right arm cool and swollen. IV removed and heat pack applied.

## 2012-10-22 NOTE — Consult Note (Signed)
I have seen and examined the patient. I agree with the findings above.  I discussed with the patient the risks and benefits of surgery, including the possibility of infection, nerve injury, vessel injury, wound breakdown, arthritis, symptomatic hardware, DVT/ PE, loss of motion, and need for further surgery among others.  We also specifically discussed the need to stage surgery because of the elevated risk of soft tissue breakdown that could lead to amputation.  She understood these risks and wished to proceed.   Budd Palmer, MD

## 2012-10-23 ENCOUNTER — Inpatient Hospital Stay
Admission: RE | Admit: 2012-10-23 | Discharge: 2012-10-24 | Disposition: A | Discharge status: Home / Self Care | Payer: 59 | Source: Ambulatory Visit | Attending: Internal Medicine | Admitting: Internal Medicine

## 2012-10-23 MED ORDER — ADULT MULTIVITAMIN W/MINERALS CH: 1.0000 | ORAL_TABLET | Freq: Every day | ORAL | Status: DC

## 2012-10-23 MED ORDER — METHOCARBAMOL 500 MG PO TABS: 500.0000 mg | ORAL_TABLET | Freq: Four times a day (QID) | ORAL | Status: DC | PRN

## 2012-10-23 MED ORDER — METOPROLOL TARTRATE 12.5 MG HALF TABLET: 12.5000 mg | ORAL_TABLET | Freq: Two times a day (BID) | ORAL | Status: DC

## 2012-10-23 MED ORDER — VITAMIN D3 25 MCG (1000 UNIT) PO TABS
1000.0000 [IU] | ORAL_TABLET | Freq: Two times a day (BID) | ORAL | Status: DC
  Administered 2012-10-23: 1000 [IU] via ORAL
  Filled 2012-10-23 (×2): qty 1

## 2012-10-23 MED ORDER — ENOXAPARIN SODIUM 40 MG/0.4ML ~~LOC~~ SOLN: 40.0000 mg | SUBCUTANEOUS | Status: DC

## 2012-10-23 MED ORDER — DSS 100 MG PO CAPS: 100.0000 mg | ORAL_CAPSULE | Freq: Two times a day (BID) | ORAL | Status: DC

## 2012-10-23 MED ORDER — VITAMIN D3 25 MCG (1000 UNIT) PO TABS: 1000.0000 [IU] | ORAL_TABLET | Freq: Two times a day (BID) | ORAL | Status: DC

## 2012-10-23 MED ORDER — ADULT MULTIVITAMIN W/MINERALS CH
1.0000 | ORAL_TABLET | Freq: Every day | ORAL | Status: DC
  Administered 2012-10-23: 1 via ORAL
  Filled 2012-10-23: qty 1

## 2012-10-23 MED ORDER — HYDROCODONE-ACETAMINOPHEN 5-325 MG PO TABS: 1.0000 | ORAL_TABLET | Freq: Four times a day (QID) | ORAL | Status: DC | PRN

## 2012-10-23 MED ORDER — PANTOPRAZOLE SODIUM 40 MG PO TBEC: 40.0000 mg | DELAYED_RELEASE_TABLET | Freq: Every day | ORAL | Status: DC

## 2012-10-23 MED ORDER — ACETAMINOPHEN 325 MG PO TABS: 325.0000 mg | ORAL_TABLET | Freq: Four times a day (QID) | ORAL | Status: DC | PRN
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
## 2012-10-23 NOTE — Clinical Social Work Placement (Signed)
Clinical Social Work Department  CLINICAL SOCIAL WORK PLACEMENT NOTE  10/23/2012  Patient: ELIZABETHANNE LUSHER Account Number: [id number]  Admit date: 10/21/12  Clinical Social Worker: Sabino Niemann LCSWA Date/time: 10/22/12 11:30 AM  Clinical Social Work is seeking post-discharge placement for this patient at the following level of care: SKILLED NURSING (*CSW will update this form in Epic as items are completed)  10/22/12 Patient/family provided with [facility] Department of Clinical Social Work's list of facilities offering this level of care within the geographic area requested by the patient (or if unable, by the patient's family).  10/22/12 Patient/family informed of their freedom to choose among providers that offer the needed level of care, that participate in Medicare, Medicaid or managed care program needed by the patient, have an available bed and are willing to accept the patient.  10/22/12 Patient/family informed of [facility]' ownership interest in [facility], as well as of the fact that they are under no obligation to receive care at this facility.  PASARR submitted to EDS on 10/22/12  PASARR number received from [employer] on 10/22/12  FL2 transmitted to all facilities in geographic area requested by pt/family on 10/22/12  FL2 transmitted to all facilities within larger geographic area on  Patient informed that his/her managed care company has contracts with or will negotiate with certain facilities, including the following:  Patient/family informed of bed offers received: 10/22/12  Patient chooses bed at [facility] nursing center  Physician recommends and patient chooses bed at  Patient to be transferred to on 10/23/12  Patient to be transferred to facility by [facility] The following physician request were entered in Epic:  Additional Comments:  Signing off

## 2012-10-23 NOTE — Progress Notes (Signed)
[facility]edic [facility] Progress Note  Subjective  Doing much better Pain controlled No new issues  Ready to go to snf  Review of Systems  Constitutional: Negative for fever and chills.  Eyes: Negative for blurred vision.  Respiratory: Negative for shortness of breath and wheezing.   Cardiovascular: Negative for chest pain and palpitations.  Gastrointestinal: Negative for nausea, vomiting and abdominal pain.  Neurological: Negative for tingling, sensory change and headaches.    Objective   BP 144/55  Pulse 84  Temp(Src) 98.4 F (36.9 C) (Oral)  Resp 18  Ht 5\' 3"  (1.6 m)  Wt 107.2 kg (236 lb 5.3 oz)  BMI 41.88 kg/m2  SpO2 97%  Intake/Output     09/18 0701 - 09/19 0700 09/19 0701 - 09/20 0700   P.O. 720    I.V. (mL/kg)     IV Piggyback     Total Intake(mL/kg) 720 (6.7)    Urine (mL/kg/hr) 1550 (0.6)    Total Output 1550     Net -830           Labs Results for [name], [name] (MRN [id number]) as of 10/23/2012 13:07  Ref. Range 10/21/2012 02:45  Sodium Latest Range: 135-145 mEq/L 139  Potassium Latest Range: 3.5-5.1 mEq/L 3.9  Chloride Latest Range: 96-112 mEq/L 104  CO2 Latest Range: 19-32 mEq/L 28  BUN Latest Range: 6-23 mg/dL 11  Creatinine Latest Range: 0.50-1.10 mg/dL 7.82  Calcium Latest Range: 8.4-10.5 mg/dL 8.6  GFR calc non Af Amer Latest Range: >90 mL/min 73 (L)  GFR calc Af Amer Latest Range: >90 mL/min 85 (L)  Glucose Latest Range: 70-99 mg/dL 956 (H)  Calcium, Total (PTH) Latest Range: 8.4-10.5 mg/dL 8.0 (L)  Alkaline Phosphatase Latest Range: 39-117 U/L 43  Albumin Latest Range: 3.5-5.2 g/dL 3.1 (L)  AST Latest Range: 0-37 U/L 20  ALT Latest Range: 0-35 U/L 15  Total Protein Latest Range: 6.0-8.3 g/dL 6.0  Total Bilirubin Latest Range: 0.3-1.2 mg/dL 0.7  Prealbumin Latest Range: 17.0-34.0 mg/dL 21.3 (L)  Vit D, 08-MVHQION Latest Range: 30-89 ng/mL 31  WBC Latest Range: 4.0-10.5 K/uL 10.0  RBC Latest Range: 3.87-5.11 MIL/uL 3.80 (L)   Hemoglobin Latest Range: 12.0-15.0 g/dL 62.9  HCT Latest Range: 36.0-46.0 % 35.3 (L)  MCV Latest Range: 78.0-100.0 fL 92.9  MCH Latest Range: 26.0-34.0 pg 31.8  MCHC Latest Range: 30.0-36.0 g/dL 52.8  RDW Latest Range: 11.5-15.5 % 13.1  Platelets Latest Range: 150-400 K/uL 160  PTH Latest Range: 14.0-72.0 pg/mL 77.2 (H)     Exam   Gen: awake and alert, appears comfortable Lungs: clear anterior fields Cardiac:s1 and s2 Abd: soft, + BS, NT Ext:    Left Lower Extremity           Ex fix stable           pinsites look good           dressings changed today            EHL, FHL, AT, PT, peroneals, gastrocsoleus motor intact           Ext warm           + DP pulse             No pain with passive stretch  Swelling improving   Assessment and Plan  57 y/o female s/p fall with complex L tibial plateau fracture  1. Fall 2. L bicondylar tibial plateau fracture, shatzker V, OTA 41-C3, POD 3 ex fix Left leg  NWB               Ice and elevate             Ace wrap             ROM L toes and ankles as tolerated             PT/OT consults             Dressing changes daily   Ok to shower in ex fix                           3. Cardiac issues            appreciate cards assistance     4. GERD             Protonix daily   5. DVT/PE prophylaxis            lovenox until next surgery    6. FEN           diet as tolerated    7. Pain               Minimize narcotics             Continue with tylenol and robaxin                8. Metabolic Bone disease, obesity            pt with elevated PTH and borderline low vitamin D.  Will supplement with vitamin D3 1000 IU BID, corrected calcium is normal   Recheck iPTH in 8 weeks   9. Dispo             stable for d/c to SNF today   F/u with ortho in 1 week  Will arrange cards follow up   Cassandra Latin, PA-C [facility] [phone] (P) 10/23/2012 1:20 PM

## 2012-10-25 ENCOUNTER — Non-Acute Institutional Stay (SKILLED_NURSING_FACILITY): Payer: 59 | Admitting: Internal Medicine

## 2012-10-25 DIAGNOSIS — IMO0002 Reserved for concepts with insufficient information to code with codable children: Secondary | ICD-10-CM

## 2012-10-25 DIAGNOSIS — K219 Gastro-esophageal reflux disease without esophagitis: Secondary | ICD-10-CM

## 2012-10-25 DIAGNOSIS — I4949 Other premature depolarization: Secondary | ICD-10-CM

## 2012-10-25 DIAGNOSIS — H409 Unspecified glaucoma: Secondary | ICD-10-CM

## 2012-10-25 DIAGNOSIS — J309 Allergic rhinitis, unspecified: Secondary | ICD-10-CM

## 2012-10-25 DIAGNOSIS — M948X9 Other specified disorders of cartilage, unspecified sites: Secondary | ICD-10-CM

## 2012-10-25 DIAGNOSIS — E889 Metabolic disorder, unspecified: Secondary | ICD-10-CM

## 2012-10-25 DIAGNOSIS — S82142P Displaced bicondylar fracture of left tibia, subsequent encounter for closed fracture with malunion: Secondary | ICD-10-CM

## 2012-10-25 DIAGNOSIS — I493 Ventricular premature depolarization: Secondary | ICD-10-CM

## 2012-10-25 NOTE — Progress Notes (Signed)
Patient ID: [name], female   DOB: February 08, 1955, 57 y.o.   MRN: [id number]  This is an acute visit.  Level of care skilled.  Facility [facility].  Chief complaint-acute visit status post hospitalization for a closed fracture of the tibial plateau with re.  History of present illness.  Patient is a pleasant 57 year old female who sustained a left tibial plateau fracture after hiking.  She was initially seen at [facility] and then transferred to [facility] for trauma consultation.  She was evaluated preop by cardiology apparently she did have some PVCs but this was stable during her stay.  She underwent an externall fixator secondary to severe depression and malalignment of the left leg--she apparently had too much edema to proceed with definitive fixation.  Plans are to remain in the external fixator for about 10-14 days and then hopefully will be ready for surgical intervention for plate osteosynthesis..  Eventually she was extubated and did spend some time in intensive care.  She improved significantly by the next morning and therapy was begun.  Apparently the rest of her stay was unremarkable she is on Lovenox for DVT and DVT prophylaxis.  Patient lives with her husband but he recently had back surgery and very limited lifting capabilities.  In she is here for rehabilitation.  Labs were done that did show a borderline low vitamin D level as well as a slightly elevated intact PTH-corrected calcium was normal-prealbumin levels were low as well.  Previous medical history.  Closed fracture of tibial plateau-status post repair.  History of GERD.  Obesity.  History PVCs.  History of headache.  Allergic rhinitis.  Glaucoma.  Venous stasis.  Hospital studies.  10/19/2012.  CT of left knee.  Extensively comminuted mildly displaced fracture of the proximal left tibia.  Chest x-ray-no acute left rib fracture identified apparently she had been complaining  of some left-sided chest discomfort.  Medications.  Tylenol 650 mg every 6 hours when necessary.  Vitamin D 1000 units twice a day.  Lovenox 40 mg daily.  Norco 5/325 mg 1-2 tabs every 6 hours as needed.  Robaxin 1-2 tabs 500 mg-1000 mg-every 6 hours as needed.  Multivitamin daily.  Protonix 40 mg daily.  Travatan 0.004% ophthalmologic solution 1 drop both eyes each bedtime  Past surgical history.  History of tubal ligation-cholecystectomy-joint replacement-back surgery-shoulder surgery-vocal cord surgery.  .  Family history.  Father with a history of MI a brother with diabetes.  Social history denies any history of smoking or smokeless tobacco history or alcohol or illicit drug use.  She does live at home with her husband again he has some back issues which limits his ability to care for her in the short term.  Review of systems.  In general denies any fever chills.  Skin-denies issues itching or rash.  Head ears eyes nose mouth and throat-does not complaining of any nasal or eye discharge does not complaining of rhinitis symptoms-or hearing difficulties-denies any visual changes.  Respiratory-no complaints of shortness of breath or cough.  Cardiac-denies any chest pain or palpitations does have a history PVCs does have cardiology followup denies any palpitations however.  GI-denies any nausea vomiting diarrhea or constipation-says her appetite is getting a little better denies any abdominal pain.  GU-no complaints of dysuria.  Muscle skeletal-says her pain is fairly well controlled with regular pain medicine--significant orthopedic issue as noted above.  Neurologic-denies any headache dizziness numbness or syncopal-type feelings.  Psych-does not complaining of depression anxiety-.   Marland Kitchen  Physical exam.  Temperature 98.2 pulse 70 respirations 18 blood pressure 115/75.  In general this is a very pleasant middle-aged female in no distress lying comfortably  in bed.  Her skin is warm and dry.  Eyes pupils equal round and reactive to light sclera and conjunctiva are clear extraocular movements intact visual acuity appears intact.  Oropharynx clear mucous membranes moist.  Chest is clear to auscultation without rhonchi rales or wheezes no labored breathing.  Heart is regular rate and rhythm with frequently irregular beats.  Has minimal lower extremity edema.  Abdomen-is obese soft nontender with positive bowel sounds  GU-grossly within normal limits no drainage or rash noted.  Muscle skeletal-does have fixator in place left leg-capillary refill of the toes is intact she is able to flex and extend her toes.  Otherwise baseline strength and range of motion of her other extremities-- Limited exam of lower extremities  strength secondary to being in bed.  Neurologic appears to be grossly intact no focal weaknesses her speech is clear.  Psych she is alert and oriented x3 a very pleasant individual  .  .  Labs.  10/21/2012.  Sodium 139 potassium 3.9 BUN 11 creatinine 0.86.  Liver function tests within normal limits except albumin of 3.1.  Vitamin D level was 31.  The WBC 10.0 hemoglobin 12.1 platelets 160  The pre-albumin level was low at 12.4  PTHS 77.2 however i PTH was low at 8.0   10/20/2012.  TSH-0.954    . Assessment and plan.  #1-history of left tibial plateau fracture-as noted above she has had external fixator placed they're waiting edema to subside before continuing with further repai--meantime she is to be nonweightbearing-.  She continues on Lovenox for DVT and PE prophylaxis   2-question metabolic bone disease with history of obesity-elevated  i PTH and borderline low vitamin D-she is on supplementation.  Orders to recheck  i PTH in 8 weeks  #3-history PVCs-will need cardiology followup within the next 2 weeks with Dr. Eden Emms  .    Marland Kitchen  #4-history of GERD-she continues on protonix   this appears to  be stable  #5-history of glaucoma  she continues on topical eyedrops  #6-allergic rhinitis-at this point appears to be asymptomatic      Of note will obtain  CBC and BMP next week to ensure stability appearss was stable in hospital--per chart review  Of note greater than 35 minutes spent assessing patient-reviewing the hospital records-and coordinating and formulating a plan of care for numerous diagnoses-of note greater than 50% of time spent coordinating plan of care  .  Marland Kitchen  r.

## 2012-10-27 ENCOUNTER — Encounter (HOSPITAL_COMMUNITY): Payer: Self-pay | Admitting: Orthopedic Surgery

## 2012-10-28 ENCOUNTER — Non-Acute Institutional Stay (SKILLED_NURSING_FACILITY): Payer: 59 | Admitting: Internal Medicine

## 2012-10-28 ENCOUNTER — Ambulatory Visit (INDEPENDENT_AMBULATORY_CARE_PROVIDER_SITE_OTHER): Payer: 59 | Admitting: Cardiovascular Disease

## 2012-10-28 ENCOUNTER — Encounter: Payer: Self-pay | Admitting: Cardiovascular Disease

## 2012-10-28 VITALS — BP 108/64 | HR 60 | Ht 63.0 in | Wt 225.0 lb

## 2012-10-28 DIAGNOSIS — Z01810 Encounter for preprocedural cardiovascular examination: Secondary | ICD-10-CM

## 2012-10-28 DIAGNOSIS — I4949 Other premature depolarization: Secondary | ICD-10-CM

## 2012-10-28 DIAGNOSIS — I493 Ventricular premature depolarization: Secondary | ICD-10-CM

## 2012-10-28 DIAGNOSIS — K219 Gastro-esophageal reflux disease without esophagitis: Secondary | ICD-10-CM

## 2012-10-28 DIAGNOSIS — S82142P Displaced bicondylar fracture of left tibia, subsequent encounter for closed fracture with malunion: Secondary | ICD-10-CM

## 2012-10-28 DIAGNOSIS — IMO0002 Reserved for concepts with insufficient information to code with codable children: Secondary | ICD-10-CM

## 2012-10-28 NOTE — Assessment & Plan Note (Signed)
Benign murmur and ambient PVC;s  Echo ok  Continue beta blocker OK to proceed with second surgery with Dr Carola Frost

## 2012-10-28 NOTE — Assessment & Plan Note (Signed)
Benign continue beta blocker Can do 24 hr holter to quantitate after definitve surgery

## 2012-10-28 NOTE — Progress Notes (Signed)
Patient ID: [name], female   DOB: [birth year], 57 y.o.   MRN: [id number]  Facility; [facility] Chief complaint; admission to SNF post admit to [facility] from September 15 to September 19  History; this is a 57 year old woman who was walking with her husband and tripped and fell with a twisting motion of her left knee. She could not get up and was brought to hospital because of this. She was found to have a left bicondylar fracture of the tibial plateau. She underwent a closed reduction with application of an external fixator. There are plans to do more definitive surgery soon.  She was seen by [Dr. Nishan] for PACs. She also describes a history 2 months ago of being out hiking with her husband developing a retrosternal chest pressure and nausea. She was seen by her primary physician. Apparently an EKG was unremarkable. She is a known benign heart murmur. [C. echocardiogram below.  Past Medical History  Diagnosis Date  . Allergic rhinitis   . Arthritis   . Glaucoma   . GERD (gastroesophageal reflux disease)   . Venous stasis   . Previous metatarsal fractures bilaterally in the [employer] Past Surgical History  Procedure Laterality Date  . Tubal ligation    . Cholecystectomy    . Joint replacement Bilateral     Hips   . Back surgery      cervical fusion   . Shoulder surgery    . Vocal cord surgery    . External fixation leg Left 10/20/2012    Procedure: EXTERNAL FIXATION Left Tibial Plateau Fx;  Surgeon: Budd Palmer, MD;  Location: [facility] OR;  Service: Orthopedics;  Laterality: Left;   Medications; vitamin D 1000 units twice a day, Lovenox 40 mg daily, Robaxin when necessary, hydrocodone/a sedimentation when necessary, Protonix 40 daily, Travatan ophthalmic 0.004% one drop each eye at at bedtime. Lopressor 12.5 bid  Socially; lives in [place] with her husband. Independent. Husband is building a ramp  reports that she has never smoked. She does not have any smokeless  tobacco history on file. She reports that she does not drink alcohol or use illicit drugs.  Family History  Problem Relation Age of Onset  . Heart attack Father   . Diabetes Brother    Review of systems; Respiratory no cough or shortness of breath Cardiac since her episode 2 months ago no exertional chest symptoms no palpitations GI no abdominal pain change in bowel habit GU no voiding difficulties   Physical examination; Vitals; O2 sat 98% on room air, pulse rate 82 respirations 18 Gen.; the patient does not appear to be in any distress Respiratory; clear air entry bilaterally, no wheezing,  Cardiac; S1-S2 normal, no gallops no murmurs occasional irregularity to her pulse Abdomen; no liver no spleen, no tenderness, no masses Extremities; peripheral pulses are palpably normal. Edema in her left leg is much improved  Impression/plan  #1 left bicondylar fracture of the tibial plateau. Her leg seems stable. Swelling by description is much better. Further surgery is planned #2 PVCs she is already followed up with cardiology who advised continue her current dose of Lopressor 12.5 twice a day #3 exertional chest pain and nausea one to 2 months ago while hiking. Her echocardiogram was reasonably benign EKG benign. She has had no further issues. #4 history of gastroesophageal reflux disease on protonix

## 2012-10-28 NOTE — Progress Notes (Signed)
Patient ID: [name], female   DOB: 06-14-1955, 57 y.o.   MRN: [id number] 57 yo recently seen on hospital for preop clearance  Had left tibial plateau fracture while hiking.  Had pins placed and once soft tissue swelling down will have more definitive surgery in a week or two.  Echo normal. Benign ambient PVC;s  Telemetry and intra operative course benign in hospital.  Doing well at [facility] center.  Less knee pain  Swelling improved No angina dyspnea or palpitations.  Compliant with meds  ROS: Denies fever, malais, weight loss, blurry vision, decreased visual acuity, cough, sputum, SOB, hemoptysis, pleuritic pain, palpitaitons, heartburn, abdominal pain, melena, lower extremity edema, claudication, or rash.  All other systems reviewed and negative  General: Affect appropriate Healthy:  appears stated age HEENT: normal Neck supple with no adenopathy JVP normal no bruits no thyromegaly Lungs clear with no wheezing and good diaphragmatic motion Heart:  S1/S2 systolic murmur, no rub, gallop or click PMI normal Abdomen: benighn, BS positve, no tenderness, no AAA no bruit.  No HSM or HJR Distal pulses intact with no bruits No edema Neuro non-focal Skin warm and dry Lef knee pinned and in external imobilizer   No current outpatient prescriptions on file.   No current facility-administered medications for this visit.    Allergies  Prednisone and Amoxicillin  Electrocardiogram:  SR PVC;s nonspecific ST Twave changes   Assessment and Plan

## 2012-10-28 NOTE — Patient Instructions (Addendum)
Your physician recommends that you continue on your current medications as directed. Please refer to the Current Medication list given to you today.  Your physician recommends that you schedule a follow-up appointment in: 8 week with Dr. Eden Emms in [place] if he is not in [place] Schedule with Dr. Purvis Sheffield.

## 2012-11-02 ENCOUNTER — Encounter (HOSPITAL_COMMUNITY): Payer: Self-pay | Admitting: *Deleted

## 2012-11-02 MED ORDER — ACETAMINOPHEN 500 MG PO TABS: 1000.0000 mg | ORAL_TABLET | Freq: Once | ORAL | Status: DC

## 2012-11-02 MED ORDER — VANCOMYCIN HCL 10 G IV SOLR
1500.0000 mg | INTRAVENOUS | Status: AC
  Administered 2012-11-03: 1500 mg via INTRAVENOUS
  Filled 2012-11-02: qty 1500

## 2012-11-02 MED ORDER — CHLORHEXIDINE GLUCONATE 4 % EX LIQD: 60.0000 mL | Freq: Once | CUTANEOUS | Status: DC

## 2012-11-02 NOTE — Progress Notes (Signed)
Pt denies SOB, chest pain, and being under the care of a cardiologist. Pt made aware to stop taking Aspirin and herbal medications. Do not take any NSAIDs ie: Ibuprofen, Advil, Naproxen or any medication containing Aspirin. 

## 2012-11-02 NOTE — H&P (Signed)
[facility] H&P  Please see my detailed consult note from 10/21/2012  No interval changes  Pt has been at the [facility] center and is doing well Has f/u with cards and no acute changes are noted. Continue BB 12.5 mg po bid  Presents for ORIF L tibial plateau and removal of ex fix   Mearl Latin, PA-C [facility] [phone] (P) 11/02/2012 11:18 AM

## 2012-11-03 ENCOUNTER — Inpatient Hospital Stay (HOSPITAL_COMMUNITY): Payer: 59

## 2012-11-03 ENCOUNTER — Encounter (HOSPITAL_COMMUNITY): Payer: Self-pay | Admitting: *Deleted

## 2012-11-03 ENCOUNTER — Inpatient Hospital Stay (HOSPITAL_COMMUNITY): Payer: 59 | Admitting: Anesthesiology

## 2012-11-03 ENCOUNTER — Encounter (HOSPITAL_COMMUNITY)
Admission: RE | Disposition: A | Discharge status: Home Health | Payer: Self-pay | Source: Ambulatory Visit | Attending: Orthopedic Surgery

## 2012-11-03 ENCOUNTER — Encounter (HOSPITAL_COMMUNITY): Payer: Self-pay | Admitting: Anesthesiology

## 2012-11-03 ENCOUNTER — Inpatient Hospital Stay (HOSPITAL_COMMUNITY)
Admission: RE | Admit: 2012-11-03 | Discharge: 2012-11-06 | DRG: 494 | Disposition: A | Discharge status: Home Health | Payer: 59 | Source: Ambulatory Visit | Attending: Orthopedic Surgery | Admitting: Orthopedic Surgery

## 2012-11-03 DIAGNOSIS — S82142D Displaced bicondylar fracture of left tibia, subsequent encounter for closed fracture with routine healing: Secondary | ICD-10-CM

## 2012-11-03 DIAGNOSIS — E669 Obesity, unspecified: Secondary | ICD-10-CM | POA: Diagnosis present

## 2012-11-03 DIAGNOSIS — H409 Unspecified glaucoma: Secondary | ICD-10-CM | POA: Diagnosis present

## 2012-11-03 DIAGNOSIS — K219 Gastro-esophageal reflux disease without esophagitis: Secondary | ICD-10-CM | POA: Diagnosis present

## 2012-11-03 DIAGNOSIS — S82142A Displaced bicondylar fracture of left tibia, initial encounter for closed fracture: Secondary | ICD-10-CM

## 2012-11-03 HISTORY — PX: EXTERNAL FIXATION REMOVAL: SHX5040

## 2012-11-03 HISTORY — PX: ORIF TIBIA PLATEAU: SHX2132

## 2012-11-03 LAB — CBC WITH DIFFERENTIAL/PLATELET
Basophils Relative: 1 % (ref 0–1)
Eosinophils Absolute: 0.2 10*3/uL (ref 0.0–0.7)
Eosinophils Relative: 2 % (ref 0–5)
Hemoglobin: 12.4 g/dL (ref 12.0–15.0)
Lymphocytes Relative: 26 % (ref 12–46)
MCH: 31.2 pg (ref 26.0–34.0)
MCHC: 34.2 g/dL (ref 30.0–36.0)
MCV: 91.4 fL (ref 78.0–100.0)
Monocytes Absolute: 0.5 10*3/uL (ref 0.1–1.0)
Monocytes Relative: 7 % (ref 3–12)
Neutro Abs: 5 10*3/uL (ref 1.7–7.7)
Neutrophils Relative %: 64 % (ref 43–77)
Platelets: 383 10*3/uL (ref 150–400)
WBC: 7.8 10*3/uL (ref 4.0–10.5)

## 2012-11-03 LAB — COMPREHENSIVE METABOLIC PANEL
Albumin: 3.7 g/dL (ref 3.5–5.2)
Alkaline Phosphatase: 70 U/L (ref 39–117)
BUN: 10 mg/dL (ref 6–23)
Calcium: 9.6 mg/dL (ref 8.4–10.5)
Chloride: 99 mEq/L (ref 96–112)
GFR calc Af Amer: >90 mL/min (ref >90)
Glucose, Bld: 110 mg/dL — ABNORMAL HIGH (ref 70–99)
Potassium: 4.5 mEq/L (ref 3.5–5.1)
Total Protein: 7.1 g/dL (ref 6.0–8.3)

## 2012-11-03 LAB — PROTIME-INR
INR: 0.97 (ref 0.00–1.49)
Prothrombin Time: 12.7 seconds (ref 11.6–15.2)

## 2012-11-03 LAB — APTT: aPTT: 33 seconds (ref 24–37)

## 2012-11-03 SURGERY — OPEN REDUCTION INTERNAL FIXATION (ORIF) TIBIAL PLATEAU
Anesthesia: General | Site: Leg Lower | Laterality: Left | Wound class: Clean Contaminated

## 2012-11-03 MED ORDER — SODIUM CHLORIDE 0.9 % IV SOLN
INTRAVENOUS | Status: DC | PRN
  Administered 2012-11-03: 11:00:00 via INTRAVENOUS

## 2012-11-03 MED ORDER — ACETAMINOPHEN 325 MG PO TABS
325.0000 mg | ORAL_TABLET | Freq: Four times a day (QID) | ORAL | Status: DC | PRN
  Administered 2012-11-04: 650 mg via ORAL
  Filled 2012-11-03: qty 2

## 2012-11-03 MED ORDER — POLYETHYLENE GLYCOL 3350 17 G PO PACK: 17.0000 g | PACK | Freq: Every day | ORAL | Status: DC | PRN

## 2012-11-03 MED ORDER — MIDAZOLAM HCL 5 MG/5ML IJ SOLN
INTRAMUSCULAR | Status: DC | PRN
  Administered 2012-11-03: 2 mg via INTRAVENOUS

## 2012-11-03 MED ORDER — LIDOCAINE HCL (CARDIAC) 20 MG/ML IV SOLN
INTRAVENOUS | Status: DC | PRN
  Administered 2012-11-03: 100 mg via INTRAVENOUS

## 2012-11-03 MED ORDER — METOPROLOL TARTRATE 12.5 MG HALF TABLET
12.5000 mg | ORAL_TABLET | Freq: Once | ORAL | Status: AC
  Administered 2012-11-03: 12.5 mg via ORAL
  Filled 2012-11-03: qty 1

## 2012-11-03 MED ORDER — VITAMIN D3 25 MCG (1000 UNIT) PO TABS
1000.0000 [IU] | ORAL_TABLET | Freq: Two times a day (BID) | ORAL | Status: DC
  Administered 2012-11-03 – 2012-11-06 (×6): 1000 [IU] via ORAL
  Filled 2012-11-03 (×9): qty 1

## 2012-11-03 MED ORDER — ENOXAPARIN SODIUM 40 MG/0.4ML ~~LOC~~ SOLN
40.0000 mg | SUBCUTANEOUS | Status: DC
  Administered 2012-11-04 – 2012-11-06 (×3): 40 mg via SUBCUTANEOUS
  Filled 2012-11-03 (×3): qty 0.4

## 2012-11-03 MED ORDER — ONDANSETRON HCL 4 MG PO TABS: 4.0000 mg | ORAL_TABLET | Freq: Four times a day (QID) | ORAL | Status: DC | PRN

## 2012-11-03 MED ORDER — METOCLOPRAMIDE HCL 10 MG PO TABS: 5.0000 mg | ORAL_TABLET | Freq: Three times a day (TID) | ORAL | Status: DC | PRN

## 2012-11-03 MED ORDER — HYDROCODONE-ACETAMINOPHEN 5-325 MG PO TABS
ORAL_TABLET | ORAL | Status: AC
  Filled 2012-11-03: qty 1

## 2012-11-03 MED ORDER — PROPOFOL 10 MG/ML IV BOLUS
INTRAVENOUS | Status: DC | PRN
  Administered 2012-11-03: 160 mg via INTRAVENOUS

## 2012-11-03 MED ORDER — POTASSIUM CHLORIDE IN NACL 20-0.9 MEQ/L-% IV SOLN
INTRAVENOUS | Status: DC
  Administered 2012-11-04 – 2012-11-05 (×4): via INTRAVENOUS
  Filled 2012-11-03 (×8): qty 1000

## 2012-11-03 MED ORDER — METHOCARBAMOL 500 MG PO TABS
500.0000 mg | ORAL_TABLET | Freq: Four times a day (QID) | ORAL | Status: DC | PRN
  Administered 2012-11-03 – 2012-11-06 (×4): 500 mg via ORAL
  Filled 2012-11-03 (×3): qty 1

## 2012-11-03 MED ORDER — ONDANSETRON HCL 4 MG/2ML IJ SOLN: 4.0000 mg | Freq: Four times a day (QID) | INTRAMUSCULAR | Status: DC | PRN

## 2012-11-03 MED ORDER — METOPROLOL TARTRATE 12.5 MG HALF TABLET
12.5000 mg | ORAL_TABLET | Freq: Two times a day (BID) | ORAL | Status: DC
  Administered 2012-11-03 – 2012-11-05 (×4): 12.5 mg via ORAL
  Filled 2012-11-03 (×8): qty 1

## 2012-11-03 MED ORDER — VANCOMYCIN HCL IN DEXTROSE 1-5 GM/200ML-% IV SOLN
1000.0000 mg | Freq: Two times a day (BID) | INTRAVENOUS | Status: AC
  Administered 2012-11-03: 1000 mg via INTRAVENOUS
  Filled 2012-11-03: qty 200

## 2012-11-03 MED ORDER — DIPHENHYDRAMINE HCL 12.5 MG/5ML PO ELIX: 12.5000 mg | ORAL_SOLUTION | ORAL | Status: DC | PRN

## 2012-11-03 MED ORDER — HYDROMORPHONE 0.3 MG/ML IV SOLN
INTRAVENOUS | Status: DC
  Administered 2012-11-03: 14:00:00 via INTRAVENOUS
  Administered 2012-11-03: 0.799 mg via INTRAVENOUS
  Administered 2012-11-03: 0.4 mg via INTRAVENOUS
  Administered 2012-11-04: 1.19 mg via INTRAVENOUS
  Administered 2012-11-04: 22:00:00 via INTRAVENOUS
  Administered 2012-11-04: 1.19 mg via INTRAVENOUS
  Administered 2012-11-04: 05:00:00 via INTRAVENOUS
  Administered 2012-11-04: 1.59 mg via INTRAVENOUS
  Administered 2012-11-05: 1.79 mg via INTRAVENOUS
  Administered 2012-11-05: 1.19 mg via INTRAVENOUS
  Filled 2012-11-03 (×2): qty 25

## 2012-11-03 MED ORDER — NALOXONE HCL 0.4 MG/ML IJ SOLN: 0.4000 mg | INTRAMUSCULAR | Status: DC | PRN

## 2012-11-03 MED ORDER — 0.9 % SODIUM CHLORIDE (POUR BTL) OPTIME
TOPICAL | Status: DC | PRN
  Administered 2012-11-03: 1000 mL

## 2012-11-03 MED ORDER — PANTOPRAZOLE SODIUM 40 MG PO TBEC
40.0000 mg | DELAYED_RELEASE_TABLET | Freq: Every day | ORAL | Status: DC
  Administered 2012-11-04 – 2012-11-06 (×3): 40 mg via ORAL
  Filled 2012-11-03 (×3): qty 1

## 2012-11-03 MED ORDER — DIPHENHYDRAMINE HCL 12.5 MG/5ML PO ELIX: 12.5000 mg | ORAL_SOLUTION | Freq: Four times a day (QID) | ORAL | Status: DC | PRN

## 2012-11-03 MED ORDER — SUFENTANIL CITRATE 50 MCG/ML IV SOLN
INTRAVENOUS | Status: DC | PRN
  Administered 2012-11-03: 10 ug via INTRAVENOUS
  Administered 2012-11-03: 5 ug via INTRAVENOUS
  Administered 2012-11-03: 20 ug via INTRAVENOUS
  Administered 2012-11-03: 5 ug via INTRAVENOUS
  Administered 2012-11-03 (×2): 10 ug via INTRAVENOUS
  Administered 2012-11-03: 5 ug via INTRAVENOUS

## 2012-11-03 MED ORDER — METOCLOPRAMIDE HCL 5 MG/ML IJ SOLN: 5.0000 mg | Freq: Three times a day (TID) | INTRAMUSCULAR | Status: DC | PRN

## 2012-11-03 MED ORDER — HYDROMORPHONE HCL PF 1 MG/ML IJ SOLN
INTRAMUSCULAR | Status: AC
  Filled 2012-11-03: qty 1

## 2012-11-03 MED ORDER — ROCURONIUM BROMIDE 100 MG/10ML IV SOLN
INTRAVENOUS | Status: DC | PRN
  Administered 2012-11-03: 40 mg via INTRAVENOUS

## 2012-11-03 MED ORDER — METHOCARBAMOL 100 MG/ML IJ SOLN
500.0000 mg | Freq: Four times a day (QID) | INTRAVENOUS | Status: DC | PRN
  Filled 2012-11-03: qty 5

## 2012-11-03 MED ORDER — ONDANSETRON HCL 4 MG/2ML IJ SOLN
INTRAMUSCULAR | Status: DC | PRN
  Administered 2012-11-03: 4 mg via INTRAVENOUS

## 2012-11-03 MED ORDER — HYDROMORPHONE 0.3 MG/ML IV SOLN
INTRAVENOUS | Status: AC
  Filled 2012-11-03: qty 25

## 2012-11-03 MED ORDER — LACTATED RINGERS IV SOLN
INTRAVENOUS | Status: DC
  Administered 2012-11-03 (×2): via INTRAVENOUS

## 2012-11-03 MED ORDER — ADULT MULTIVITAMIN W/MINERALS CH
1.0000 | ORAL_TABLET | Freq: Every day | ORAL | Status: DC
  Administered 2012-11-03 – 2012-11-06 (×4): 1 via ORAL
  Filled 2012-11-03 (×5): qty 1

## 2012-11-03 MED ORDER — SODIUM CHLORIDE 0.9 % IJ SOLN: 9.0000 mL | INTRAMUSCULAR | Status: DC | PRN

## 2012-11-03 MED ORDER — METHOCARBAMOL 500 MG PO TABS
ORAL_TABLET | ORAL | Status: AC
  Filled 2012-11-03: qty 1

## 2012-11-03 MED ORDER — DIPHENHYDRAMINE HCL 50 MG/ML IJ SOLN: 12.5000 mg | Freq: Four times a day (QID) | INTRAMUSCULAR | Status: DC | PRN

## 2012-11-03 MED ORDER — BISACODYL 10 MG RE SUPP: 10.0000 mg | Freq: Every day | RECTAL | Status: DC | PRN

## 2012-11-03 MED ORDER — HYDROMORPHONE HCL PF 1 MG/ML IJ SOLN
0.2500 mg | INTRAMUSCULAR | Status: DC | PRN
  Administered 2012-11-03 (×2): 1 mg via INTRAVENOUS

## 2012-11-03 MED ORDER — METOPROLOL TARTRATE 12.5 MG HALF TABLET
ORAL_TABLET | ORAL | Status: AC
  Administered 2012-11-03: 12.5 mg via ORAL
  Filled 2012-11-03: qty 1

## 2012-11-03 MED ORDER — HYDROCODONE-ACETAMINOPHEN 5-325 MG PO TABS
1.0000 | ORAL_TABLET | ORAL | Status: DC | PRN
  Administered 2012-11-03: 1 via ORAL
  Administered 2012-11-05 – 2012-11-06 (×3): 2 via ORAL
  Filled 2012-11-03 (×4): qty 2

## 2012-11-03 MED ORDER — DOCUSATE SODIUM 100 MG PO CAPS
100.0000 mg | ORAL_CAPSULE | Freq: Two times a day (BID) | ORAL | Status: DC
  Administered 2012-11-03 – 2012-11-06 (×6): 100 mg via ORAL
  Filled 2012-11-03 (×8): qty 1

## 2012-11-03 SURGICAL SUPPLY — 94 items
BANDAGE ELASTIC 4 VELCRO ST LF (GAUZE/BANDAGES/DRESSINGS) ×2 IMPLANT
BANDAGE ELASTIC 6 VELCRO ST LF (GAUZE/BANDAGES/DRESSINGS) ×2 IMPLANT
BANDAGE ESMARK 6X9 LF (GAUZE/BANDAGES/DRESSINGS) ×1 IMPLANT
BANDAGE GAUZE ELAST BULKY 4 IN (GAUZE/BANDAGES/DRESSINGS) ×4 IMPLANT
BIT DRILL 100X2.5XANTM LCK (BIT) ×1 IMPLANT
BIT DRILL 3.5X5.5 QC CALB (BIT) ×2 IMPLANT
BIT DRILL CAL (BIT) ×1 IMPLANT
BIT DRL 100X2.5XANTM LCK (BIT) ×1
BLADE SURG 10 STRL SS (BLADE) ×2 IMPLANT
BLADE SURG 15 STRL LF DISP TIS (BLADE) ×1 IMPLANT
BLADE SURG 15 STRL SS (BLADE) ×1
BLADE SURG ROTATE 9660 (MISCELLANEOUS) IMPLANT
BNDG COHESIVE 4X5 TAN STRL (GAUZE/BANDAGES/DRESSINGS) IMPLANT
BNDG ESMARK 6X9 LF (GAUZE/BANDAGES/DRESSINGS) ×2
BONE CANC CHIPS 40CC CAN1/2 (Bone Implant) ×2 IMPLANT
BRUSH SCRUB DISP (MISCELLANEOUS) ×4 IMPLANT
CHIPS CANC BONE 40CC CAN1/2 (Bone Implant) ×1 IMPLANT
CLOTH BEACON ORANGE TIMEOUT ST (SAFETY) ×2 IMPLANT
COVER MAYO STAND STRL (DRAPES) ×2 IMPLANT
DRAPE C-ARM 42X72 X-RAY (DRAPES) ×2 IMPLANT
DRAPE C-ARMOR (DRAPES) ×2 IMPLANT
DRAPE INCISE IOBAN 66X45 STRL (DRAPES) ×2 IMPLANT
DRAPE ORTHO SPLIT 77X108 STRL (DRAPES) ×1
DRAPE SURG ORHT 6 SPLT 77X108 (DRAPES) ×1 IMPLANT
DRAPE U-SHAPE 47X51 STRL (DRAPES) ×2 IMPLANT
DRILL BIT 2.5MM (BIT) ×1
DRILL BIT CAL (BIT) ×2
DRSG ADAPTIC 3X8 NADH LF (GAUZE/BANDAGES/DRESSINGS) ×2 IMPLANT
DRSG PAD ABDOMINAL 8X10 ST (GAUZE/BANDAGES/DRESSINGS) ×4 IMPLANT
ELECT REM PT RETURN 9FT ADLT (ELECTROSURGICAL) ×2
ELECTRODE REM PT RTRN 9FT ADLT (ELECTROSURGICAL) ×1 IMPLANT
EVACUATOR 1/8 PVC DRAIN (DRAIN) IMPLANT
EVACUATOR 3/16  PVC DRAIN (DRAIN)
EVACUATOR 3/16 PVC DRAIN (DRAIN) IMPLANT
GLOVE BIO SURGEON STRL SZ7 (GLOVE) ×2 IMPLANT
GLOVE BIO SURGEON STRL SZ7.5 (GLOVE) ×2 IMPLANT
GLOVE BIO SURGEON STRL SZ8 (GLOVE) ×2 IMPLANT
GLOVE BIOGEL PI IND STRL 7.5 (GLOVE) ×2 IMPLANT
GLOVE BIOGEL PI IND STRL 8 (GLOVE) ×1 IMPLANT
GLOVE BIOGEL PI INDICATOR 7.5 (GLOVE) ×2
GLOVE BIOGEL PI INDICATOR 8 (GLOVE) ×1
GLOVE SURG SS PI 7.5 STRL IVOR (GLOVE) ×4 IMPLANT
GOWN PREVENTION PLUS XLARGE (GOWN DISPOSABLE) ×4 IMPLANT
GOWN STRL NON-REIN LRG LVL3 (GOWN DISPOSABLE) ×2 IMPLANT
GOWN STRL REIN 2XL XLG LVL4 (GOWN DISPOSABLE) ×2 IMPLANT
GOWN STRL REIN XL XLG (GOWN DISPOSABLE) ×2 IMPLANT
IMMOBILIZER KNEE 22 UNIV (SOFTGOODS) ×2 IMPLANT
K-WIRE ACE 1.6X6 (WIRE) ×14
KIT BASIN OR (CUSTOM PROCEDURE TRAY) ×2 IMPLANT
KIT ROOM TURNOVER OR (KITS) ×2 IMPLANT
KWIRE ACE 1.6X6 (WIRE) ×7 IMPLANT
MANIFOLD NEPTUNE II (INSTRUMENTS) ×2 IMPLANT
NDL SUT 6 .5 CRC .975X.05 MAYO (NEEDLE) IMPLANT
NEEDLE 22X1 1/2 (OR ONLY) (NEEDLE) IMPLANT
NEEDLE MAYO TAPER (NEEDLE)
NS IRRIG 1000ML POUR BTL (IV SOLUTION) ×2 IMPLANT
PACK ORTHO EXTREMITY (CUSTOM PROCEDURE TRAY) ×2 IMPLANT
PAD ARMBOARD 7.5X6 YLW CONV (MISCELLANEOUS) ×2 IMPLANT
PAD CAST 4YDX4 CTTN HI CHSV (CAST SUPPLIES) ×1 IMPLANT
PADDING CAST COTTON 4X4 STRL (CAST SUPPLIES) ×1
PADDING CAST COTTON 6X4 STRL (CAST SUPPLIES) ×2 IMPLANT
PLATE LOCK 7H STD LT PROX TIB (Plate) ×2 IMPLANT
SCREW CORTICAL 3.5MM  30MM (Screw) ×2 IMPLANT
SCREW CORTICAL 3.5MM  34MM (Screw) ×1 IMPLANT
SCREW CORTICAL 3.5MM 30MM (Screw) ×2 IMPLANT
SCREW CORTICAL 3.5MM 34MM (Screw) ×1 IMPLANT
SCREW CORTICAL 3.5MM 40MM (Screw) ×4 IMPLANT
SCREW LOCK 3.5X65 DIST TIB (Screw) ×4 IMPLANT
SCREW LOCK 3.5X70 DIST TIB (Screw) ×2 IMPLANT
SCREW LOCK 3.5X75 DIST TIB (Screw) ×2 IMPLANT
SCREW LOCK CORT STAR 3.5X60 (Screw) ×2 IMPLANT
SCREW LOCK CORT STAR 3.5X65 (Screw) ×2 IMPLANT
SCREW LOCK CORT STAR 3.5X70 (Screw) ×4 IMPLANT
SCREW LOCK CORT STAR 3.5X75 (Screw) ×2 IMPLANT
SPONGE GAUZE 4X4 12PLY (GAUZE/BANDAGES/DRESSINGS) ×2 IMPLANT
SPONGE LAP 18X18 X RAY DECT (DISPOSABLE) ×2 IMPLANT
STAPLER VISISTAT 35W (STAPLE) ×2 IMPLANT
STOCKINETTE IMPERVIOUS LG (DRAPES) ×2 IMPLANT
SUCTION FRAZIER TIP 10 FR DISP (SUCTIONS) ×2 IMPLANT
SUT ETHILON 3 0 PS 1 (SUTURE) ×6 IMPLANT
SUT PROLENE 0 CT 2 (SUTURE) ×4 IMPLANT
SUT VIC AB 0 CT1 27 (SUTURE) ×1
SUT VIC AB 0 CT1 27XBRD ANBCTR (SUTURE) ×1 IMPLANT
SUT VIC AB 1 CT1 27 (SUTURE) ×1
SUT VIC AB 1 CT1 27XBRD ANBCTR (SUTURE) ×1 IMPLANT
SUT VIC AB 2-0 CT1 27 (SUTURE) ×3
SUT VIC AB 2-0 CT1 TAPERPNT 27 (SUTURE) ×3 IMPLANT
SYR 20ML ECCENTRIC (SYRINGE) IMPLANT
TOWEL OR 17X24 6PK STRL BLUE (TOWEL DISPOSABLE) ×2 IMPLANT
TOWEL OR 17X26 10 PK STRL BLUE (TOWEL DISPOSABLE) ×4 IMPLANT
TRAY FOLEY CATH 16FRSI W/METER (SET/KITS/TRAYS/PACK) IMPLANT
TUBE CONNECTING 12X1/4 (SUCTIONS) ×2 IMPLANT
WATER STERILE IRR 1000ML POUR (IV SOLUTION) ×4 IMPLANT
YANKAUER SUCT BULB TIP NO VENT (SUCTIONS) ×2 IMPLANT

## 2012-11-03 NOTE — Progress Notes (Signed)
Report given to Pal Shell rn as caregiver 

## 2012-11-03 NOTE — OR Nursing (Signed)
Signed out by dr Jacklynn Bue

## 2012-11-03 NOTE — H&P (Signed)
Interval H&P as noted above by Mr. Renae Fickle.  I have discussed with the patient and her husband the risks and benefits of surgery, including the possibility of infection, nerve injury, vessel injury, wound breakdown, arthritis, symptomatic hardware, DVT/ PE, loss of motion, malunion, nonunion, and need for further surgery among others.  She understands these risks and wishes to proceed.  Myrene Galas, MD [facility] [phone] (p)

## 2012-11-03 NOTE — Anesthesia Postprocedure Evaluation (Signed)
  Anesthesia Post-op Note  Patient: [name]  Procedure(s) Performed: Procedure(s): OPEN REDUCTION INTERNAL FIXATION (ORIF) TIBIAL PLATEAU FRACTURE AND REMOVAL OF EXTERNAL FIXATION  (Left) REMOVAL EXTERNAL FIXATION LEG (Left)  Patient Location: PACU  Anesthesia Type:General  Level of Consciousness: awake and sedated  Airway and Oxygen Therapy: Patient Spontanous Breathing  Post-op Pain: mild  Post-op Assessment: Post-op Vital signs reviewed  Post-op Vital Signs: stable  Complications: No apparent anesthesia complications

## 2012-11-03 NOTE — Anesthesia Preprocedure Evaluation (Signed)
Anesthesia Evaluation  Patient identified by MRN, date of birth, ID band Patient awake    Reviewed: Allergy & Precautions, H&P , NPO status , Patient's Chart, lab work & pertinent test results  Airway Mallampati: II      Dental   Pulmonary shortness of breath,  breath sounds clear to auscultation        Cardiovascular + dysrhythmias Rhythm:Regular Rate:Normal     Neuro/Psych    GI/Hepatic Neg liver ROS, GERD-  ,  Endo/Other    Renal/GU negative Renal ROS     Musculoskeletal   Abdominal   Peds  Hematology negative hematology ROS (+)   Anesthesia Other Findings   Reproductive/Obstetrics                           Anesthesia Physical Anesthesia Plan  ASA: II  Anesthesia Plan: General   Post-op Pain Management:    Induction: Intravenous  Airway Management Planned: Oral ETT  Additional Equipment:   Intra-op Plan:   Post-operative Plan: Extubation in OR  Informed Consent:   Plan Discussed with: CRNA, Anesthesiologist and Surgeon  Anesthesia Plan Comments:         Anesthesia Quick Evaluation

## 2012-11-03 NOTE — Anesthesia Procedure Notes (Addendum)
Performed by: Melina Schools   Procedure Name: Intubation Date/Time: 11/03/2012 10:27 AM Performed by: Melina Schools Pre-anesthesia Checklist: Patient identified, Emergency Drugs available, Suction available and Patient being monitored Patient Re-evaluated:Patient Re-evaluated prior to inductionOxygen Delivery Method: Circle system utilized Preoxygenation: Pre-oxygenation with 100% oxygen Intubation Type: IV induction and Cricoid Pressure applied Ventilation: Mask ventilation without difficulty Laryngoscope Size: Mac and 3 Grade View: Grade I Tube type: Oral Tube size: 7.5 mm Number of attempts: 1 Airway Equipment and Method: Stylet Placement Confirmation: ETT inserted through vocal cords under direct vision,  positive ETCO2,  CO2 detector and breath sounds checked- equal and bilateral Secured at: 23 cm Tube secured with: Tape Dental Injury: Teeth and Oropharynx as per pre-operative assessment

## 2012-11-03 NOTE — Brief Op Note (Signed)
11/03/2012  1:16 PM  PATIENT:  [name]  57 y.o. female  PRE-OPERATIVE DIAGNOSIS:  LEFT TIBIA-PLATEAU FRACTURE, bicondylar  POST-OPERATIVE DIAGNOSIS:  LEFT TIBIA-PLATEAU FRACTURE, bicondylar   PROCEDURE:  Procedure(s): 1. OPEN REDUCTION INTERNAL FIXATION (ORIF) TIBIAL , bicondylar PLATEAU FRACTURE 2. REMOVAL OF EXTERNAL FIXATION  (Left) 3. Debridement of ulcerated pin sites 4. Anterior compartment fasciotomy  SURGEON:  Surgeon(s) and Role:    * Budd Palmer, MD - Primary  PHYSICIAN ASSISTANT: Montez Morita, [facility]  ANESTHESIA:   general  EBL:  Total I/O In: 1500 [I.V.:1500] Out: -   BLOOD ADMINISTERED:none  DRAINS: none   LOCAL MEDICATIONS USED:  NONE  SPECIMEN:  No Specimen  DISPOSITION OF SPECIMEN:  N/A  COUNTS:  YES  TOURNIQUET:  104 min  DICTATION: .Other Dictation: Dictation Number [id number]  PLAN OF CARE: Admit to inpatient   PATIENT DISPOSITION:  PACU - hemodynamically stable.   Delay start of Pharmacological VTE agent (>24hrs) due to surgical blood loss or risk of bleeding: yes

## 2012-11-03 NOTE — Transfer of Care (Signed)
Immediate Anesthesia Transfer of Care Note  Patient: [name]  Procedure(s) Performed: Procedure(s): OPEN REDUCTION INTERNAL FIXATION (ORIF) TIBIAL PLATEAU FRACTURE AND REMOVAL OF EXTERNAL FIXATION  (Left) REMOVAL EXTERNAL FIXATION LEG (Left)  Patient Location: PACU  Anesthesia Type:General  Level of Consciousness: awake and oriented  Airway & Oxygen Therapy: Patient Spontanous Breathing and Patient connected to nasal cannula oxygen  Post-op Assessment: Report given to PACU RN, Post -op Vital signs reviewed and stable and Patient moving all extremities  Post vital signs: Reviewed and stable  Complications: No apparent anesthesia complications

## 2012-11-04 LAB — BASIC METABOLIC PANEL
CO2: 29 mEq/L (ref 19–32)
Calcium: 8.8 mg/dL (ref 8.4–10.5)
Chloride: 100 mEq/L (ref 96–112)
Creatinine, Ser: 0.67 mg/dL (ref 0.50–1.10)
GFR calc Af Amer: >90 mL/min (ref >90)
GFR calc non Af Amer: >90 mL/min (ref >90)
Sodium: 136 mEq/L (ref 135–145)

## 2012-11-04 LAB — CBC
HCT: 33.4 % — ABNORMAL LOW (ref 36.0–46.0)
MCHC: 33.8 g/dL (ref 30.0–36.0)
Platelets: 394 10*3/uL (ref 150–400)
RDW: 12.8 % (ref 11.5–15.5)
WBC: 10.3 10*3/uL (ref 4.0–10.5)

## 2012-11-04 NOTE — Op Note (Signed)
NAMESCOTLYN, MCCRANIE NO.:  [id number]  MEDICAL RECORD NO.:  [id number]  LOCATION:  5N29C                        FACILITY:  [facility]  PHYSICIAN:  Doralee Albino. Carola Frost, M.D. DATE OF BIRTH:  1955-02-25  DATE OF PROCEDURE:  11/03/2012 DATE OF DISCHARGE:                              OPERATIVE REPORT   PREOPERATIVE DIAGNOSES: 1. Left bicondylar tibial plateau fracture. 2. Retained external fixator. 3. Ulcerated pin sites.  POSTOPERATIVE DIAGNOSES: 1. Left bicondylar tibial plateau fracture. 2. Retained external fixator. 3. Ulcerated pin sites.  PROCEDURES: 1. Open reduction and internal fixation of bicondylar left tibial     plateau. 2. Removal of external fixator under anesthesia. 3. Anterior compartment fasciotomy. 4. Debridement of ulcerated fixator pin sites.  SURGEON:  Doralee Albino. Carola Frost, M.D.  ASSISTANT:  Mearl Latin, PA-C  ANESTHESIA:  General.  COMPLICATIONS:  None.  TOTAL TOURNIQUET TIME:  104 minutes.  DISPOSITION:  To PACU.  CONDITION:  Stable.  BRIEF SUMMARY AND INDICATION FOR PROCEDURE:  [name] is a 57- year-old female who sustained a severely comminuted tibial plateau fracture, which did require spanning external fixation until her soft tissue swelling had resolved sufficiently to safely allow for reduction and fixation.  I discussed with her preoperatively the risks and benefits of the procedure including the possibility of infection, nerve injury, vessel injury, DVT, PE, heart attack, stroke, arthritis, loss of motion, and many others.  The patient understood these risks, including the possibility of failure to prevent pin tract infection, and did wish to proceed.  BRIEF DESCRIPTION OF PROCEDURE:  [name] was given preoperative vancomycin, taken to the operating room where general anesthesia was induced.  I then removed the external fixator and performed a thorough chlorhexidine scrub of the pin sites removing necrotic  debris around the opening.  Standard prep and drape was then performed with Betadine scrub and paint.  A series of curettes was used to debride the surface of the ulceration, as well as go deep into the pin tract and ultimately into the bone again tracing out curette so as not to bring superficial contamination deep.  These were irrigated thoroughly and then a new drape applied and the pin sites prepped out of the field.  A standard anterolateral approach was then made to the proximal tibia.  We went to the anterior compartment proximally and I exposed the fracture site to the metaphysis.  I kept the periosteum attached along the brim.  I made a separate arthrotomy proximally to the retinaculum.  The coronary ligament was incised along its insertion onto the tibia and reflected with the help of a 4-0 Prolene sutures.  These were later used to repair the coronary ligament back with vertical mattress ties.  The articular surface was comminuted.  However, the lateral meniscus was completely intact and appeared to have been well preserved.  Much of the impacted segments were an elevated sequentially up to the articular surface and additional cancellous bone brought up underneath these to restore a subchondral layer.  This left a very large metaphyseal defect which was grafted with 40 mL of cancellous chips.  Multiple K-wires were placed provisionally across at the articular surface, followed by reduction of the  metaphyseal component and then placement of the plate using the [facility] clamp for compression across the articular surface.  This followed by placement of screws distally to oppose the metaphyseal fragments and ultimately a provisional fixation was then exchanged for a variety of standard and locked screws.  Final images showed appropriate reduction, hardware trajectory, and length with restoration of the articular surface.  Next, attention was turned distally where a long scissors  were used to spread both superficial and deep to the anterior compartment fascia.  After isolating the membrane, the anterior compartment was then released for an additional nearly 10 cm below the incision.  This was to reduce the potential for compartment syndrome.  Montez Morita, PA-C did assist me throughout the procedure including with a placement of provisional and some definitive fixation.  It appears to be noted that also need to repair the tibial tubercle which was found to be a free piece and this was secured with a anterior-posterior lag screw and also seemed to have some fixation from the subchondral bone.  A 0 Vicryl, a 2- 0 Vicryl, and a suture was used for the deep and deep shallow subcu, and skin respectively.  A sterile gently compressive dressing was applied. The knee was stable to varus and valgus forces and extension.  The patient was awakened from anesthesia and transported to the PACU in stable condition.  PROGNOSIS:  [name] will be nonweightbearing with unrestricted range of motion of the knee and ankle on the left.  I do not anticipate the need for her to wear a knee brace, but this may be at some assistance until the patient is sufficiently mobile and steady on one leg.  She will be on DVT prophylaxis with Lovenox and we anticipate a cutaneous for 10 days after discharge.  She is at increased risk for infection because of the need for the fixator, but felt that all the pin sites were clean quite effectively today, and I am hopeful this will mitigate against any long-term infection risk.     Doralee Albino. Carola Frost, M.D.     [facility]  D:  11/03/2012  T:  11/04/2012  Job:  [id number]

## 2012-11-04 NOTE — Progress Notes (Signed)
Orthopedic Tech Progress Note Patient Details:  [name] 02/05/1955 [id number]  Patient ID: [name], female   DOB: [birth year], 57 y.o.   MRN: [id number]   [name] 11/04/2012, 9:11 AMCalled advanced for left hinged knee brace.

## 2012-11-04 NOTE — Evaluation (Signed)
Occupational Therapy Evaluation Patient Details Name: [name] MRN: [id number] DOB: [birth year] Today's Date: 11/04/2012 Time: 8413-2440 OT Time Calculation (min): 22 min  OT Assessment / Plan / Recommendation History of present illness Pt tripped and fell after hiking, sustaining left tibial plateau fx and underwent external fixation. SHe has a history of bilateral posterior approach hip replacements (both more than a yr ago) as well as right shoulder surgery for OA. Pt had external fixator removed now. has been at [facility] for rehab since last admission to incr strength and mobility. pt plans to D/C home.     Clinical Impression   This 57 yo female admitted with above presents to acute OT with problems below. Will benefit from acute OT without need for follow up.    OT Assessment  Patient needs continued OT Services    Follow Up Recommendations  No OT follow up       Equipment Recommendations  None recommended by OT       Frequency  Min 2X/week    Precautions / Restrictions Precautions Precautions: Fall Required Braces or Orthoses: Other Brace/Splint Other Brace/Splint: hinged brace  Restrictions Weight Bearing Restrictions: Yes LLE Weight Bearing: Non weight bearing Other Position/Activity Restrictions: no resistive extension; gentle AROM is allowed        ADL  Eating/Feeding: Independent Where Assessed - Eating/Feeding: Chair Grooming: Set up Where Assessed - Grooming: Unsupported sitting Upper Body Bathing: Set up Where Assessed - Upper Body Bathing: Unsupported sitting Lower Body Bathing: Moderate assistance Where Assessed - Lower Body Bathing: Supported sit to stand Upper Body Dressing: Set up Where Assessed - Upper Body Dressing: Unsupported sitting Lower Body Dressing: Maximal assistance Where Assessed - Lower Body Dressing: Supported sit to stand Toilet Transfer: Minimal assistance Toilet Transfer Method: Sit to Writer:  (Bed> 5 steps forward and back to recliner) Toileting - Clothing Manipulation and Hygiene: Minimal assistance Where Assessed - Toileting Clothing Manipulation and Hygiene: Standing Transfers/Ambulation Related to ADLs: Min A for all with RW    OT Diagnosis: Generalized weakness;Acute pain  OT Problem List: Decreased strength;Impaired balance (sitting and/or standing);Decreased knowledge of use of DME or AE OT Treatment Interventions: Self-care/ADL training;Balance training;DME and/or AE instruction;Patient/family education   OT Goals(Current goals can be found in the care plan section) Acute Rehab OT Goals Patient Stated Goal: to go home  OT Goal Formulation: With patient Time For Goal Achievement: 11/11/12 Potential to Achieve Goals: Good  Visit Information  Last OT Received On: 11/04/12 Assistance Needed: +1 PT/OT Co-Evaluation/Treatment: Yes History of Present Illness: Pt tripped and fell after hiking, sustaining left tibial plateau fx and underwent external fixation. SHe has a history of bilateral posterior approach hip replacements (both more than a yr ago) as well as right shoulder surgery for OA. Pt had external fixator removed now. has been at [facility] for rehab since last admission to incr strength and mobility. pt plans to D/C home.         Prior Functioning     Home Living Family/patient expects to be discharged to:: Private residence Living Arrangements: Spouse/significant other Available Help at Discharge: Family;Available 24 hours/day;Other (Comment) (sister wil stay with her while husband is at work) Type of Home: House Home Access: Ramped entrance Home Layout: One level Home Equipment: Environmental consultant - 2 wheels;Tub bench;Bedside commode;Wheelchair - manual (reacher, sock aid, long handled sponge) Additional Comments: Husband has built ramp; sister will be home with pt to support her while husband works  Prior Function Level of Independence:  Independent 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 Dominant Hand: Right         Vision/Perception Vision - History Patient Visual Report: No change from baseline   Cognition  Cognition Arousal/Alertness: Awake/alert Behavior During Therapy: WFL for tasks assessed/performed Overall Cognitive Status: Within Functional Limits for tasks assessed    Extremity/Trunk Assessment Upper Extremity Assessment Upper Extremity Assessment: Overall WFL for tasks assessed Lower Extremity Assessment Lower Extremity Assessment: LLE deficits/detail LLE Deficits / Details: can wiggle toes; has minimal ability to perform SLR  LLE: Unable to fully assess due to pain;Unable to fully assess due to immobilization LLE Sensation:  (WFL to light touch on toes) Cervical / Trunk Assessment Cervical / Trunk Assessment: Normal     Mobility Bed Mobility Bed Mobility: Supine to Sit;Sitting - Scoot to Edge of Bed Supine to Sit: 3: Mod assist;HOB elevated;With rails Sitting - Scoot to Edge of Bed: 4: Min guard Details for Bed Mobility Assistance: mod (A) to bring Lt LE and trunk to upright sitting position on EOB; VC's for sequencing and hand placement  Transfers Sit to Stand: 4: Min assist;From bed;From elevated surface Stand to Sit: 4: Min assist;With armrests;To chair/3-in-1 Details for Transfer Assistance: VC's for hand placement and sequencing; pt requires (A) to maintain balance; demo good ability to maintain NWB status      Exercise General Exercises - Lower Extremity Ankle Circles/Pumps: AROM;Both;10 reps   Balance Balance Balance Assessed: Yes Static Standing Balance Static Standing - Balance Support: Bilateral upper extremity supported;During functional activity Static Standing - Level of Assistance: 4: Min assist   End of Session OT - End of Session Equipment Utilized During Treatment: Gait belt;Rolling walker Activity Tolerance: Patient tolerated treatment  well Patient left: in chair;with call bell/phone within reach    Evette Georges  [phone] 11/04/2012, 10:50 AM

## 2012-11-04 NOTE — Evaluation (Signed)
Physical Therapy Evaluation Patient Details Name: [name] MRN: [id number] DOB: 07-11-1955 Today's Date: 11/04/2012 Time: 8295-6213 PT Time Calculation (min): 21 min  PT Assessment / Plan / Recommendation History of Present Illness  Pt tripped and fell after hiking, sustaining left tibial plateau fx and underwent external fixation. SHe has a history of bilateral posterior approach hip replacements (both more than a yr ago) as well as right shoulder surgery for OA. Pt had external fixator removed now. has been at [facility] for re[facility] since last admission to incr strength and mobility. pt plans to D/C home.    Clinical Impression  Patient is s/p removal of extern fixator and ORIF Lt tibial plateau surgery resulting in functional limitations due to the deficits listed below (see PT Problem List). Patient will benefit from skilled PT to increase their independence and safety with mobility to allow discharge to the venue listed below. Pt has been NWB and at [facility] for rehab since last admission. Pt will have 24/7 (A) by family upon acute D/C. Anticipate good progress.      PT Assessment  Patient needs continued PT services    Follow Up Recommendations  Home health PT;Supervision/Assistance - 24 hour    Does the patient have the potential to tolerate intense rehabilitation      Barriers to Discharge   none at this time     Equipment Recommendations  None recommended by PT    Recommendations for Other Services     Frequency Min 5X/week    Precautions / Restrictions Precautions Precautions: Fall Required Braces or Orthoses: Other Brace/Splint Other Brace/Splint: hinged brace  Restrictions Weight Bearing Restrictions: Yes LLE Weight Bearing: Non weight bearing Other Position/Activity Restrictions: no resistive extension; gentle AROM is allowed    Pertinent Vitals/Pain 4/10; patient repositioned for comfort in chair       Mobility  Bed Mobility Bed Mobility:  Supine to Sit;Sitting - Scoot to Edge of Bed Supine to Sit: 3: Mod assist;HOB elevated;With rails Sitting - Scoot to Edge of Bed: 4: Min guard Details for Bed Mobility Assistance: mod (A) to bring Lt LE and trunk to upright sitting position on EOB; VC's for sequencing and hand placement  Transfers Transfers: Sit to Stand;Stand to Sit Sit to Stand: 4: Min assist;From bed;From elevated surface Stand to Sit: 4: Min assist;With armrests;To chair/3-in-1 Details for Transfer Assistance: VC's for hand placement and sequencing; pt requires (A) to maintain balance; demo good ability to maintain NWB status  Ambulation/Gait Ambulation/Gait Assistance: 4: Min assist Ambulation Distance (Feet): 12 Feet Assistive device: Rolling walker Ambulation/Gait Assistance Details: VC's for gt sequencing and safety with RW; min (A) to manage RW and maintain balance; pt maintains NWB status independently well  Gait Pattern:  (hop to ) Gait velocity: decreased Stairs: No Wheelchair Mobility Wheelchair Mobility: No    Exercises General Exercises - Lower Extremity Ankle Circles/Pumps: AROM;Both;10 reps   PT Diagnosis: Difficulty walking;Acute pain  PT Problem List: Decreased strength;Decreased range of motion;Decreased balance;Decreased mobility;Pain PT Treatment Interventions: DME instruction;Gait training;Functional mobility training;Therapeutic activities;Therapeutic exercise;Balance training;Neuromuscular re-education;Patient/family education     PT Goals(Current goals can be found in the care plan section) Acute Rehab PT Goals Patient Stated Goal: to go home  PT Goal Formulation: With patient Time For Goal Achievement: 11/11/12 Potential to Achieve Goals: Good  Visit Information  Last PT Received On: 11/04/12 Assistance Needed: +1 PT/OT Co-Evaluation/Treatment: Yes History of Present Illness: Pt tripped and fell after hiking, sustaining left tibial plateau fx and  underwent external fixation. SHe has  a history of bilateral posterior approach hip replacements (both more than a yr ago) as well as right shoulder surgery for OA. Pt had external fixator removed now. has been at [facility] for [facility] since last admission to incr strength and mobility. pt plans to D/C home.         Prior Functioning  Home Living Family/patient expects to be discharged to:: Private residence Living Arrangements: Spouse/significant other Available Help at Discharge: Family;Available 24 hours/day;Other (Comment) (sister wil stay with her while husband is at work) Type of Home: House Home Access: Ramped entrance Home Layout: One level Home Equipment: Environmental consultant - 2 wheels;Tub bench;Bedside commode;Wheelchair - manual Additional Comments: Husband has built ramp; sister will be home with pt to support her while husband works  Prior Function Level of Independence: Independent 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 Dominant Hand: Right    Cognition  Cognition Arousal/Alertness: Awake/alert Behavior During Therapy: WFL for tasks assessed/performed Overall Cognitive Status: Within Functional Limits for tasks assessed    Extremity/Trunk Assessment Upper Extremity Assessment Upper Extremity Assessment: Defer to OT evaluation Lower Extremity Assessment Lower Extremity Assessment: LLE deficits/detail LLE Deficits / Details: can wiggle toes; has minimal ability to perform SLR  LLE: Unable to fully assess due to pain;Unable to fully assess due to immobilization LLE Sensation:  (WFL to light touch on toes) Cervical / Trunk Assessment Cervical / Trunk Assessment: Normal   Balance Balance Balance Assessed: Yes Static Standing Balance Static Standing - Balance Support: Bilateral upper extremity supported;During functional activity Static Standing - Level of Assistance: 4: Min assist  End of Session PT - End of Session Equipment Utilized During Treatment: Gait belt Activity Tolerance:  Patient tolerated treatment well Patient left: in chair;with call bell/phone within reach Nurse Communication: Mobility status  GP     Donell Sievert, [place] [phone] 11/04/2012, 10:47 AM

## 2012-11-04 NOTE — Progress Notes (Signed)
Orthopedic Tech Progress Note Patient Details:  [name] [birth year] [id number]  Patient ID: [name], female   DOB: 09/28/1955, 57 y.o.   MRN: [id number] Trapeze bar patient helper  Nikki Dom 11/04/2012, 2:03 PM

## 2012-11-04 NOTE — Progress Notes (Signed)
Orthopaedic Trauma Service Progress Note  Subjective  Doing well this am Still needing PCA but pain controlled Not too hungry  Foley has been removed  Wants to go home at d/c   Review of Systems  Constitutional: Negative for fever and chills.  Eyes: Negative for blurred vision.  Respiratory: Negative for cough and shortness of breath.   Cardiovascular: Negative for chest pain and palpitations.  Gastrointestinal: Negative for nausea, vomiting and abdominal pain.  Genitourinary: Negative for dysuria.  Neurological: Negative for tingling, sensory change and headaches.     Objective   BP 115/62  Pulse 79  Temp(Src) 98.6 F (37 C) (Oral)  Resp 14  Ht 5\' 3"  (1.6 m)  Wt 99.791 kg (220 lb)  BMI 38.98 kg/m2  SpO2 94%  Intake/Output     09/30 0701 - 10/01 0700 10/01 0701 - 10/02 0700   P.O. 480    I.V. (mL/kg) 3450 (34.6)    Total Intake(mL/kg) 3930 (39.4)    Urine (mL/kg/hr) 2325    Total Output 2325     Net +1605            Labs  Results for COLLYN, SELK (MRN [id number]) as of 11/04/2012 08:29  Ref. Range 11/04/2012 07:00  Sodium Latest Range: 135-145 mEq/L 136  Potassium Latest Range: 3.5-5.1 mEq/L 3.9  Chloride Latest Range: 96-112 mEq/L 100  CO2 Latest Range: 19-32 mEq/L 29  BUN Latest Range: 6-23 mg/dL 7  Creatinine Latest Range: 0.50-1.10 mg/dL 4.09  Calcium Latest Range: 8.4-10.5 mg/dL 8.8  GFR calc non Af Amer Latest Range: >90 mL/min >90  GFR calc Af Amer Latest Range: >90 mL/min >90  Glucose Latest Range: 70-99 mg/dL 811 (H)  WBC Latest Range: 4.0-10.5 K/uL 10.3  RBC Latest Range: 3.87-5.11 MIL/uL 3.63 (L)  Hemoglobin Latest Range: 12.0-15.0 g/dL 91.4 (L)  HCT Latest Range: 36.0-46.0 % 33.4 (L)  MCV Latest Range: 78.0-100.0 fL 92.0  MCH Latest Range: 26.0-34.0 pg 31.1  MCHC Latest Range: 30.0-36.0 g/dL 78.2  RDW Latest Range: 11.5-15.5 % 12.8  Platelets Latest Range: 150-400 K/uL 394    Exam  Gen: lying comfortably in bed, doing well,  NAD Lungs: unlabored Cardiac: Reg Abd: + BS, NTND Ext:       Left Lower Extremity   Dressing c/d/i  Ext warm  + DP pulse  EHL, FHL, AT, PT, peroneals, gastroc motor intact  Compartments soft and NT  No pain with passive stretch   Swelling stable   Assessment and Plan   POD/HD#: 1   57 y/o female s/p fall with comminuted L tibial plateau fracture, POD1  1. L tibial plateau fracture, POD1  NWB x 6-8 weeks  Unrestricted ROM, gentle ROM    Pt did have repair of tibial tubercle but it is stable for AROM, absolutely NO EXTENSION AGAINST RESISTANCE  Ice and elevate  PT/OT evals  Dressing change tomorrow  Hinged knee brace   2. Hx of PVC  Continue BB  3. GERD  protonix  4. Pain  PCA for an additional day, then d/c and transition to PO's    5. DVT/PE prophylaxis  Lovenox x 21 days  scd's  6. Obesity  7. ID  Completed periop abx  8. Activity:  OOB as tolerated   NWB L leg  9. Dispo:  PT/OT consults  Probable dc home on Friday    Mearl Latin, PA-C [facility] [phone] (P) 11/04/2012 8:28 AM

## 2012-11-05 LAB — BASIC METABOLIC PANEL
Chloride: 103 mEq/L (ref 96–112)
Creatinine, Ser: 0.67 mg/dL (ref 0.50–1.10)
GFR calc Af Amer: >90 mL/min (ref >90)
GFR calc non Af Amer: >90 mL/min (ref >90)
Potassium: 4.5 mEq/L (ref 3.5–5.1)
Sodium: 139 mEq/L (ref 135–145)

## 2012-11-05 LAB — CBC
HCT: 31.5 % — ABNORMAL LOW (ref 36.0–46.0)
Hemoglobin: 10.3 g/dL — ABNORMAL LOW (ref 12.0–15.0)
RBC: 3.36 MIL/uL — ABNORMAL LOW (ref 3.87–5.11)
WBC: 7.4 10*3/uL (ref 4.0–10.5)

## 2012-11-05 MED ORDER — OXYCODONE HCL 5 MG PO TABS: 5.0000 mg | ORAL_TABLET | ORAL | Status: DC | PRN

## 2012-11-05 NOTE — Progress Notes (Signed)
Occupational Therapy Treatment Patient Details Name: [name] MRN: [id number] DOB: January 10, 1956 Today's Date: 11/05/2012 Time: 4098-1191 OT Time Calculation (min): 24 min  OT Assessment / Plan / Recommendation  History of present illness Pt tripped and fell after hiking, sustaining left tibial plateau fx and underwent external fixation. SHe has a history of bilateral posterior approach hip replacements (both more than a yr ago) as well as right shoulder surgery for OA. Pt had external fixator removed now. has been at [facility] for rehab since [facility]. pt plans to D/C home.     OT comments  Pt making progress although still limited by pain and fatigue.  Follow Up Recommendations  No OT follow up       Equipment Recommendations  None recommended by OT       Frequency Min 2X/week   Progress towards OT Goals Progress towards OT goals: Progressing toward goals  Plan Discharge plan remains appropriate    Precautions / Restrictions Precautions Precautions: Fall Required Braces or Orthoses: Other Brace/Splint Other Brace/Splint: hinged brace  Restrictions LLE Weight Bearing: Non weight bearing Other Position/Activity Restrictions: no resistive extension; gentle AROM is allowed    Pertinent Vitals/Pain 4/10 at rest; 7/10 with activity; 4/10 at rest at end---LLE    ADL  Toilet Transfer: Min guard Toilet Transfer Method: Sit to Barista: Raised toilet seat with arms (or 3-in-1 over toilet) Toileting - Clothing Manipulation and Hygiene: Min guard Where Assessed - Engineer, mining and Hygiene: Standing Equipment Used: Rolling walker Transfers/Ambulation Related to ADLs: Min guard A ADL Comments: Did not get to try AE due to increased pain and fatigue with getting to bathroom from bed and back to recliner      OT Goals(current goals can now be found in the care plan section)    Visit Information  Last OT Received On: 11/05/12 Assistance Needed: +1 History of Present Illness: Pt tripped and fell after hiking, sustaining left tibial plateau fx and underwent external fixation. SHe has a history of bilateral posterior approach hip replacements (both more than a yr ago) as well as right shoulder surgery for OA. Pt had external fixator removed now. has been at [facility] for rehab since [facility]. pt plans to D/C home.            Cognition  Cognition Arousal/Alertness: Awake/alert Behavior During Therapy: WFL for tasks assessed/performed Overall Cognitive Status: Within Functional Limits for tasks assessed    Mobility  Bed Mobility Bed Mobility: Supine to Sit;Sitting - Scoot to Edge of Bed Supine to Sit: 4: Min assist;With rails;HOB flat (For LLE only) Sitting - Scoot to Edge of Bed: 4: Min assist;With rail (for LLE only) Transfers Transfers: Sit to Stand;Stand to Sit Sit to Stand: 4: Min guard;With upper extremity assist;From bed Stand to Sit: 4: Min guard;With upper extremity assist;With armrests;To chair/3-in-1          End of Session OT - End of Session Equipment Utilized During Treatment: Rolling walker Activity Tolerance: Patient limited by fatigue;Patient limited by pain Patient left: in chair;with call bell/phone within reach       Evette Georges [phone] 11/05/2012, 10:13 AM

## 2012-11-05 NOTE — Progress Notes (Addendum)
Physical Therapy Treatment Patient Details Name: [name] MRN: [id number] DOB: Dec 19, 1955 Today's Date: 11/05/2012 Time: 6295-2841 PT Time Calculation (min): 17 min  PT Assessment / Plan / Recommendation  History of Present Illness Pt tripped and fell after hiking, sustaining left tibial plateau fx and underwent external fixation. SHe has a history of bilateral posterior approach hip replacements (both more than a yr ago) as well as right shoulder surgery for OA. Pt had external fixator removed now. has been at [facility] for rehab since [facility]. pt plans to D/C home.     PT Comments   Pt progressing well with therapy. Does still fatigue quickly but can amb short distances with min guard (A). Plans for D/C home tomorrow. Will need to practice bed mobility prior D/C home. Will cont to f/u with pt in acute setting to maximize functional mobility till D/C home.   Follow Up Recommendations  Home health PT;Supervision/Assistance - 24 hour     Does the patient have the potential to tolerate intense rehabilitation     Barriers to Discharge        Equipment Recommendations  None recommended by PT    Recommendations for Other Services    Frequency Min 5X/week   Progress towards PT Goals Progress towards PT goals: Progressing toward goals  Plan Current plan remains appropriate    Precautions / Restrictions Precautions Precautions: Fall Required Braces or Orthoses: Other Brace/Splint Other Brace/Splint: hinged brace  Restrictions Weight Bearing Restrictions: Yes LLE Weight Bearing: Non weight bearing Other Position/Activity Restrictions: no resistive extension; gentle AROM is allowed    Pertinent Vitals/Pain 0/10 at rest; patient repositioned for comfort     Mobility  Bed Mobility Bed Mobility: Not assessed Supine to Sit: 4: Min assist;With rails;HOB flat (For LLE only) Sitting - Scoot to Edge of Bed: 4: Min assist;With rail (for LLE  only) Details for Bed Mobility Assistance: pt did not want to get back in bed at this time to practice bed mobility; discussed bed mobility with pt and pt stated she is having difficulty getting in and OOB; will plan to practice next session prior to D/C home  Transfers Transfers: Sit to Stand;Stand to Sit Sit to Stand: 4: Min guard;From chair/3-in-1;With upper extremity assist Stand to Sit: 4: Min guard;To chair/3-in-1;With armrests Details for Transfer Assistance: VC's for hand placement and min guard to steady and for safety  Ambulation/Gait Ambulation/Gait Assistance: 4: Min guard Ambulation Distance (Feet): 20 Feet Assistive device: Rolling walker Ambulation/Gait Assistance Details: min guard to steady and for safety; pt does good job maintaining NWB status ; fatigues quickly and requires standing rest break Gait Pattern: Trunk flexed (hop to ) Gait velocity: decreased  Stairs: No Wheelchair Mobility Wheelchair Mobility: No    Exercises General Exercises - Lower Extremity Ankle Circles/Pumps: AROM;Both;10 reps   PT Diagnosis:    PT Problem List:   PT Treatment Interventions:     PT Goals (current goals can now be found in the care plan section) Acute Rehab PT Goals Patient Stated Goal: home tomorrow  PT Goal Formulation: With patient Time For Goal Achievement: 11/11/12 Potential to Achieve Goals: Good  Visit Information  Last PT Received On: 11/05/12 Assistance Needed: +1 History of Present Illness: Pt tripped and fell after hiking, sustaining left tibial plateau fx and underwent external fixation. SHe has a history of bilateral posterior approach hip replacements (both more than a yr ago) as well as right shoulder surgery for OA.  Pt had external fixator removed now. has been at [facility] for rehab since [facility]. pt plans to D/C home.      Subjective Data  Subjective: pt sitting in chair and agreeable to therapy; "ill walk and do  whatever you want"  Patient Stated Goal: home tomorrow    Cognition  Cognition Arousal/Alertness: Awake/alert Behavior During Therapy: WFL for tasks assessed/performed Overall Cognitive Status: Within Functional Limits for tasks assessed    Balance  Balance Balance Assessed: Yes Static Standing Balance Static Standing - Balance Support: Bilateral upper extremity supported Static Standing - Level of Assistance:  (min guard )  End of Session PT - End of Session Equipment Utilized During Treatment: Gait belt Activity Tolerance: Patient tolerated treatment well Patient left: in chair;with call bell/phone within reach Nurse Communication: Mobility status   GP     Donell Sievert, Rodney [phone] 11/05/2012, 12:38 PM

## 2012-11-05 NOTE — Progress Notes (Signed)
[facility] Progress Note  Subjective  Doing better No new issues Pain tolerable  + flatus No BM No CP/SOB No N/V No lightheadedness or dizziness    Objective   BP 96/45  Pulse 78  Temp(Src) 98.8 F (37.1 C) (Oral)  Resp 18  Ht 5\' 3"  (1.6 m)  Wt 99.791 kg (220 lb)  BMI 38.98 kg/m2  SpO2 98%  Intake/Output     10/01 0701 - 10/02 0700 10/02 0701 - 10/03 0700   P.O. 1080    I.V. (mL/kg) 1728.3 (17.3)    Total Intake(mL/kg) 2808.3 (28.1)    Urine (mL/kg/hr) 550 (0.2)    Total Output 550     Net +2258.3          Urine Occurrence 3 x      Labs  Results for ADWOA, AXE (MRN [id number]) as of 11/05/2012 10:19  Ref. Range 11/05/2012 03:31  Sodium Latest Range: 135-145 mEq/L 139  Potassium Latest Range: 3.5-5.1 mEq/L 4.5  Chloride Latest Range: 96-112 mEq/L 103  CO2 Latest Range: 19-32 mEq/L 29  BUN Latest Range: 6-23 mg/dL 5 (L)  Creatinine Latest Range: 0.50-1.10 mg/dL 9.56  Calcium Latest Range: 8.4-10.5 mg/dL 8.6  GFR calc non Af Amer Latest Range: >90 mL/min >90  GFR calc Af Amer Latest Range: >90 mL/min >90  Glucose Latest Range: 70-99 mg/dL 213 (H)  WBC Latest Range: 4.0-10.5 K/uL 7.4  RBC Latest Range: 3.87-5.11 MIL/uL 3.36 (L)  Hemoglobin Latest Range: 12.0-15.0 g/dL 08.6 (L)  HCT Latest Range: 36.0-46.0 % 31.5 (L)  MCV Latest Range: 78.0-100.0 fL 93.8  MCH Latest Range: 26.0-34.0 pg 30.7  MCHC Latest Range: 30.0-36.0 g/dL 57.8  RDW Latest Range: 11.5-15.5 % 12.8  Platelets Latest Range: 150-400 K/uL 320   Exam  Gen: sitting in bedside chair, NAD, appears comfortable  Lungs: clear B  Cardiac: s1 and s2 Abd: + BS, NTND Ext:        Left Lower Extremity               Dressing c/d/i  Hinged knee brace fitting well              Ext warm             + DP pulse             EHL, FHL, AT, PT, peroneals, gastroc motor intact             Compartments soft and NT             No pain with passive stretch               Swelling  stable    Assessment and Plan   POD/HD#: 2  57 y/o female s/p fall with comminuted L tibial plateau fracture, POD2  1. L tibial plateau fracture, POD2             NWB x 6-8 weeks             Unrestricted ROM, gentle ROM                           Pt did have repair of tibial tubercle but it is stable for AROM, absolutely NO EXTENSION AGAINST RESISTANCE             Ice and elevate             PT/OT evals  Dressing change tomorrow before d/c  Total knee precautions   2. Hx of PVC             Continue BB  3. GERD             protonix  4. Pain             dc pca  Norco, oxy IR, robaxin               5. DVT/PE prophylaxis             Lovenox x 21 days post op             scd's  6. Obesity  7. ID             Completed periop abx  8. Activity:             OOB as tolerated               NWB L leg  11. Hypotension  Continue with ivf   Asymptomatic  May be due to pain meds   10. Dispo:             PT/OT              dc home on tomorrow     Mearl Latin, PA-C [facility] [phone] (P) 11/05/2012 10:18 AM

## 2012-11-06 ENCOUNTER — Telehealth: Payer: Self-pay | Admitting: Cardiovascular Disease

## 2012-11-06 ENCOUNTER — Encounter (HOSPITAL_COMMUNITY): Payer: Self-pay | Admitting: Orthopedic Surgery

## 2012-11-06 DIAGNOSIS — H409 Unspecified glaucoma: Secondary | ICD-10-CM | POA: Diagnosis present

## 2012-11-06 LAB — CBC
HCT: 31.2 % — ABNORMAL LOW (ref 36.0–46.0)
Hemoglobin: 10.5 g/dL — ABNORMAL LOW (ref 12.0–15.0)
MCH: 30.9 pg (ref 26.0–34.0)
MCV: 91.8 fL (ref 78.0–100.0)
Platelets: 306 10*3/uL (ref 150–400)
RBC: 3.4 MIL/uL — ABNORMAL LOW (ref 3.87–5.11)
RDW: 12.7 % (ref 11.5–15.5)

## 2012-11-06 LAB — BASIC METABOLIC PANEL
BUN: 6 mg/dL (ref 6–23)
CO2: 29 mEq/L (ref 19–32)
Chloride: 102 mEq/L (ref 96–112)
Glucose, Bld: 104 mg/dL — ABNORMAL HIGH (ref 70–99)
Potassium: 4.4 mEq/L (ref 3.5–5.1)
Sodium: 139 mEq/L (ref 135–145)

## 2012-11-06 MED ORDER — DSS 100 MG PO CAPS: 100.0000 mg | ORAL_CAPSULE | Freq: Two times a day (BID) | ORAL | Status: DC

## 2012-11-06 MED ORDER — HYDROCODONE-ACETAMINOPHEN 5-325 MG PO TABS: 1.0000 | ORAL_TABLET | Freq: Four times a day (QID) | ORAL | Status: DC | PRN

## 2012-11-06 MED ORDER — OXYCODONE HCL 5 MG PO TABS: 5.0000 mg | ORAL_TABLET | ORAL | Status: DC | PRN

## 2012-11-06 MED ORDER — POLYETHYLENE GLYCOL 3350 17 G PO PACK: 17.0000 g | PACK | Freq: Every day | ORAL | Status: DC | PRN

## 2012-11-06 MED ORDER — ENOXAPARIN SODIUM 40 MG/0.4ML ~~LOC~~ SOLN: 40.0000 mg | SUBCUTANEOUS | Status: DC

## 2012-11-06 MED ORDER — METHOCARBAMOL 500 MG PO TABS: 500.0000 mg | ORAL_TABLET | Freq: Four times a day (QID) | ORAL | Status: DC | PRN

## 2012-11-06 NOTE — Progress Notes (Signed)
[facility]thopaedic Trauma Service (OTS)  Subjective: 3 Days Post-Op Procedure(s) (LRB): OPEN REDUCTION INTERNAL FIXATION (ORIF) TIBIAL PLATEAU FRACTURE AND REMOVAL OF EXTERNAL FIXATION  (Left) REMOVAL EXTERNAL FIXATION LEG (Left) Patient reports pain as mild.   Ambulated well w PT yesterday and eager to go home.  Objective: Current Vitals Blood pressure 107/49, pulse 80, temperature 98.4 F (36.9 C), temperature source Oral, resp. rate 16, height 5\' 3"  (1.6 m), weight 99.791 kg (220 lb), SpO2 95.00%. Vital signs in last 24 hours: Temp:  [98.4 F (36.9 C)-98.9 F (37.2 C)] 98.4 F (36.9 C) (10/03 0535) Pulse Rate:  [80-95] 80 (10/03 0535) Resp:  [16-20] 16 (10/03 0535) BP: (103-134)/(43-61) 107/49 mmHg (10/03 0535) SpO2:  [95 %-98 %] 95 % (10/03 0535)  Intake/Output from previous day: 10/02 0701 - 10/03 0700 In: 1699.2 [P.O.:240; I.V.:1459.2] Out: 800 [Urine:800]  LABS  Recent Labs  11/04/12 0700 11/05/12 0331 11/06/12 0601  HGB 11.3* 10.3* 10.5*    Recent Labs  11/05/12 0331 11/06/12 0601  WBC 7.4 6.1  RBC 3.36* 3.40*  HCT 31.5* 31.2*  PLT 320 306    Recent Labs  11/05/12 0331 11/06/12 0601  NA 139 139  K 4.5 4.4  CL 103 102  CO2 29 29  BUN 5* 6  CREATININE 0.67 0.67  GLUCOSE 100* 104*  CALCIUM 8.6 8.9   No results found for this basename: LABPT, INR,  in the last 72 hours  Physical Exam  LLE Sens DPN, SPN, TN intact  Motor EHL, ext, flex, evers 5/5  DP 2+, PT 2+  Hinge in place  Incision c/d/i  Imaging No results found.  Assessment/Plan: 3 Days Post-Op Procedure(s) (LRB): OPEN REDUCTION INTERNAL FIXATION (ORIF) TIBIAL PLATEAU FRACTURE AND REMOVAL OF EXTERNAL FIXATION  (Left) REMOVAL EXTERNAL FIXATION LEG (Left)  D/c to home NWB Lovenox AROM, PROM of knee  Myrene Galas, MD [facility] [phone] (p)   11/06/2012, 8:53 AM

## 2012-11-06 NOTE — Telephone Encounter (Signed)
New Problem:  PT states she just got out of the hospital. Pt states she was taking lopressor 12.5 mg twice a day in the hospital... Pt states her discharge documents indicate she is still suppose to be taking it. However, the pt does not have a Rx for it. Pt would like to know if she is suppose to be taking it. If she needs it could someone call it into [facility] in [place].

## 2012-11-06 NOTE — Discharge Summary (Signed)
Orthopaedic Trauma Service (OTS)  Patient ID: [name] MRN: [id number] DOB/AGE: April 07, 1955 57 y.o.  Admit date: 11/03/2012 Discharge date: 11/06/2012  Admission Diagnoses: Closed fracture of left tibial plateau, bicondylar, delayed fixation Status post application of spanning external fixator GERD Obesity History of PVCs  Discharge Diagnoses:  Active Problems:   Closed fracture of tibial plateau, Left bicondylar    GERD (gastroesophageal reflux disease)   Obesity (BMI 30-39.9)   Procedures Performed: 11/03/2012- Dr. Carola Frost 1. Open reduction and internal fixation of bicondylar left tibial  plateau.  2. Removal of external fixator under anesthesia.  3. Anterior compartment fasciotomy.  4. Debridement of ulcerated fixator pin sites.    Discharged Condition: good  Hospital Course:   Patient is a 57 year old white female who is well-known to the orthopedic trauma service after sustaining a fall on 10/17/2012. At that time she sustained a complex left bicondylar tibial plateau fracture. She was admitted to [facility] for evaluation and treatment. Secondary to soft tissue swelling and deformity patient was placed into a spanning external fixator as we awaited soft tissue swelling resolution to proceed with definitive fixation. Patient was then discharged to a skilled nursing facility to facilitate mobilization. Patient presented on this hospitalization for her definitive fixation. She was taken to the operating room on the date noted above where the spanning external fixator was removed and formal open reduction and internal fixation of her comminuted left bicondylar tibial plateau fracture was performed. Patient tolerated the procedure very well. There were no perioperative issues. Last admission she was seen and evaluated by cardiology due to the PVCs and was started on a beta blocker. No cardiac issues during this hospitalization.  On postoperative day #1 patient was  restarted on her Lovenox for DVT and PE prophylaxis we'll continue this for another 21 days. She began to work with physical therapy on postoperative day #1 as well and was fitted for a hinged knee brace. Again no issues were noted during her hospital stay. On postoperative day #3 she was deemed to be stable for discharge to home. Patient was utilizing a PCA on postoperative day #1 and postoperative day #2. Her PCA was discontinued late postoperative day #2 she was transitioned to oral pain medications. She was getting adequate pain control with oral pain medications. She was tolerating a regular diet, voiding and had a bowel movement.  Patient was discharged on 11/06/2012. She was discharged to home with home health PT, she did not want to return to skilled nursing facility as she felt safe in performing her ADLs on her own and was mobilizing on her without tremendous difficulty.    Consults: None  Significant Diagnostic Studies: labs:  Results for Cassandra, Mcneil (MRN [id number]) as of 11/06/2012 09:24  Ref. Range 11/06/2012 06:01  Sodium Latest Range: 135-145 mEq/L 139  Potassium Latest Range: 3.5-5.1 mEq/L 4.4  Chloride Latest Range: 96-112 mEq/L 102  CO2 Latest Range: 19-32 mEq/L 29  BUN Latest Range: 6-23 mg/dL 6  Creatinine Latest Range: 0.50-1.10 mg/dL 7.82  Calcium Latest Range: 8.4-10.5 mg/dL 8.9  GFR calc non Af Amer Latest Range: >90 mL/min >90  GFR calc Af Amer Latest Range: >90 mL/min >90  Glucose Latest Range: 70-99 mg/dL 956 (H)  WBC Latest Range: 4.0-10.5 K/uL 6.1  RBC Latest Range: 3.87-5.11 MIL/uL 3.40 (L)  Hemoglobin Latest Range: 12.0-15.0 g/dL 21.3 (L)  HCT Latest Range: 36.0-46.0 % 31.2 (L)  MCV Latest Range: 78.0-100.0 fL 91.8  MCH Latest  Range: 26.0-34.0 pg 30.9  MCHC Latest Range: 30.0-36.0 g/dL 16.1  RDW Latest Range: 11.5-15.5 % 12.7  Platelets Latest Range: 150-400 K/uL 306    Treatments: IV hydration, antibiotics: vancomycin, analgesia: acetaminophen,  Dilaudid and  oxycodone, Norco, cardiac meds: metoprolol, anticoagulation: LMW heparin, therapies: PT, OT and RN and surgery: As above  Discharge Exam:        Orthopaedic Trauma Service (OTS)  Subjective: 3 Days Post-Op Procedure(s) (LRB): OPEN REDUCTION INTERNAL FIXATION (ORIF) TIBIAL PLATEAU FRACTURE AND REMOVAL OF EXTERNAL FIXATION  (Left) REMOVAL EXTERNAL FIXATION LEG (Left) Patient reports pain as mild.   Ambulated well w PT yesterday and eager to go home.  Objective: Current Vitals Blood pressure 107/49, pulse 80, temperature 98.4 F (36.9 C), temperature source Oral, resp. rate 16, height 5\' 3"  (1.6 m), weight 99.791 kg (220 lb), SpO2 95.00%. Vital signs in last 24 hours: Temp:  [98.4 F (36.9 C)-98.9 F (37.2 C)] 98.4 F (36.9 C) (10/03 0535) Pulse Rate:  [80-95] 80 (10/03 0535) Resp:  [16-20] 16 (10/03 0535) BP: (103-134)/(43-61) 107/49 mmHg (10/03 0535) SpO2:  [95 %-98 %] 95 % (10/03 0535)  Intake/Output from previous day: 10/02 0701 - 10/03 0700 In: 1699.2 [P.O.:240; I.V.:1459.2] Out: 800 [Urine:800]  LABS  Recent Labs   11/04/12 0700  11/05/12 0331  11/06/12 0601   HGB  11.3*  10.3*  10.5*     Recent Labs   11/05/12 0331  11/06/12 0601   WBC  7.4  6.1   RBC  3.36*  3.40*   HCT  31.5*  31.2*   PLT  320  306     Recent Labs   11/05/12 0331  11/06/12 0601   NA  139  139   K  4.5  4.4   CL  103  102   CO2  29  29   BUN  5*  6   CREATININE  0.67  0.67   GLUCOSE  100*  104*   CALCIUM  8.6  8.9    No results found for this basename: LABPT, INR,  in the last 72 hours  Physical Exam  LLE      Sens DPN, SPN, TN intact             Motor EHL, ext, flex, evers 5/5             DP 2+, PT 2+             Hinge in place             Incision c/d/i  Imaging No results found.  Assessment/Plan: 3 Days Post-Op Procedure(s) (LRB): OPEN REDUCTION INTERNAL FIXATION (ORIF) TIBIAL PLATEAU FRACTURE AND REMOVAL OF EXTERNAL FIXATION  (Left) REMOVAL  EXTERNAL FIXATION LEG (Left)  D/c to home NWB Lovenox AROM, PROM of knee  Myrene Galas, MD [facility] [phone] (p)   11/06/2012, 8:53 AM   Disposition: 01-Home or Self Care  Discharge Orders   Future Appointments Provider Department Dept Phone   12/30/2012 2:00 PM Wendall Stade, MD [facility] [place] Office [phone]   Future Orders Complete By Expires   Call MD / Call 911  As directed    Comments:     If you experience chest pain or shortness of breath, CALL 911 and be transported to the hospital emergency room.  If you develope a fever above 101 F, pus (white drainage) or increased drainage or redness at the wound, or calf pain,  call your surgeon's office.   Constipation Prevention  As directed    Comments:     Drink plenty of fluids.  Prune juice may be helpful.  You may use a stool softener, such as Colace (over the counter) 100 mg twice a day.  Use MiraLax (over the counter) for constipation as needed.   Diet - low sodium heart healthy  As directed    Discharge instructions  As directed    Comments:     Orthopaedic Trauma Service Discharge Instructions   General Discharge Instructions  WEIGHT BEARING STATUS: Nonweightbearing Left leg  RANGE OF MOTION/ACTIVITY: Range of motion as tolerated Left knee.  Do not place pillow under knee at rest. If you want to elevate your leg place pillow under ankle   Diet: as you were eating previously.  Can use over the counter stool softeners and bowel preparations, such as Miralax, to help with bowel movements.  Narcotics can be constipating.  Be sure to drink plenty of fluids  STOP SMOKING OR USING NICOTINE PRODUCTS!!!!  As discussed nicotine severely impairs your body's ability to heal surgical and traumatic wounds but also impairs bone healing.  Wounds and bone heal by forming microscopic blood vessels (angiogenesis) and nicotine is a vasoconstrictor (essentially, shrinks blood  vessels).  Therefore, if vasoconstriction occurs to these microscopic blood vessels they essentially disappear and are unable to deliver necessary nutrients to the healing tissue.  This is one modifiable factor that you can do to dramatically increase your chances of healing your injury.    (This means no smoking, no nicotine gum, patches, etc)  DO NOT USE NONSTEROIDAL ANTI-INFLAMMATORY DRUGS (NSAID'S)  Using products such as Advil (ibuprofen), Aleve (naproxen), Motrin (ibuprofen) for additional pain control during fracture healing can delay and/or prevent the healing response.  If you would like to take over the counter (OTC) medication, Tylenol (acetaminophen) is ok.  However, some narcotic medications that are given for pain control contain acetaminophen as well. Therefore, you should not exceed more than 4000 mg of tylenol in a day if you do not have liver disease.  Also note that there are may OTC medicines, such as cold medicines and allergy medicines that my contain tylenol as well.  If you have any questions about medications and/or interactions please ask your doctor/PA or your pharmacist.   PAIN MEDICATION USE AND EXPECTATIONS  You have likely been given narcotic medications to help control your pain.  After a traumatic event that results in an fracture (broken bone) with or without surgery, it is ok to use narcotic pain medications to help control one's pain.  We understand that everyone responds to pain differently and each individual patient will be evaluated on a regular basis for the continued need for narcotic medications. Ideally, narcotic medication use should last no more than 6-8 weeks (coinciding with fracture healing).   As a patient it is your responsibility as well to monitor narcotic medication use and report the amount and frequency you use these medications when you come to your office visit.   We would also advise that if you are using narcotic medications, you should take a dose  prior to therapy to maximize you participation.  IF YOU ARE ON NARCOTIC MEDICATIONS IT IS NOT PERMISSIBLE TO OPERATE A MOTOR VEHICLE (MOTORCYCLE/CAR/TRUCK/MOPED) OR HEAVY MACHINERY DO NOT MIX NARCOTICS WITH OTHER CNS (CENTRAL NERVOUS SYSTEM) DEPRESSANTS SUCH AS ALCOHOL       ICE AND ELEVATE INJURED/OPERATIVE EXTREMITY  Using ice and elevating the  injured extremity above your heart can help with swelling and pain control.  Icing in a pulsatile fashion, such as 20 minutes on and 20 minutes off, can be followed.    Do not place ice directly on skin. Make sure there is a barrier between to skin and the ice pack.    Using frozen items such as frozen peas works well as the conform nicely to the are that needs to be iced.  USE AN ACE WRAP OR TED HOSE FOR SWELLING CONTROL  In addition to icing and elevation, Ace wraps or TED hose are used to help limit and resolve swelling.  It is recommended to use Ace wraps or TED hose until you are informed to stop.    When using Ace Wraps start the wrapping distally (farthest away from the body) and wrap proximally (closer to the body)   Example: If you had surgery on your leg or thing and you do not have a splint on, start the ace wrap at the toes and work your way up to the thigh        If you had surgery on your upper extremity and do not have a splint on, start the ace wrap at your fingers and work your way up to the upper arm  IF YOU ARE IN A SPLINT OR CAST DO NOT REMOVE IT FOR ANY REASON   If your splint gets wet for any reason please contact the office immediately. You may shower in your splint or cast as long as you keep it dry.  This can be done by wrapping in a cast cover or garbage back (or similar)  Do Not stick any thing down your splint or cast such as pencils, money, or hangers to try and scratch yourself with.  If you feel itchy take benadryl as prescribed on the bottle for itching  IF YOU ARE IN A CAM BOOT (BLACK BOOT)  You may remove boot  periodically. Perform daily dressing changes as noted below.  Wash the liner of the boot regularly and wear a sock when wearing the boot. It is recommended that you sleep in the boot until told otherwise  CALL THE OFFICE WITH ANY QUESTIONS OR CONCERTS: [phone]     Discharge Pin Site Instructions  Dress pins daily with Kerlix roll starting on POD 2. Wrap the Kerlix so that it tamps the skin down around the pin-skin interface to prevent/limit motion of the skin relative to the pin.  (Pin-skin motion is the primary cause of pain and infection related to external fixator pin sites).  Remove any crust or coagulum that may obstruct drainage with a saline moistened gauze or soap and water.  After POD 3, if there is no discernable drainage on the pin site dressing, the interval for change can by increased to every other day.  You may shower with the fixator, cleaning all pin sites gently with soap and water.  If you have a surgical wound this needs to be completely dry and without drainage before showering.  The extremity can be lifted by the fixator to facilitate wound care and transfers.  Notify the office/Doctor if you experience increasing drainage, redness, or pain from a pin site, or if you notice purulent (thick, snot-like) drainage.  Discharge Wound Care Instructions  Do NOT apply any ointments, solutions or lotions to pin sites or surgical wounds.  These prevent needed drainage and even though solutions like hydrogen peroxide kill bacteria, they also damage cells lining the  pin sites that help fight infection.  Applying lotions or ointments can keep the wounds moist and can cause them to breakdown and open up as well. This can increase the risk for infection. When in doubt call the office.  Surgical incisions should be dressed daily.  If any drainage is noted, use one layer of adaptic, then gauze, Kerlix, and an ace wrap.  Once the incision is completely dry and without drainage, it  may be left open to air out.  Showering may begin 36-48 hours later.  Cleaning gently with soap and water.  Traumatic wounds should be dressed daily as well.    One layer of adaptic, gauze, Kerlix, then ace wrap.  The adaptic can be discontinued once the draining has ceased    If you have a wet to dry dressing: wet the gauze with saline the squeeze as much saline out so the gauze is moist (not soaking wet), place moistened gauze over wound, then place a dry gauze over the moist one, followed by Kerlix wrap, then ace wrap.   Do not put a pillow under the knee. Place it under the heel.  As directed    Driving restrictions  As directed    Comments:     No driving   Increase activity slowly as tolerated  As directed    Non weight bearing  As directed        Medication List         cholecalciferol 1000 UNITS tablet  Commonly known as:  VITAMIN D  Take 1 tablet (1,000 Units total) by mouth 2 (two) times daily.     DSS 100 MG Caps  Take 100 mg by mouth 2 (two) times daily.     enoxaparin 40 MG/0.4ML injection  Commonly known as:  LOVENOX  Inject 0.4 mLs (40 mg total) into the skin daily.     HYDROcodone-acetaminophen 5-325 MG per tablet  Commonly known as:  NORCO/VICODIN  Take 1-2 tablets by mouth every 6 (six) hours as needed for pain.     methocarbamol 500 MG tablet  Commonly known as:  ROBAXIN  Take 1-2 tablets (500-1,000 mg total) by mouth every 6 (six) hours as needed (spasms).     metoprolol tartrate 12.5 mg Tabs tablet  Commonly known as:  LOPRESSOR  Take 0.5 tablets (12.5 mg total) by mouth 2 (two) times daily.     multivitamin with minerals Tabs tablet  Take 1 tablet by mouth daily.     omeprazole 20 MG capsule  Commonly known as:  PRILOSEC  Take 20 mg by mouth daily.     oxyCODONE 5 MG immediate release tablet  Commonly known as:  Oxy IR/ROXICODONE  Take 1-2 tablets (5-10 mg total) by mouth every 3 (three) hours as needed (breakthrough pain only).      polyethylene glycol packet  Commonly known as:  MIRALAX / GLYCOLAX  Take 17 g by mouth daily as needed.     TRAVATAN Z 0.004 % Soln ophthalmic solution  Generic drug:  Travoprost (BAK Free)  Place 1 drop into both eyes at bedtime.           Follow-up Information   Follow up with HANDY,MICHAEL H, MD. Schedule an appointment as soon as possible for a visit in 14 days. (call for appointment )    Specialty:  Orthopedic Surgery   Contact information:   [address] [place] [address] [phone]  Discharge Instructions and Plan:  Mrs. Chirino has sustained a fairly severe injury to the left knee. We were able to achieve excellent fixation with plate osteosynthesis. It was a technically successful procedure. We were able to restore alignment, assess for meniscal injury, address stability and restored joint surface congruity which are all favorable to a successful outcome. Patient is still at risk for the development of posttraumatic arthritis which has been discussed at length and we will continue to monitor the patient for signs and symptoms of such. Patient will be nonweightbearing for the next 6-8 weeks. Unrestricted range of motion of the left knee in the hinged knee brace. total knee precautions should be followed when at rest. It is okay for her to remove the hinged knee brace if it is impairing her ability to perform range of motion exercises We will refer her to outpatient physical therapy after the first postoperative visit.  Mrs. Helfrich will be on Lovenox for DVT/PE prophylaxis for 21 days Daily dressing changes should be performed as per discharge wound care instructions. Patient can resume prehospital diet Patient will be on oxycodone, Norco, Robaxin for pain control We will see the patient back in the office in 10-14 days for reevaluation, followup x-rays and removal of sutures. Should the patient have any questions for the  first postoperative visit and encouraged to contact the office immediately. Patient will be vigilant for any redness increased drainage and increased pain, which are suggestive of infection and will contact the office immediately. Patient will also be vigilant for fevers or chills or any other concerning signs. They will contact the office if any of these develop.   Signed:  Mearl Latin, PA-C [facility] [phone] (P) 11/06/2012, 9:16 AM

## 2012-11-06 NOTE — Progress Notes (Signed)
Occupational Therapy Treatment Patient Details Name: [name] MRN: [id number] DOB: September 01, 1955 Today's Date: 11/06/2012 Time: 4098-1191 OT Time Calculation (min): 25 min  OT Assessment / Plan / Recommendation  History of present illness Pt tripped and fell after hiking, sustaining left tibial plateau fx and underwent external fixation. SHe has a history of bilateral posterior approach hip replacements (both more than a yr ago) as well as right shoulder surgery for OA. Pt had external fixator removed now. has been at [facility] for rehab since last admission to incr strength and mobility. pt plans to D/C home.     OT comments  Pt making good progress. Focus of session on AE for LB ADLs.  Pt anticipates return home later today.  Follow Up Recommendations  No OT follow up    Barriers to Discharge       Equipment Recommendations  None recommended by OT    Recommendations for Other Services    Frequency Min 2X/week   Progress towards OT Goals Progress towards OT goals: Progressing toward goals  Plan Discharge plan remains appropriate    Precautions / Restrictions Precautions Precautions: Fall Required Braces or Orthoses: Other Brace/Splint Other Brace/Splint: hinged brace  Restrictions LLE Weight Bearing: Non weight bearing Other Position/Activity Restrictions: no resistive extension; gentle AROM is allowed    Pertinent Vitals/Pain See vitals    ADL  Upper Body Bathing: Simulated;Set up Where Assessed - Upper Body Bathing: Unsupported sitting Lower Body Bathing: Simulated;Minimal assistance Where Assessed - Lower Body Bathing: Supported sit to stand Upper Body Dressing: Performed;Set up Where Assessed - Upper Body Dressing: Unsupported sitting Lower Body Dressing: Performed;Minimal assistance Where Assessed - Lower Body Dressing: Unsupported sit to stand Toilet Transfer: Performed;Min guard Toilet Transfer Method: Sit to Barista: Raised  toilet seat with arms (or 3-in-1 over toilet) Toileting - Clothing Manipulation and Hygiene: Performed;Min guard Where Assessed - Engineer, mining and Hygiene: Sit to stand from 3-in-1 or toilet Equipment Used: Rolling walker;Reacher;Long-handled sponge;Long-handled shoe horn;Sock aid Transfers/Ambulation Related to ADLs: Min guard with RW ADL Comments: Reviewed use of AE for LB ADLs (pt has AE at home).  Pt has not yet had BM since in hospital and verbalized concern to reach to back peri area while sitting. Educated pt on use of long handled tongs as toilet aid and pt verbalized understanding.  Pt performed full UB/LB dressing technique in prep for returning home.  Assist needed for donning shorts due to cloth material getting stuck to hinge brace velcro.     OT Diagnosis:    OT Problem List:   OT Treatment Interventions:     OT Goals(current goals can now be found in the care plan section) Acute Rehab OT Goals Patient Stated Goal: home today OT Goal Formulation: With patient Time For Goal Achievement: 11/11/12 Potential to Achieve Goals: Good ADL Goals Pt Will Perform Lower Body Dressing: with set-up;with supervision;with adaptive equipment;sit to/from stand Pt Will Transfer to Toilet: with supervision;ambulating;bedside commode Pt Will Perform Toileting - Clothing Manipulation and hygiene: with supervision;sit to/from stand  Visit Information  Last OT Received On: 11/06/12 Assistance Needed: +1 History of Present Illness: Pt tripped and fell after hiking, sustaining left tibial plateau fx and underwent external fixation. SHe has a history of bilateral posterior approach hip replacements (both more than a yr ago) as well as right shoulder surgery for OA. Pt had external fixator removed now. has been at [facility] for rehab since last admission to incr strength and  mobility. pt plans to D/C home.      Subjective Data      Prior Functioning       Cognition   Cognition Arousal/Alertness: Awake/alert Behavior During Therapy: WFL for tasks assessed/performed Overall Cognitive Status: Within Functional Limits for tasks assessed    Mobility  Bed Mobility Bed Mobility: Not assessed Supine to Sit: 4: Min assist;HOB flat Sitting - Scoot to Edge of Bed: 5: Supervision Sit to Supine: 5: Supervision;HOB flat Details for Bed Mobility Assistance: Minor assist to support LLE but overall pt moving well.   Transfers Transfers: Sit to Stand;Stand to Sit Sit to Stand: 4: Min guard;From chair/3-in-1;With armrests;With upper extremity assist Stand to Sit: 4: Min guard;To chair/3-in-1;With armrests;With upper extremity assist Details for Transfer Assistance: Pt demonstrated safe technique.      Exercises      Balance     End of Session OT - End of Session Equipment Utilized During Treatment: Rolling walker Activity Tolerance: Patient tolerated treatment well Patient left: in chair;with call bell/phone within reach;with family/visitor present Nurse Communication: Mobility status  GO    11/06/2012 Cipriano Mile OTR/L Pager [phone] Office [phone]  Cipriano Mile 11/06/2012, 12:47 PM

## 2012-11-06 NOTE — Progress Notes (Signed)
Physical Therapy Treatment Patient Details Name: [name] MRN: [id number] DOB: 1955/07/12 Today's Date: 11/06/2012 Time: 1191-4782 PT Time Calculation (min): 18 min  PT Assessment / Plan / Recommendation  History of Present Illness Pt tripped and fell after hiking, sustaining left tibial plateau fx and underwent external fixation. SHe has a history of bilateral posterior approach hip replacements (both more than a yr ago) as well as right shoulder surgery for OA. Pt had external fixator removed now. has been at [facility] for rehab since [facility]. pt plans to D/C home.     PT Comments   Pt pleasant & willing to participate in therapy.  C/o LLE pain upon arrival stating MD had just been in to change dressing on LE & performed ROM exercises.  Focus of session was bed mobility.  Pt required very minimal assist for LLE OOB.  Pt feels comfortable with d/cing home today.     Follow Up Recommendations  Home health PT;Supervision/Assistance - 24 hour     Does the patient have the potential to tolerate intense rehabilitation     Barriers to Discharge        Equipment Recommendations  None recommended by PT    Recommendations for Other Services OT consult  Frequency Min 5X/week   Progress towards PT Goals Progress towards PT goals: Progressing toward goals  Plan Current plan remains appropriate    Precautions / Restrictions Precautions Precautions: Fall Required Braces or Orthoses: Other Brace/Splint Other Brace/Splint: hinged brace  Restrictions Weight Bearing Restrictions: Yes LLE Weight Bearing: Non weight bearing Other Position/Activity Restrictions: no resistive extension; gentle AROM is allowed    Pertinent Vitals/Pain C/o LLE pain.  Did not rate.      Mobility  Bed Mobility Bed Mobility: Supine to Sit;Sitting - Scoot to Edge of Bed;Sit to Supine Supine to Sit: 4: Min assist;HOB flat Sitting - Scoot to Edge of Bed: 5:  Supervision Sit to Supine: 5: Supervision;HOB flat Details for Bed Mobility Assistance:  Instructed pt on hooking technique.   Minor assist to support LLE but overall pt moving well.   Transfers Transfers: Sit to Stand;Stand to Sit Sit to Stand: 4: Min guard;With upper extremity assist;From bed Stand to Sit: 4: Min guard;With upper extremity assist;With armrests;To chair/3-in-1 Details for Transfer Assistance: Pt demonstrated safe technique.   Ambulation/Gait Ambulation/Gait Assistance: 4: Min guard Ambulation Distance (Feet): 8 Feet Assistive device: Rolling walker Ambulation/Gait Assistance Details: Distance limited due to pain & fatigue.  Pt states MD had just been in to re-dress & perform ROM with LLE.   Gait Pattern: Step-to pattern Gait velocity: decreased  General Gait Details: maintains NWBing LLE>   Stairs: No Wheelchair Mobility Wheelchair Mobility: No      PT Goals (current goals can now be found in the care plan section) Acute Rehab PT Goals PT Goal Formulation: With patient Time For Goal Achievement: 11/11/12 Potential to Achieve Goals: Good  Visit Information  Last PT Received On: 11/06/12 Assistance Needed: +1 History of Present Illness: Pt tripped and fell after hiking, sustaining left tibial plateau fx and underwent external fixation. SHe has a history of bilateral posterior approach hip replacements (both more than a yr ago) as well as right shoulder surgery for OA. Pt had external fixator removed now. has been at [facility] for rehab since last admission to [facility]. pt plans to D/C home.      Subjective Data      Cognition  Cognition Arousal/Alertness: Awake/alert Behavior During Therapy: WFL for tasks assessed/performed Overall Cognitive Status: Within Functional Limits for tasks assessed    Balance     End of Session PT - End of Session Equipment Utilized During Treatment: Gait belt Activity Tolerance: Patient limited by  pain;Patient tolerated treatment well Patient left: in chair;with call bell/phone within reach Nurse Communication: Mobility status   GP     Lara Mulch 11/06/2012, 11:44 AM   Verdell Face, PTA [phone] 11/06/2012

## 2012-11-06 NOTE — Progress Notes (Signed)
11/06/12 Spoke with patient and her husband about HHC, she selected [facility]. Contacted Strother Everitt with [facility] and set up HHPT. She already a rolling walker and 3N1, no equipment needs identified.  Jacquelynn Cree RN,BSN, CCM

## 2012-11-09 MED ORDER — METOPROLOL TARTRATE 25 MG PO TABS: 12.5000 mg | ORAL_TABLET | Freq: Two times a day (BID) | ORAL | Status: DC

## 2012-11-09 NOTE — Telephone Encounter (Signed)
REFILL DONE  PT  AWARE .[name]

## 2012-11-10 ENCOUNTER — Other Ambulatory Visit: Payer: Self-pay

## 2012-11-10 MED ORDER — METOPROLOL TARTRATE 25 MG PO TABS: 12.5000 mg | ORAL_TABLET | Freq: Two times a day (BID) | ORAL | Status: DC

## 2012-12-10 ENCOUNTER — Other Ambulatory Visit: Payer: Self-pay

## 2012-12-30 ENCOUNTER — Ambulatory Visit (INDEPENDENT_AMBULATORY_CARE_PROVIDER_SITE_OTHER): Payer: 59 | Admitting: Cardiovascular Disease

## 2012-12-30 ENCOUNTER — Encounter: Payer: Self-pay | Admitting: Cardiovascular Disease

## 2012-12-30 VITALS — BP 110/60 | HR 68 | Ht 63.0 in | Wt 213.0 lb

## 2012-12-30 DIAGNOSIS — I4949 Other premature depolarization: Secondary | ICD-10-CM

## 2012-12-30 DIAGNOSIS — R0609 Other forms of dyspnea: Secondary | ICD-10-CM

## 2012-12-30 DIAGNOSIS — R0600 Dyspnea, unspecified: Secondary | ICD-10-CM

## 2012-12-30 DIAGNOSIS — I493 Ventricular premature depolarization: Secondary | ICD-10-CM

## 2012-12-30 DIAGNOSIS — IMO0002 Reserved for concepts with insufficient information to code with codable children: Secondary | ICD-10-CM

## 2012-12-30 DIAGNOSIS — S82142P Displaced bicondylar fracture of left tibia, subsequent encounter for closed fracture with malunion: Secondary | ICD-10-CM

## 2012-12-30 NOTE — Progress Notes (Signed)
Patient ID: [name], female   DOB: [birth year], 57 y.o.   MRN: [id number] 57 yo recently seen on hospital for preop clearance Had left tibial plateau fracture while hiking. Had pins placed and had more definitive reduction and Rx 9/14 by Dr Carola Frost . Echo normal. Benign ambient PVC;s Telemetry and intra operative course benign in hospital. Doing well at [facility] center. Less knee pain Swelling improved No angina dyspnea or palpitations. Compliant with meds On lopressor 12.5 bid  Very deconditioned Starting PT  Has exertional dyspnea Clincially still having lots of PVC;s  Needs to have CAD r/o     ROS: Denies fever, malais, weight loss, blurry vision, decreased visual acuity, cough, sputum, SOB, hemoptysis, pleuritic pain, palpitaitons, heartburn, abdominal pain, melena, lower extremity edema, claudication, or rash.  All other systems reviewed and negative  General: Affect appropriate Healthy:  appears stated age HEENT: normal Neck supple with no adenopathy JVP normal no bruits no thyromegaly Lungs clear with no wheezing and good diaphragmatic motion Heart:  S1/S2 no murmur, no rub, gallop or click PMI normal Abdomen: benighn, BS positve, no tenderness, no AAA no bruit.  No HSM or HJR Distal pulses intact with no bruits No edema Neuro non-focal Skin warm and dry LLE in mobile brace    Current Outpatient Prescriptions  Medication Sig Dispense Refill  . cholecalciferol (VITAMIN D) 1000 UNITS tablet Take 1 tablet (1,000 Units total) by mouth 2 (two) times daily.  60 tablet  2  . Docusate Sodium (DSS) 100 MG CAPS Take 100 mg by mouth 2 (two) times daily.  30 each  0  . docusate sodium 100 MG CAPS Take 100 mg by mouth 2 (two) times daily.  10 capsule  0  . enoxaparin (LOVENOX) 40 MG/0.4ML injection Inject 0.4 mLs (40 mg total) into the skin daily.  21 Syringe  0  . HYDROcodone-acetaminophen (NORCO/VICODIN) 5-325 MG per tablet Take 1-2 tablets by mouth every 6 (six) hours as needed for  pain.  90 tablet  0  . methocarbamol (ROBAXIN) 500 MG tablet Take 1-2 tablets (500-1,000 mg total) by mouth every 6 (six) hours as needed (spasms).  80 tablet  0  . metoprolol tartrate (LOPRESSOR) 25 MG tablet Take 0.5 tablets (12.5 mg total) by mouth 2 (two) times daily.  90 tablet  3  . Multiple Vitamin (MULTIVITAMIN WITH MINERALS) TABS tablet Take 1 tablet by mouth daily.  30 tablet  2  . omeprazole (PRILOSEC) 20 MG capsule Take 20 mg by mouth daily.      Marland Kitchen oxyCODONE (OXY IR/ROXICODONE) 5 MG immediate release tablet Take 1-2 tablets (5-10 mg total) by mouth every 3 (three) hours as needed (breakthrough pain only).  50 tablet  0  . polyethylene glycol (MIRALAX / GLYCOLAX) packet Take 17 g by mouth daily as needed.  14 each  0  . TRAVATAN Z 0.004 % SOLN ophthalmic solution Place 1 drop into both eyes at bedtime.        No current facility-administered medications for this visit.    Allergies  Prednisone and Amoxicillin  Electrocardiogram:  11/03/12  SR rate 77 QT 434  PVC nonspecific ST/T Wave changes  Assessment and Plan

## 2012-12-30 NOTE — Assessment & Plan Note (Signed)
Healing well after 2nd surgery Weight bearing finally  PT/OT f/u Dr Carola Frost

## 2012-12-30 NOTE — Assessment & Plan Note (Signed)
Lexiscan myovue to r/o CAD  24 hr holter monitor to quantitate PVC;s continue beta blocker

## 2012-12-30 NOTE — Patient Instructions (Signed)
The current medical regimen is effective;  continue present plan and medications.  Your physician has requested that you have a lexiscan myoview. For further information please visit [email or web address] Please follow instruction sheet, as given.  Your physician has recommended that you wear a holter monitor. Holter monitors are medical devices that record the heart's electrical activity. Doctors most often use these monitors to diagnose arrhythmias. Arrhythmias are problems with the speed or rhythm of the heartbeat. The monitor is a small, portable device. You can wear one while you do your normal daily activities. This is usually used to diagnose what is causing palpitations/syncope (passing out).  Follow up in 6 months with Dr Eden Emms.  You will receive a letter in the mail 2 months before you are due.  Please call us when you receive this letter to schedule your follow up appointment.

## 2012-12-30 NOTE — Assessment & Plan Note (Signed)
Normal exam except for PVC;s  Echo with normal RV and LV function Myovue to r/o anginal equivalent PT/OT

## 2012-12-30 NOTE — Assessment & Plan Note (Signed)
Discussed low carb diet To start using bicycle.  With frequent PVCs will get myovue to clear for exercise program

## 2013-01-04 ENCOUNTER — Inpatient Hospital Stay (HOSPITAL_COMMUNITY): Admission: RE | Admit: 2013-01-04 | Payer: 59 | Source: Ambulatory Visit | Admitting: Physical Therapy

## 2013-01-07 ENCOUNTER — Ambulatory Visit (HOSPITAL_COMMUNITY): Payer: 59 | Admitting: Physical Therapy

## 2013-01-08 ENCOUNTER — Ambulatory Visit (HOSPITAL_COMMUNITY): Payer: 59 | Admitting: Physical Therapy

## 2013-01-11 ENCOUNTER — Ambulatory Visit (HOSPITAL_COMMUNITY)
Admission: RE | Admit: 2013-01-11 | Discharge: 2013-01-11 | Disposition: A | Discharge status: Home / Self Care | Payer: 59 | Source: Ambulatory Visit | Attending: Orthopedic Surgery | Admitting: Orthopedic Surgery

## 2013-01-11 DIAGNOSIS — IMO0001 Reserved for inherently not codable concepts without codable children: Secondary | ICD-10-CM | POA: Insufficient documentation

## 2013-01-11 DIAGNOSIS — M25569 Pain in unspecified knee: Secondary | ICD-10-CM | POA: Insufficient documentation

## 2013-01-11 DIAGNOSIS — M79609 Pain in unspecified limb: Secondary | ICD-10-CM | POA: Insufficient documentation

## 2013-01-11 DIAGNOSIS — M6281 Muscle weakness (generalized): Secondary | ICD-10-CM | POA: Insufficient documentation

## 2013-01-11 DIAGNOSIS — R269 Unspecified abnormalities of gait and mobility: Secondary | ICD-10-CM | POA: Insufficient documentation

## 2013-01-11 NOTE — Evaluation (Signed)
Physical Therapy Evaluation  Patient Details  Name: [name] MRN: [id number] Date of Birth: [birth year]  Today's Date: 01/11/2013 Time: 4403-4742 PT Time Calculation (min): 45 min Charges: 1 evaluation TE: [id number]             Visit#: 1 of 8  Re-eval: 02/10/13 Assessment Diagnosis: Lt tibial plateau fracture Surgical Date: 10/19/12 Next MD Visit: Dr. Carola Frost - 10/14/12  Past Medical History:  Past Medical History  Diagnosis Date  . Allergic rhinitis   . Arthritis   . Glaucoma   . GERD (gastroesophageal reflux disease)   . Venous stasis    Past Surgical History:  Past Surgical History  Procedure Laterality Date  . Tubal ligation    . Cholecystectomy    . Joint replacement Bilateral     Hips   . Back surgery      cervical fusion   . Shoulder surgery    . Vocal cord surgery    . External fixation leg Left 10/20/2012    Procedure: EXTERNAL FIXATION Left Tibial Plateau Fx;  Surgeon: Budd Palmer, MD;  Location: [facility] OR;  Service: Orthopedics;  Laterality: Left;  . Colonoscopy      Hx:of  . Orif tibia plateau Left 11/03/2012    Dr Carola Frost  . Orif tibia plateau Left 11/03/2012    Procedure: OPEN REDUCTION INTERNAL FIXATION (ORIF) TIBIAL PLATEAU FRACTURE AND REMOVAL OF EXTERNAL FIXATION ;  Surgeon: Budd Palmer, MD;  Location: [facility] OR;  Service: Orthopedics;  Laterality: Left;  . External fixation removal Left 11/03/2012    Procedure: REMOVAL EXTERNAL FIXATION LEG;  Surgeon: Budd Palmer, MD;  Location: [facility] OR;  Service: Orthopedics;  Laterality: Left;    Subjective Symptoms/Limitations Pertinent History: Pt is a 57 year old female referred to PT for Lt tibial plateau fracture on 9/15 with a external fixator placed on 10/20/12-11/03/12 and was at the [facility] when she in the external fixator.  She had HHPT for 2 weeks (beginning of October).  She is now WBAT in her knee brace with her RW and is walking for exercise, stationary bike and has been doing some gentle ROM  activities.  Her pain is about 2-3/10.  Pain goes up to 8/10 and use medication for pain control .  She is getting better sleep now that she can sleep with a pillow between her legs.  She has a significant PMH of Bil hip replacement and Lt shoulder surgery.  Patient Stated Goals: be able walk independently Pain Assessment Currently in Pain?: Yes Pain Score: 3  Pain Orientation: Left Pain Type: Acute pain Pain Relieving Factors: hydrocodone and rabaxen 1-2x/day, ice Effect of Pain on Daily Activities: unable to walk independently  Precautions/Restrictions  Precautions Precaution Comments: WBAT Required Braces or Orthoses: Knee Immobilizer - Left Knee Immobilizer - Left: On when out of bed or walking  Balance Screening Balance Screen Has the patient fallen in the past 6 months: Yes How many times?: 1 Has the patient had a decrease in activity level because of a fear of falling? : Yes Is the patient reluctant to leave their home because of a fear of falling? : No  Prior Functioning  Prior Function Level of Independence: Independent with basic ADLs Vocation: Full time employment Vocation Requirements: HR for [employer] Comments: Enjoys reading, playing bingo, walking with her dogs has a workout room at home  Cognition/Observation Observation/Other Assessments Observations: Wearing hinge brace Other Assessments: moderate swelling to her knee  Sensation/Coordination/Flexibility/Functional Tests Coordination Gross  Motor Movements are Fluid and Coordinated: No Coordination and Movement Description: decreased to distal quadricep Functional Tests Functional Tests: FOTO: 28/72  Assessment RLE AROM (degrees) Right Knee Extension: 0 Right Knee Flexion: 130 RLE Strength Right Hip Flexion: 5/5 Right Hip Extension: 5/5 Right Hip ABduction: 5/5 Right Hip ADduction: 5/5 Right Knee Flexion: 5/5 Right Knee Extension: 5/5 LLE AROM (degrees) Left Knee Extension: 2 Left Knee  Flexion: 110 LLE Strength Left Hip Flexion: 4/5 Left Hip Extension: 4/5 Left Hip ABduction: 5/5 Left Hip ADduction: 5/5 Left Knee Flexion: 5/5 Left Knee Extension: 5/5 Palpation Palpation: maximal fascial restrictions to Lt knee inscisions  Mobility/Balance  Ambulation/Gait Ambulation/Gait: Yes Assistive device: Rolling walker Gait Pattern: Decreased stance time - left;Antalgic;Decreased hip/knee flexion - right;Right foot flat   Exercise/Treatments Standing Heel Raises: 10 reps;Limitations Heel Raises Limitations: Toe Raises: 10 reps Knee Flexion: Left;10 reps;Limitations Knee Flexion Limitations: TKF Supine Short Arc Quad Sets: Left;10 reps Bridges: 10 reps Straight Leg Raises: Left;10 reps;Limitations Straight Leg Raises Limitations: Floating Sidelying Hip ABduction: Left;10 reps Prone  Hamstring Curl: 10 reps Hip Extension: Left;10 reps  Physical Therapy Assessment and Plan PT Assessment and Plan Clinical Impression Statement: Pt is a 58 year old female referred to PT s/p Lt tibial plateau fracture on 10/19/12 with removal of external fixator on 11/03/12 with impairments listed below.  At this time pt is most limited by her fear of falling, decreased knee AROM and swelling to her knee.   Pt will benefit from skilled therapeutic intervention in order to improve on the following deficits: Pain;Decreased coordination;Decreased activity tolerance;Abnormal gait;Difficulty walking;Impaired tone;Impaired perceived functional ability;Decreased range of motion;Increased fascial restricitons Rehab Potential: Good PT Frequency: Min 2X/week PT Duration: 6 weeks PT Treatment/Interventions: DME instruction;Gait training;Stair training;Functional mobility training;Therapeutic activities;Therapeutic exercise;Balance training;Neuromuscular re-education;Patient/family education;Manual techniques;Modalities PT Plan: manual techniques to scar tissue, education on proper compression garments,  mat exercises to improve LE endurance and posture to prepare for full WB without brace.  Standing exercises with brace on when needed.      Goals Home Exercise Program Pt/caregiver will Perform Home Exercise Program: Independently PT Goal: Perform Home Exercise Program - Progress: Goal set today PT Short Term Goals Time to Complete Short Term Goals: 3 weeks PT Short Term Goal 1: Pt will improve her Lt knee AROM 0-120 degrees in order to prepare for independent gait.  PT Short Term Goal 2: Pt will present with moderate fascial restrictons throughout her LLE to reduce risk of secondary injuries.  PT Short Term Goal 3: Pt will be educated on compression garments to decrease risk of swelling.  PT Long Term Goals Time to Complete Long Term Goals:  (6 weeks) PT Long Term Goal 1: Pt will improve her LE functional strength in order to [facility] independently with mild gait impairments x10 minutes to return to functional work activities.  PT Long Term Goal 2: Pt will improve knee AROM to [facility] in order to ascend and descend 5 steps with 1 handrail in order to enter family and friends home.  Long Term Goal 3: Pt will improve her FOTO limiation to less than 48% for improved QOL.   Problem List Patient Active Problem List   Diagnosis Date Noted  . Glaucoma   . PVC (premature ventricular contraction) 10/21/2012  . Closed fracture of left tibial plateau 10/20/2012  . Fall 10/20/2012  . Obesity (BMI 30-39.9) 10/20/2012  . Preop cardiovascular exam 10/20/2012  . GERD (gastroesophageal reflux disease)   . Dyspnea 10/06/2012    PT  Plan of Care PT Home Exercise Plan: given PT Patient Instructions: importance of HEP, scar massage. Consulted and Agree with Plan of Care: Patient  GP    Annett Fabian, MPT, ATC 01/11/2013, 3:05 PM  Physician Documentation Your signature is required to indicate approval of the treatment plan as stated above.  Please sign and either send electronically or make a copy of  this report for your files and return this physician signed original.   Please mark one 1.__approve of plan  2. ___approve of plan with the following conditions.   ______________________________                                                          _____________________ Physician Signature                                                                                                             Date

## 2013-01-12 ENCOUNTER — Ambulatory Visit (HOSPITAL_COMMUNITY)
Admission: RE | Admit: 2013-01-12 | Discharge: 2013-01-12 | Disposition: A | Discharge status: Home / Self Care | Payer: 59 | Source: Ambulatory Visit | Attending: Family Medicine | Admitting: Family Medicine

## 2013-01-12 NOTE — Progress Notes (Addendum)
Physical Therapy Treatment Patient Details  Name: [name] MRN: [id number] Date of Birth: 1956-01-14  Today's Date: 01/12/2013 Time: 1017-1104 PT Time Calculation (min): 47 min Visit#: 2 of 8  Re-eval: 02/10/13 Charges:  therex 32' manual 10'  Subjective: Pt states she's been doing her HEP.  States her leg continues to swell some.  Pt states she is anxious to get walking without AD.   Exercise/Treatments Supine Short Arc Quad Sets: Left;15 reps Bridges: 15 reps Straight Leg Raises: Left;15 reps;Limitations Straight Leg Raises Limitations: Floating Sidelying Hip ABduction: Left;15 reps Prone  Hamstring Curl: 15 reps Hip Extension: Left;15 reps  Gait:  42' with hemiwalker, CGA WBAT on Lt LE  Manual Therapy Manual Therapy: Myofascial release Myofascial Release: MFR to Lt LE to promote movement of fluid beneath scar Other Manual Therapy: retro massage mid LE to thigh  Physical Therapy Assessment and Plan PT Assessment and Plan PT Assessment:  Progressed reps with mat exercises without difficulty. Progressed with gait with hemiwalker today to promote weight bearing through LT LE. Min VC's needed with gait technique.   Added manual techniques to Lt LE to decrease adhesions and promote fluid to re-route around scar.  Noted reduction in edema at end of session. PT Plan: Continue manual techniques as needed.prove LE endurance and posture to prepare for full WB without brace.   Problem List Patient Active Problem List   Diagnosis Date Noted  . Glaucoma   . PVC (premature ventricular contraction) 10/21/2012  . Closed fracture of left tibial plateau 10/20/2012  . Fall 10/20/2012  . Obesity (BMI 30-39.9) 10/20/2012  . Preop cardiovascular exam 10/20/2012  . GERD (gastroesophageal reflux disease)   . Dyspnea 10/06/2012       Lurena Nida, PTA/CLT 01/12/2013, 12:48 PM

## 2013-01-14 ENCOUNTER — Ambulatory Visit (HOSPITAL_COMMUNITY)
Admission: RE | Admit: 2013-01-14 | Discharge: 2013-01-14 | Disposition: A | Discharge status: Home / Self Care | Payer: 59 | Source: Ambulatory Visit | Attending: Family Medicine | Admitting: Family Medicine

## 2013-01-14 NOTE — Progress Notes (Addendum)
Physical Therapy Treatment Patient Details  Name: [name] MRN: [id number] Date of Birth: 1955-08-03  Today's Date: 01/14/2013 Time: 1300-1355 PT Time Calculation (min): 55 min Charge: Gait 1300-1315, TE 1315-1335, Manual 1335-1345, Ice 1345-1355  Visit#: 3 of 8  Re-eval: 02/10/13 Assessment Diagnosis: Lt tibial plateau fracture Surgical Date: 10/19/12 Next MD Visit: Dr. Carola Frost - 10/14/12  Subjective: Symptoms/Limitations Symptoms: Micah Flesher to MD yesterday, continue wearing brace until Christmas Eve Pain Assessment Currently in Pain?: Yes Pain Score: 3  Pain Location: Knee Pain Orientation: Left  Precautions/Restrictions  Precautions Precaution Comments: WBAT Required Braces or Orthoses: Knee Immobilizer - Left Knee Immobilizer - Left: On when out of bed or walking (until 01/27/2013)  Exercise/Treatments Standing Rocker Board: 2 minutes;Limitations Rocker Board Limitations: R/L Gait Training: Gait training with hemiwalker x 72' SBA Other Standing Knee Exercises: weight shifting R/L infront of mirrow  Supine Quad Sets: Left;10 reps;Limitations Quad Sets Limitations: tactile cueing to improve activation Short Arc Quad Sets: Left;15 reps Heel Slides: 10 reps Straight Leg Raises: Left;15 reps;Limitations Straight Leg Raises Limitations: Floating Prone  Hamstring Curl: 15 reps Hip Extension: Left;15 reps   Modalities Modalities: Cryotherapy Manual Therapy Manual Therapy: Myofascial release Myofascial Release: MFR to Lt LE Cryotherapy Number Minutes Cryotherapy: 10 Minutes Cryotherapy Location: Knee Type of Cryotherapy: Ice pack  Physical Therapy Assessment and Plan PT Assessment and Plan Clinical Impression Statement: Session focus on improveing weight distribution with gait mechanics and LE strengthening.  Pt educated on importance of quad strengthen and full knee extension for appropriate gait mechanics.  Manual techniques complete to reduce fascial  restrictions and patella mobs to reduce  adhesions and improve knee mobility.  Ended session with ice for pain control.   PT Plan: manual techniques to scar tissue, education on proper compression garments, mat exercises to improve LE endurance and posture to prepare for full WB without brace.  Standing exercises with brace on when needed, resume heel and toe raises and standing knee flexion next session.      Goals Home Exercise Program Pt/caregiver will Perform Home Exercise Program: Independently PT Short Term Goals Time to Complete Short Term Goals: 3 weeks PT Short Term Goal 1: Pt will improve her Lt knee AROM 0-120 degrees in order to prepare for independent gait.  PT Short Term Goal 1 - Progress: Progressing toward goal PT Short Term Goal 2: Pt will present with moderate fascial restrictons throughout her LLE to reduce risk of secondary injuries.  PT Short Term Goal 2 - Progress: Progressing toward goal PT Short Term Goal 3: Pt will be educated on compression garments to decrease risk of swelling.  PT Long Term Goals Time to Complete Long Term Goals:  (6 weeks) PT Long Term Goal 1: Pt will improve her LE functional strength in order to [facility] independently with mild gait impairments x10 minutes to return to functional work activities.  PT Long Term Goal 1 - Progress: Progressing toward goal PT Long Term Goal 2: Pt will improve knee AROM to [facility] in order to ascend and descend 5 steps with 1 handrail in order to enter family and friends home.  Long Term Goal 3: Pt will improve her FOTO limiation to less than 48% for improved QOL.   Problem List Patient Active Problem List   Diagnosis Date Noted  . Glaucoma   . PVC (premature ventricular contraction) 10/21/2012  . Closed fracture of left tibial plateau 10/20/2012  . Fall 10/20/2012  . Obesity (BMI 30-39.9) 10/20/2012  . Preop cardiovascular  exam 10/20/2012  . GERD (gastroesophageal reflux disease)   . Dyspnea 10/06/2012    PT -  End of Session Activity Tolerance: Patient tolerated treatment well General Behavior During Therapy: [facility] for tasks assessed/performed  GP    Juel Burrow 01/14/2013, 2:53 PM

## 2013-01-18 ENCOUNTER — Ambulatory Visit (HOSPITAL_COMMUNITY)
Admission: RE | Admit: 2013-01-18 | Discharge: 2013-01-18 | Disposition: A | Discharge status: Home / Self Care | Payer: 59 | Source: Ambulatory Visit | Attending: Family Medicine | Admitting: Family Medicine

## 2013-01-18 NOTE — Progress Notes (Signed)
Physical Therapy Treatment Patient Details  Name: [name] MRN: [id number] Date of Birth: 06-24-1955  Today's Date: 01/18/2013 Time: 1300-1350 PT Time Calculation (min): 50 min Visit#: 4 of 8  Re-eval: 02/10/13 Charges:  therex 1300-1328 (28'), manual 1329-1339 (10'), ice 1340-1350 (10')   Subjective: Symptoms/Limitations Symptoms: Pt states MD was pleased, gets to d/c brace 12/24.  Pt reports she is walking inside her home without brace, returned to work today with minimal pain 3/10 today.   Exercise/Treatments Standing Heel Raises: 10 reps;Limitations Heel Raises Limitations: Toe Raises: 10 reps Knee Flexion: Left;10 reps;Limitations Knee Flexion Limitations: TKF Rocker Board: 2 minutes;Limitations Rocker Board Limitations: R/L SLS: 2X15" Lt only with bilateral LE's Gait Training: Gait training with quad cane 100'' SBA Supine Quad Sets: 20 reps Short Arc Quad Sets: 20 reps Heel Slides: 10 reps Bridges: 20 reps Straight Leg Raises: Left;20 reps;Limitations Straight Leg Raises Limitations: Floating Sidelying Hip ABduction: Left;20 reps Prone  Hamstring Curl: 20 reps Hip Extension: Left;20 reps   Modalities Modalities: Cryotherapy Manual Therapy Manual Therapy: Myofascial release Myofascial Release: Lt knee to decrease adhesions f/b gentle retro massage to decrease edema Cryotherapy Number Minutes Cryotherapy: 10 Minutes Cryotherapy Location: Knee Type of Cryotherapy: Ice pack  Physical Therapy Assessment and Plan PT Assessment and Plan Clinical Impression Statement: Began gait training with quad cane.  Pt able to complete with supervision only, slower more cautious gait noted using QC.  Standing exercises completed today without brace.  Pt unable to SLS on LT without use of UE's.  Pt educated on compression garments, need for thigh high vs knee high.   PT Plan: continue to progress towards gait with LRAD, Standing exercises without brace.  Progress  standing and functional activities.       Problem List Patient Active Problem List   Diagnosis Date Noted  . Glaucoma   . PVC (premature ventricular contraction) 10/21/2012  . Closed fracture of left tibial plateau 10/20/2012  . Fall 10/20/2012  . Obesity (BMI 30-39.9) 10/20/2012  . Preop cardiovascular exam 10/20/2012  . GERD (gastroesophageal reflux disease)   . Dyspnea 10/06/2012    PT - End of Session Activity Tolerance: Patient tolerated treatment well General Behavior During Therapy: WFL for tasks assessed/performed   Lurena Nida, PTA/CLT 01/18/2013, 1:48 PM

## 2013-01-20 ENCOUNTER — Encounter (INDEPENDENT_AMBULATORY_CARE_PROVIDER_SITE_OTHER): Payer: 59

## 2013-01-20 ENCOUNTER — Encounter: Payer: Self-pay | Admitting: *Deleted

## 2013-01-20 ENCOUNTER — Ambulatory Visit (HOSPITAL_COMMUNITY): Payer: 59 | Attending: Cardiology | Admitting: Radiology

## 2013-01-20 ENCOUNTER — Encounter: Payer: Self-pay | Admitting: Cardiology

## 2013-01-20 VITALS — BP 121/86 | HR 73 | Ht 63.0 in | Wt 208.0 lb

## 2013-01-20 DIAGNOSIS — I4949 Other premature depolarization: Secondary | ICD-10-CM

## 2013-01-20 DIAGNOSIS — I493 Ventricular premature depolarization: Secondary | ICD-10-CM

## 2013-01-20 DIAGNOSIS — R0609 Other forms of dyspnea: Secondary | ICD-10-CM | POA: Insufficient documentation

## 2013-01-20 DIAGNOSIS — R0989 Other specified symptoms and signs involving the circulatory and respiratory systems: Secondary | ICD-10-CM | POA: Insufficient documentation

## 2013-01-20 DIAGNOSIS — R0602 Shortness of breath: Secondary | ICD-10-CM

## 2013-01-20 DIAGNOSIS — R9431 Abnormal electrocardiogram [ECG] [EKG]: Secondary | ICD-10-CM

## 2013-01-20 MED ORDER — REGADENOSON 0.4 MG/5ML IV SOLN
0.4000 mg | Freq: Once | INTRAVENOUS | Status: AC
  Administered 2013-01-20: 0.4 mg via INTRAVENOUS

## 2013-01-20 MED ORDER — TECHNETIUM TC 99M SESTAMIBI GENERIC - CARDIOLITE
33.0000 | Freq: Once | INTRAVENOUS | Status: AC | PRN
  Administered 2013-01-20: 33 via INTRAVENOUS

## 2013-01-20 MED ORDER — TECHNETIUM TC 99M SESTAMIBI GENERIC - CARDIOLITE
11.0000 | Freq: Once | INTRAVENOUS | Status: AC | PRN
  Administered 2013-01-20: 11 via INTRAVENOUS

## 2013-01-20 NOTE — Progress Notes (Signed)
[facility] SITE 3 NUCLEAR MED [address] [place], [place] [address] [phone]    Cardiology Nuclear Med Study  [name] is a 57 y.o. female     MRN : [id number]     DOB: 01-19-1956  Procedure Date: 01/20/2013  Nuclear Med Background Indication for Stress Test:  Evaluation for Ischemia and Abnormal EKG History:  No known CAD, Echo 2014 55-60%, Recent orhto surgery (PVC's in hospital) Cardiac Risk Factors: Family History - CAD and History of Smoking  Symptoms:  Dizziness, DOE and Palpitations   Nuclear Pre-Procedure Caffeine/Decaff Intake:  None > 12 hrs NPO After: 7:00pm   Lungs:  clear O2 Sat: 95% on room air. IV 0.9% NS with Angio Cath:  22g  IV Site: R Antecubital x 1, tolerated well IV Started by:  Irean Hong, RN  Chest Size (in):  38 Cup Size: B  Height: 5\' 3"  (1.6 m)  Weight:  208 lb (94.348 kg)  BMI:  Body mass index is 36.85 kg/(m^2). Tech Comments:  Held Lopressor this am    Nuclear Med Study 1 or 2 day study: 1 day  Stress Test Type:  Lexiscan  Reading MD: Tobias Alexander, MD  Order Authorizing Provider:  Charlton Haws, MD  Resting Radionuclide: Technetium 71m Sestamibi  Resting Radionuclide Dose: 11.0 mCi   Stress Radionuclide:  Technetium 9m Sestamibi  Stress Radionuclide Dose: 33.0 mCi           Stress Protocol Rest HR: 73 Stress HR: 108  Rest BP: 121/86 Stress BP: 139/61  Exercise Time (min): n/a METS: n/a           Dose of Adenosine (mg):  n/a Dose of Lexiscan: 0.4 mg  Dose of Atropine (mg): n/a Dose of Dobutamine: n/a mcg/kg/min (at max HR)  Stress Test Technologist: Contina Strain Chimes, BS-ES  Nuclear Technologist:  Harlow Asa, CNMT     Rest Procedure:  Myocardial perfusion imaging was performed at rest 45 minutes following the intravenous administration of Technetium 32m Sestamibi. Rest ECG: NSR with non-specific ST-T wave changes  Stress Procedure:  The patient received IV Lexiscan 0.4 mg over 15-seconds.   Technetium 9m Sestamibi injected at 30-seconds.  Quantitative spect images were obtained after a 45 minute delay.  During the infusion of Lexiscan, the patient complained of SOB and lightheadedness.  Symptoms resolved in recovery.  Stress ECG: No significant change from baseline ECG  QPS Raw Data Images:  Mild breast attenuation.  Normal left ventricular size. Stress Images:  Normal homogeneous uptake in all areas of the myocardium. Rest Images:  Normal homogeneous uptake in all areas of the myocardium. Subtraction (SDS):  No evidence of ischemia. Transient Ischemic Dilatation (Normal <1.22):  1.01 Lung/Heart Ratio (Normal <0.45):  0.38  Quantitative Gated Spect Images QGS EDV:  93 ml QGS ESV:  32 ml  Impression Exercise Capacity:  Lexiscan with no exercise. BP Response:  Normal blood pressure response. Clinical Symptoms:  No significant symptoms noted. ECG Impression:  No significant ST segment change suggestive of ischemia. Comparison with Prior Nuclear Study: No images to compare  Overall Impression:  Low risk stress nuclear study with no evidence of ischemia..  LV Ejection Fraction: 65%.  LV Wall Motion:  NL LV Function; NL Wall Motion  Tobias Alexander, Rexene Edison 01/20/2013

## 2013-01-20 NOTE — Progress Notes (Signed)
Patient ID: [name], female   DOB: 1956/02/01, 57 y.o.   MRN: [id number] E-Cardio 24 hour holter monitor applied to patient.

## 2013-01-21 ENCOUNTER — Ambulatory Visit (HOSPITAL_COMMUNITY): Payer: 59

## 2013-01-22 ENCOUNTER — Ambulatory Visit (HOSPITAL_COMMUNITY)
Admission: RE | Admit: 2013-01-22 | Discharge: 2013-01-22 | Disposition: A | Discharge status: Home / Self Care | Payer: 59 | Source: Ambulatory Visit | Attending: Family Medicine | Admitting: Family Medicine

## 2013-01-22 NOTE — Progress Notes (Signed)
Physical Therapy Treatment Patient Details  Name: [name] MRN: [id number] Date of Birth: [birth year]  Today's Date: 01/22/2013 Time: 4098-1191 PT Time Calculation (min): 50 min Charge:TE 1352-1420, Manual 1420-1430, Ice 1430-1442  Visit#: 5 of 8  Re-eval: 02/10/13 Assessment Diagnosis: Lt tibial plateau fracture Surgical Date: 10/19/12 Next MD Visit: Dr. Carola Frost - 02/10/2013  Authorization:    Authorization Time Period:    Authorization Visit#:   of     Subjective: Symptoms/Limitations Symptoms: Pt stated she feels the brace makes her limp becuase it constantly sliding down leg, reports 2 mores days of brace Pain Assessment Currently in Pain?: Yes Pain Score: 3  Pain Location: Knee Pain Orientation: Left  Precautions/Restrictions  Precautions Precaution Comments: WBAT Required Braces or Orthoses: Knee Immobilizer - Left Knee Immobilizer - Left: On when out of bed or walking  Exercise/Treatments Aerobic Stationary Bike: 8'  @ 2.0 for ROM Standing Heel Raises: 10 reps;Limitations Heel Raises Limitations: Toe Raises: 10 reps Knee Flexion: Left;15 reps;Limitations Knee Flexion Limitations: TKF 4 in step 2# Lateral Step Up: Left;10 reps;Hand Hold: 2;Step Height: 2" Functional Squat: 10 reps Rocker Board: 2 minutes;Limitations Rocker Board Limitations: R/L A/P Other Standing Knee Exercises: weight shifting R/L infront of mirrow   Modalities Modalities: Cryotherapy Manual Therapy Manual Therapy: Myofascial release Myofascial Release: Lt knee to decrease adhesions f/b gentle retro massage to decrease edema Cryotherapy Number Minutes Cryotherapy: 10 Minutes Cryotherapy Location: Knee Type of Cryotherapy: Ice pack  Physical Therapy Assessment and Plan PT Assessment and Plan Clinical Impression Statement: Began reciprocal bicycle for ROM.  Progressed standing exercises for functional strengthening with therapist facilitation required with lateral step up on 2  in.  Able to add 2# with knee flexion without difficulty.  Manual techniques complete to reduce fascial restrictions around insicion and for edema control.  Ended session with ice for pain control at end of session with LE elevated.   PT Plan: continue to progress towards gait with LRAD, Standing exercises without brace.  Progress standing and functional activities.      Goals Home Exercise Program Pt/caregiver will Perform Home Exercise Program: Independently PT Short Term Goals Time to Complete Short Term Goals: 3 weeks PT Short Term Goal 1: Pt will improve her Lt knee AROM 0-120 degrees in order to prepare for independent gait.  PT Short Term Goal 1 - Progress: Progressing toward goal PT Short Term Goal 2: Pt will present with moderate fascial restrictons throughout her LLE to reduce risk of secondary injuries.  PT Short Term Goal 2 - Progress: Progressing toward goal PT Short Term Goal 3: Pt will be educated on compression garments to decrease risk of swelling.  PT Long Term Goals Time to Complete Long Term Goals:  (6 weeks) PT Long Term Goal 1: Pt will improve her LE functional strength in order to [facility] independently with mild gait impairments x10 minutes to return to functional work activities.  PT Long Term Goal 1 - Progress: Progressing toward goal PT Long Term Goal 2: Pt will improve knee AROM to [facility] in order to ascend and descend 5 steps with 1 handrail in order to enter family and friends home.  PT Long Term Goal 2 - Progress: Progressing toward goal Long Term Goal 3: Pt will improve her FOTO limiation to less than 48% for improved QOL.   Problem List Patient Active Problem List   Diagnosis Date Noted  . Glaucoma   . PVC (premature ventricular contraction) 10/21/2012  . Closed fracture of left tibial  plateau 10/20/2012  . Fall 10/20/2012  . Obesity (BMI 30-39.9) 10/20/2012  . Preop cardiovascular exam 10/20/2012  . GERD (gastroesophageal reflux disease)   . Dyspnea  10/06/2012    PT - End of Session Activity Tolerance: Patient tolerated treatment well General Behavior During Therapy: [facility] for tasks assessed/performed  GP    Juel Burrow 01/22/2013, 5:20 PM

## 2013-01-25 ENCOUNTER — Ambulatory Visit (HOSPITAL_COMMUNITY)
Admission: RE | Admit: 2013-01-25 | Discharge: 2013-01-25 | Disposition: A | Discharge status: Home / Self Care | Payer: 59 | Source: Ambulatory Visit | Attending: Orthopedic Surgery | Admitting: Orthopedic Surgery

## 2013-01-25 NOTE — Progress Notes (Signed)
Physical Therapy Treatment Patient Details  Name: [name] MRN: [id number] Date of Birth: 11/01/1955  Today's Date: 01/25/2013 Time: 4098-1191 PT Time Calculation (min): 64 min Charges:  TE: 1435-1500 Manual: 1500-1529 Ice: 4782-9562 Visit#: 6 of 8  Re-eval: 02/10/13    Subjective: Symptoms/Limitations Symptoms: reports she is stiff.  She is excited to be able to start walking without her knee brace starting tomorrow.  she comes in with her hemiwalker today.  she feels she is ready for a SPC.  Pain Assessment Currently in Pain?: No/denies  Precautions/Restrictions     Exercise/Treatments Mobility/Balance       Aerobic Stationary Bike: 10 min 4.0 for LE strengthening and ROM Standing Lateral Step Up: Left;10 reps;Hand Hold: 2;Step Height: 2" Forward Step Up: Left;10 reps;Hand Hold: 1;Step Height: 2" Functional Squat: 10 reps;5 seconds  Manual Therapy Manual Therapy: Myofascial release Myofascial Release: Lt knee to decrease fascial restrictions thoruhgout quadricep muscle.  Educated on the stick for home self massage Cryotherapy Number Minutes Cryotherapy: 10 Minutes Cryotherapy Location: Knee Type of Cryotherapy: Ice pack  Physical Therapy Assessment and Plan PT Assessment and Plan Clinical Impression Statement: Continued with exercises  PT Plan: continue to progress towards gait with LRAD, Standing exercises without brace.  Progress standing and functional activities.      Goals    Problem List Patient Active Problem List   Diagnosis Date Noted  . Glaucoma   . PVC (premature ventricular contraction) 10/21/2012  . Closed fracture of left tibial plateau 10/20/2012  . Fall 10/20/2012  . Obesity (BMI 30-39.9) 10/20/2012  . Preop cardiovascular exam 10/20/2012  . GERD (gastroesophageal reflux disease)   . Dyspnea 10/06/2012    PT - End of Session Activity Tolerance: Patient tolerated treatment well General Behavior During Therapy: [facility] for  tasks assessed/performed  GP    Oliwia Berzins, MPT, ATC 01/25/2013, 4:04 PM

## 2013-01-26 ENCOUNTER — Ambulatory Visit (HOSPITAL_COMMUNITY)
Admission: RE | Admit: 2013-01-26 | Discharge: 2013-01-26 | Disposition: A | Discharge status: Home / Self Care | Payer: 59 | Source: Ambulatory Visit | Attending: Orthopedic Surgery | Admitting: Orthopedic Surgery

## 2013-01-26 NOTE — Progress Notes (Signed)
Physical Therapy Treatment Patient Details  Name: [name] MRN: [id number] Date of Birth: 1955/11/19  Today's Date: 01/26/2013 Time: 1425-1520 PT Time Calculation (min): 55 min Visit#: 7 of 8  Re-eval: 02/10/13 Charges:  therex 1425-1440 (15'), gait 1442-1450 (8'), manual 1451-1501 (10'), ice 1502-1512 (10')   Subjective: Symptoms/Limitations Symptoms: Pt states today she no longer has to use her brace on her Rt LE.  States she is ready.  No pain reported. Pain Assessment Currently in Pain?: No/denies   Exercise/Treatments Aerobic Stationary Bike: 10 min 5.0 for LE strengthening and ROM Standing Heel Raises: 10 reps;Limitations Heel Raises Limitations: Toe Raises: 10 reps Lateral Step Up: Left;10 reps;Hand Hold: 2;Step Height: 2" Forward Step Up: Left;10 reps;Hand Hold: 1;Step Height: 4" Functional Squat: 10 reps Rocker Board: 2 minutes;Limitations Rocker Board Limitations: R/L A/P SLS: 3X10" Lt only with 1 UE Gait Training: Gt with QC 100' X 1   Modalities Modalities: Cryotherapy Manual Therapy Manual Therapy: Myofascial release Myofascial Release: Lt knee to decrease fascial restrictions, rerouting fluid around scar. Cryotherapy Number Minutes Cryotherapy: 10 Minutes Cryotherapy Location: Knee Type of Cryotherapy: Ice pack  Physical Therapy Assessment and Plan PT Assessment and Plan Clinical Impression Statement: Continued focus on increasing weight bearing exercises and activites.  Decreased to quadcane today with gait without difficulty.  Pt able to SLS on Lt LE with 1 UE assist max of 10 seconds.   continues to have adhesions in Lt LE but able to decrease with manual techniques. PT Plan: Re-evaluate next visit.     Problem List Patient Active Problem List   Diagnosis Date Noted  . Glaucoma   . PVC (premature ventricular contraction) 10/21/2012  . Closed fracture of left tibial plateau 10/20/2012  . Fall 10/20/2012  . Obesity (BMI 30-39.9)  10/20/2012  . Preop cardiovascular exam 10/20/2012  . GERD (gastroesophageal reflux disease)   . Dyspnea 10/06/2012    PT - End of Session Activity Tolerance: Patient tolerated treatment well General Behavior During Therapy: WFL for tasks assessed/performed   Lurena Nida, PTA/CLT 01/26/2013, 3:14 PM

## 2013-02-01 ENCOUNTER — Ambulatory Visit (HOSPITAL_COMMUNITY)
Admission: RE | Admit: 2013-02-01 | Discharge: 2013-02-01 | Disposition: A | Discharge status: Home / Self Care | Payer: 59 | Source: Ambulatory Visit | Attending: Orthopedic Surgery | Admitting: Orthopedic Surgery

## 2013-02-01 NOTE — Progress Notes (Signed)
Physical Therapy Treatment Patient Details  Name: [name] MRN: [id number] Date of Birth: [birth year]  Today's Date: 02/01/2013 Time: 4098-1191 PT Time Calculation (min): 46 min Charge: TE 1432-1440, MMT/ROM measurement 1440-1448, Manual 1448-1500, Gait 1500-1518  Visit#: 8 of 16  Re-eval: 03/01/12 Assessment Diagnosis: Lt tibial plateau fracture Surgical Date: 10/19/12 Next MD Visit: Dr. Carola Frost - 02/10/2013  Subjective: Symptoms/Limitations Symptoms: Pt stated knee is stiff morning times but has been getting up and looses though day.   How long can you sit comfortably?: Able to sit comfortably for one hour without propping up foot How long can you stand comfortably?: Able to stand for 5 minutes How long can you walk comfortably?: Able to walk comfortably for 10 minutes with hemiwalker, has been practicing without AD inside house. Pain Assessment Currently in Pain?: Yes Pain Score: 1   Precautions/Restrictions  Precautions Precaution Comments: WBAT  Exercise/Treatments Aerobic Stationary Bike: 8' @ 3.0 for strengthening and ROM Standing Rocker Board: 2 minutes;Limitations Rocker Board Limitations: R/L A/P Gait Training: Gt with SPC x 258', no AD x 124 Other Standing Knee Exercises: weight shifting R/L infront of mirrow  Supine Heel Slides: 15 reps;Limitations Heel Slides Limitations: prior ROM measurement  Manual Therapy Manual Therapy: Myofascial release Myofascial Release: Lt knee to decrease fascial restrictions, rerouting fluid around scar  Physical Therapy Assessment and Plan PT Assessment and Plan Clinical Impression Statement: Re-eval complete with the following findings:  [name] has had 8 OPPT sessions over 3 weeks.  independent with HEP and able to demonstrate appropriate technique with all exercises.  Pt has met 2/3 STGs and progressing well towards LTGs.  Improved AROM to 0-120, increased strength for all musculature.  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  Pt has been educated on benefits of compression hose and has purchased them.  Pt with increased strengthen with increase ease with functional tasks.   PT Plan: Recommed continuing OPPT for 4 more weeks to improve functional strengthening with stairs, improve balance to reduce risk of falls and progress to ambulating independently with no AD.    Goals Home Exercise Program Pt/caregiver will Perform Home Exercise Program: Independently PT Goal: Perform Home Exercise Program - Progress: Met (twice a day) PT Short Term Goals Time to Complete Short Term Goals: 3 weeks PT Short Term Goal 1: Pt will improve her Lt knee AROM 0-120 degrees in order to prepare for independent gait.  PT Short Term Goal 1 - Progress: Met (ambulates with hemiwalke/SPC AROM 1-120) PT Short Term Goal 2: Pt will present with moderate fascial restrictons throughout her LLE to reduce risk of secondary injuries.  PT Short Term Goal 2 - Progress: Progressing toward goal PT Short Term Goal 3: Pt will be educated on compression garments to decrease risk of swelling.  PT Short Term Goal 3 - Progress: Met PT Long Term Goals Time to Complete Long Term Goals:  (6 weeks) PT Long Term Goal 1: Pt will improve her LE functional strength in order to [facility] independently with mild gait impairments x10 minutes to return to functional work activities.  PT Long Term Goal 1 - Progress: Progressing toward goal PT Long Term Goal 2: Pt will improve knee AROM to [facility] in order to ascend and descend 5 steps with 1 handrail in order to enter family and friends home.  PT Long Term Goal 2 - Progress: Partly met (AROM 0-120) Long Term  Goal 3: Pt will improve her FOTO limiation to less than 48% for improved QOL.  Long Term Goal 3 Progress: Progressing toward goal (Status 45%, limitation 55%)  Problem  List Patient Active Problem List   Diagnosis Date Noted  . Glaucoma   . PVC (premature ventricular contraction) 10/21/2012  . Closed fracture of left tibial plateau 10/20/2012  . Fall 10/20/2012  . Obesity (BMI 30-39.9) 10/20/2012  . Preop cardiovascular exam 10/20/2012  . GERD (gastroesophageal reflux disease)   . Dyspnea 10/06/2012    PT - End of Session Activity Tolerance: Patient tolerated treatment well General Behavior During Therapy: [facility] for tasks assessed/performed  GP    Juel Burrow 02/01/2013, 5:39 PM

## 2013-02-01 NOTE — Progress Notes (Addendum)
Physical Therapy Re-evaluation/Treatment  Patient Details  Name: [name] MRN: [id number] Date of Birth: May 23, 1955  Today's Date: 02/01/2013 Time: 4098-1191 PT Time Calculation (min): 46 min Charge: TE 1432-1440, MMT/ROM measurement 1440-1448, Manual 1448-1500, Gait [phone]              Visit#: 8 of 16  Re-eval: 03/01/12    Subjective Symptoms/Limitations Symptoms: Pt stated knee is stiff morning times but has been getting up and looses though day.   How long can you sit comfortably?: Able to sit comfortably for one hour without propping up foot How long can you stand comfortably?: Able to stand for 5 minutes How long can you walk comfortably?: Able to walk comfortably for 10 minutes with hemiwalker, has been practicing without AD inside house. Pain Assessment Currently in Pain?: Yes Pain Score: 1   Precautions/Restrictions  Precautions Precaution Comments: WBAT   02/01/13 1400  Assessment  Diagnosis Lt tibial plateau fracture  Surgical Date 10/19/12  Next MD Visit Dr. Carola Frost - 02/10/2013  Precautions  Precaution Comments WBAT  Functional Tests  Functional Tests FOTO: 45/55 (FOTO: 28/72)  LLE AROM (degrees)  Left Knee Extension 0 (was 2)  Left Knee Flexion 120 (was 110)  LLE Strength  Left Hip Flexion 5/5 (was 4/5)  Left Hip Extension (4+/5 was 4/5)    Exercise/Treatments Aerobic Stationary Bike: 8' @ 3.0 for strengthening and ROM Standing Rocker Board: 2 minutes;Limitations Rocker Board Limitations: R/L A/P Gait Training: Gt with SPC x 258', no AD x 124 Other Standing Knee Exercises: weight shifting R/L infront of mirrow  Supine Heel Slides: 15 reps;Limitations Heel Slides Limitations: prior ROM measurement   Manual Therapy Manual Therapy: Myofascial release Myofascial Release: Lt knee to decrease fascial restrictions, rerouting fluid around scar  Physical Therapy Assessment and Plan PT Assessment and Plan Clinical Impression Statement:  Re-eval complete with the following findings:  [name] has had 8 OPPT sessions over 3 weeks.  independent with HEP and able to demonstrate appropriate technique with all exercises.  Pt has met 2/3 STGs and progressing well towards LTGs.  Improved AROM to 0-120, increased strength for all musculature.  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  Pt has been educated on benefits of compression hose and has purchased them.  Pt with increased strengthen with increase ease with functional tasks.   PT Plan: Recommed continuing OPPT for 4 more weeks to improve functional strengthening with stairs, improve balance to reduce risk of falls and progress to ambulating independently with no AD.    Goals Home Exercise Program Pt/caregiver will Perform Home Exercise Program: Independently PT Goal: Perform Home Exercise Program - Progress: Met (twice a day) PT Short Term Goals Time to Complete Short Term Goals: 3 weeks PT Short Term Goal 1: Pt will improve her Lt knee AROM 0-120 degrees in order to prepare for independent gait.  PT Short Term Goal 1 - Progress: Met (ambulates with hemiwalke/SPC AROM 1-120) PT Short Term Goal 2: Pt will present with moderate fascial restrictons throughout her LLE to reduce risk of secondary injuries.  PT Short Term Goal 2 - Progress: Progressing toward goal PT Short Term Goal 3: Pt will be educated on compression garments to decrease risk of swelling.  PT Short Term Goal 3 - Progress: Met PT Long Term Goals Time to Complete Long Term Goals:  (6 weeks) PT Long Term Goal 1: Pt will improve  her LE functional strength in order to [facility] independently with mild gait impairments x10 minutes to return to functional work activities.  PT Long Term Goal 1 - Progress: Progressing toward goal PT Long Term Goal 2: Pt will improve knee AROM to [facility] in order to ascend and  descend 5 steps with 1 handrail in order to enter family and friends home.  PT Long Term Goal 2 - Progress: Partly met (AROM 0-120) Long Term Goal 3: Pt will improve her FOTO limiation to less than 48% for improved QOL.  Long Term Goal 3 Progress: Progressing toward goal (Status 45%, limitation 55%)  Problem List Patient Active Problem List   Diagnosis Date Noted  . Glaucoma   . PVC (premature ventricular contraction) 10/21/2012  . Closed fracture of left tibial plateau 10/20/2012  . Fall 10/20/2012  . Obesity (BMI 30-39.9) 10/20/2012  . Preop cardiovascular exam 10/20/2012  . GERD (gastroesophageal reflux disease)   . Dyspnea 10/06/2012    PT - End of Session Activity Tolerance: Patient tolerated treatment well General Behavior During Therapy: [facility] for tasks assessed/performed  GP    Juel Burrow; Annett Fabian, MPT, ATC 02/01/2013, 6:16 PM  Physician Documentation Your signature is required to indicate approval of the treatment plan as stated above.  Please sign and either send electronically or make a copy of this report for your files and return this physician signed original.   Please mark one 1.__approve of plan  2. ___approve of plan with the following conditions.   ______________________________                                                          _____________________ Physician Signature                                                                                                             Date

## 2013-02-03 ENCOUNTER — Ambulatory Visit (HOSPITAL_COMMUNITY): Payer: 59

## 2013-02-03 ENCOUNTER — Telehealth (HOSPITAL_COMMUNITY): Payer: Self-pay

## 2013-02-05 ENCOUNTER — Other Ambulatory Visit: Payer: Self-pay | Admitting: Family Medicine

## 2013-02-09 ENCOUNTER — Ambulatory Visit (HOSPITAL_COMMUNITY)
Admission: RE | Admit: 2013-02-09 | Discharge: 2013-02-09 | Disposition: A | Discharge status: Home / Self Care | Payer: 59 | Source: Ambulatory Visit | Attending: Family Medicine | Admitting: Family Medicine

## 2013-02-09 DIAGNOSIS — M25569 Pain in unspecified knee: Secondary | ICD-10-CM | POA: Insufficient documentation

## 2013-02-09 DIAGNOSIS — M6281 Muscle weakness (generalized): Secondary | ICD-10-CM | POA: Insufficient documentation

## 2013-02-09 DIAGNOSIS — IMO0001 Reserved for inherently not codable concepts without codable children: Secondary | ICD-10-CM | POA: Insufficient documentation

## 2013-02-09 DIAGNOSIS — M79609 Pain in unspecified limb: Secondary | ICD-10-CM | POA: Insufficient documentation

## 2013-02-09 DIAGNOSIS — R269 Unspecified abnormalities of gait and mobility: Secondary | ICD-10-CM | POA: Insufficient documentation

## 2013-02-09 NOTE — Progress Notes (Signed)
Physical Therapy Treatment Patient Details  Name: [name] MRN: [id number] Date of Birth: 1955/12/10  Today's Date: 02/09/2013 Time: 3474-2595 PT Time Calculation (min): 50 min Charge: Gait 1345-1353, TE 1353-1415, Manual 1415-1425, Ice 1425-1435  Visit#: 9 of 16  Re-eval: 03/01/12 Assessment Diagnosis: Lt tibial plateau fracture Surgical Date: 10/19/12 Next MD Visit: Dr. Marcelino Scot - 02/10/2013  Subjective: Symptoms/Limitations Symptoms: Pt reported injuring back while tying shoes on new years day reports knee is doing good most concerned with the swelling restricting the flexion.  States stairs are difficult.   Pain Assessment Currently in Pain?: Yes Pain Score: 1  Pain Location: Knee Pain Orientation: Left Multiple Pain Sites: Yes  Precautions/Restrictions  Precautions Precaution Comments: WBAT  Exercise/Treatments Aerobic Stationary Bike: 8' @ 3.5 for strengthening and ROM seat 8 Standing Knee Flexion: Left;15 reps;Limitations Knee Flexion Limitations: 3# TKF 2in Lateral Step Up: Left;Hand Hold: 2;Step Height: 2";15 reps Forward Step Up: Left;Hand Hold: 1;Step Height: 4";15 reps Step Down: Left;10 reps;Hand Hold: 1;Step Height: 2" Functional Squat: 20 reps Rocker Board: 2 minutes;Limitations Rocker Board Limitations: R/L A/P Gait Training: Gait training no AD x 538 focus on equal stance phase   Modalities Modalities: Cryotherapy Manual Therapy Manual Therapy: Myofascial release Myofascial Release: Lt knee to decrease fascial restrictions, rerouting fluid around scartaken  Cryotherapy Number Minutes Cryotherapy: 10 Minutes Cryotherapy Location: Knee Type of Cryotherapy: Ice pack  Physical Therapy Assessment and Plan PT Assessment and Plan Clinical Impression Statement: Continued gait training with no AD with cueing for equalized stance phase.  Progressed functional strengthening, began step down and added 3# with standing knee flexion TKF.  Continues  with manual techniques to reduce fascial restrictins with LE elevated to assist with edema control.  Ended with ice for pain and edema control.  Reviwed benefits of compression hose, pt plans to discuss with MD tomorrow.  Pt given printout of re-eval done last session to take to MD.   PT Plan: Continue with current POC for functional strengthening and gait training.  Continue with stair training and progress to improve balance to reduce risk of falls.  Begin cybex weight training when able.      Goals    Problem List Patient Active Problem List   Diagnosis Date Noted  . Glaucoma   . PVC (premature ventricular contraction) 10/21/2012  . Closed fracture of left tibial plateau 10/20/2012  . Fall 10/20/2012  . Obesity (BMI 30-39.9) 10/20/2012  . Preop cardiovascular exam 10/20/2012  . GERD (gastroesophageal reflux disease)   . Dyspnea 10/06/2012    PT - End of Session Activity Tolerance: Patient tolerated treatment well General Behavior During Therapy: [facility] for tasks assessed/performed  GP    Aldona Lento 02/09/2013, 3:08 PM

## 2013-02-11 ENCOUNTER — Telehealth: Payer: Self-pay | Admitting: *Deleted

## 2013-02-11 NOTE — Telephone Encounter (Signed)
PT'S  HUSBAND  AWARE OF MONITOR RESULTS  PER  DR NISHAN   PVC'S NORMAL NUCLEAR OK./CY

## 2013-02-12 ENCOUNTER — Ambulatory Visit (HOSPITAL_COMMUNITY)
Admission: RE | Admit: 2013-02-12 | Discharge: 2013-02-12 | Disposition: A | Discharge status: Home / Self Care | Payer: 59 | Source: Ambulatory Visit | Attending: Family Medicine | Admitting: Family Medicine

## 2013-02-12 NOTE — Progress Notes (Signed)
Physical Therapy Treatment Patient Details  Name: DECARLA SIEMEN MRN: [id number] Date of Birth: 11-13-1955  Today's Date: 02/12/2013 Time: 1962-2297 PT Time Calculation (min): 47 min Charge: TE 1345-1420, Manual [id number]  Visit#: 10 of 16  Re-eval: 03/01/12    Subjective: Symptoms/Limitations Symptoms: Martin Majestic to MD, happy with progress.  Pt given prescription for thigh high 20-30 mmHg compression hose and given address for fitting in [place].  Pain free today.  Pt reported decrease in  medication. Pain Assessment Currently in Pain?: No/denies  Objective:  Exercise/Treatments Aerobic Stationary Bike: 8' @ 4.0 for strengthening and ROM seat 7 Standing Heel Raises: Limitations Heel Raises Limitations: heel and toe walking 2RT Lateral Step Up: Left;Hand Hold: 2;Step Height: 2";15 reps Forward Step Up: Left;Hand Hold: 1;Step Height: 4";15 reps Step Down: Left;10 reps;Hand Hold: 1;Step Height: 2" Rocker Board: 2 minutes;Limitations Rocker Board Limitations: R/L A/P Gait Training: Gait training no AD x 538 focus on equal stance phase  Manual Therapy Manual Therapy: Myofascial release Myofascial Release: Lt knee to decrease fascial restrictions, rerouting fluid around scar  Physical Therapy Assessment and Plan PT Assessment and Plan Clinical Impression Statement: Continued functional strenghtneing exercises to improve gait mechanics and increase ease with tasks like steps at home.  Added heel and toe walking for LE strengthening, balance and gait mechanics.  Pt did c/o Lt foot pain during standing activities.  Continued with manual techniques to reduce fascial restrictions perimeter of scar and to reduce swelling with LE elevated.  Pt plans to apply ice at home following treatment and plans to get the thigh high comporession hose next week.   PT Plan: Continue with current POC for functional strengthening and gait training.  Continue with stair training and progress to improve  balance to reduce risk of falls.  Begin cybex weight training when able.  Begin Nustep next session for LE strengthening    Goals Home Exercise Program Pt/caregiver will Perform Home Exercise Program: Independently PT Short Term Goals Time to Complete Short Term Goals: 3 weeks PT Short Term Goal 1: Pt will improve her Lt knee AROM 0-120 degrees in order to prepare for independent gait.  PT Short Term Goal 2: Pt will present with moderate fascial restrictons throughout her LLE to reduce risk of secondary injuries.  PT Short Term Goal 2 - Progress: Progressing toward goal PT Short Term Goal 3: Pt will be educated on compression garments to decrease risk of swelling.  PT Long Term Goals Time to Complete Long Term Goals:  (6 weeks) PT Long Term Goal 1: Pt will improve her LE functional strength in order to [facility] independently with mild gait impairments x10 minutes to return to functional work activities.  PT Long Term Goal 1 - Progress: Progressing toward goal PT Long Term Goal 2: Pt will improve knee AROM to [facility] in order to ascend and descend 5 steps with 1 handrail in order to enter family and friends home.  Long Term Goal 3: Pt will improve her FOTO limiation to less than 48% for improved QOL.   Problem List Patient Active Problem List   Diagnosis Date Noted  . Glaucoma   . PVC (premature ventricular contraction) 10/21/2012  . Closed fracture of left tibial plateau 10/20/2012  . Fall 10/20/2012  . Obesity (BMI 30-39.9) 10/20/2012  . Preop cardiovascular exam 10/20/2012  . GERD (gastroesophageal reflux disease)   . Dyspnea 10/06/2012    PT - End of Session Activity Tolerance: Patient tolerated treatment well General Behavior During Therapy: [facility]  for tasks assessed/performed  GP    Aldona Lento 02/12/2013, 3:01 PM

## 2013-02-15 ENCOUNTER — Ambulatory Visit (HOSPITAL_COMMUNITY)
Admission: RE | Admit: 2013-02-15 | Discharge: 2013-02-15 | Disposition: A | Discharge status: Home / Self Care | Payer: 59 | Source: Ambulatory Visit | Attending: Family Medicine | Admitting: Family Medicine

## 2013-02-15 NOTE — Progress Notes (Signed)
Physical Therapy Treatment Patient Details  Name: [name] MRN: [id number] Date of Birth: 06/15/1955  Today's Date: 02/15/2013 Time: 1345-1440 PT Time Calculation (min): 55 min Charge ;TE 1345-1430, Ice 1430-1440  Visit#: 11 of 16  Re-eval: 03/01/12    Subjective: Symptoms/Limitations Symptoms: Pt stated she went walking in mall over weekend without SPC, did push on cart.  Able to walk for 15 minutes.. Pt feels the cold weather giving her increased pain today.   Pain Assessment Currently in Pain?: Yes Pain Score: 3  Pain Location: Knee Pain Orientation: Left  Objective:   Exercise/Treatments Aerobic Elliptical: Nustep for LE strengthening hill level 3, resistance 3 SPM 95 Machines for Strengthening Cybex Knee Extension: 1Pl Lt LE only 15x Cybex Leg Press: 4Pl Bil LE 15x Standing Heel Raises: Limitations Heel Raises Limitations: heel and toe walking 2RT Lateral Step Up: Left;Hand Hold: 2;Step Height: 2";15 reps Forward Step Up: Left;Hand Hold: 1;Step Height: 4";15 reps Step Down: Left;10 reps;Hand Hold: 1;Step Height: 2" Rocker Board: 2 minutes;Limitations Rocker Board Limitations: R/L A/P  Modalities Modalities: Cryotherapy Cryotherapy Number Minutes Cryotherapy: 10 Minutes Cryotherapy Location: Knee Type of Cryotherapy: Ice pack  Physical Therapy Assessment and Plan PT Assessment and Plan Clinical Impression Statement: Progressed functional strengthening exercises, began Nustep for strengthening and activtiy tolerance and began cybex weight machines.  Ice applied at end of session for pain and edema control.  Noted improved gait mechancis.  Pt plans to buy compression hose on Thusday.   PT Plan: Continue with current POC for functional strengthening and gait training.  Continue with stair training and progress to improve balance to reduce risk of falls.      Goals Home Exercise Program Pt/caregiver will Perform Home Exercise Program: Independently PT  Short Term Goals Time to Complete Short Term Goals: 3 weeks PT Short Term Goal 1: Pt will improve her Lt knee AROM 0-120 degrees in order to prepare for independent gait.  PT Short Term Goal 2: Pt will present with moderate fascial restrictons throughout her LLE to reduce risk of secondary injuries.  PT Short Term Goal 3: Pt will be educated on compression garments to decrease risk of swelling.  PT Long Term Goals Time to Complete Long Term Goals:  (6 weeks) PT Long Term Goal 1: Pt will improve her LE functional strength in order to [facility] independently with mild gait impairments x10 minutes to return to functional work activities.  PT Long Term Goal 1 - Progress: Progressing toward goal PT Long Term Goal 2: Pt will improve knee AROM to [facility] in order to ascend and descend 5 steps with 1 handrail in order to enter family and friends home.  PT Long Term Goal 2 - Progress: Progressing toward goal Long Term Goal 3: Pt will improve her FOTO limiation to less than 48% for improved QOL.   Problem List Patient Active Problem List   Diagnosis Date Noted  . Glaucoma   . PVC (premature ventricular contraction) 10/21/2012  . Closed fracture of left tibial plateau 10/20/2012  . Fall 10/20/2012  . Obesity (BMI 30-39.9) 10/20/2012  . Preop cardiovascular exam 10/20/2012  . GERD (gastroesophageal reflux disease)   . Dyspnea 10/06/2012    PT - End of Session Activity Tolerance: Patient tolerated treatment well General Behavior During Therapy: [facility] for tasks assessed/performed  GP    Aldona Lento 02/15/2013, 3:05 PM

## 2013-02-17 ENCOUNTER — Ambulatory Visit (HOSPITAL_COMMUNITY)
Admission: RE | Admit: 2013-02-17 | Discharge: 2013-02-17 | Disposition: A | Discharge status: Home / Self Care | Payer: 59 | Source: Ambulatory Visit | Attending: Family Medicine | Admitting: Family Medicine

## 2013-02-17 NOTE — Progress Notes (Addendum)
Physical Therapy Treatment Patient Details  Name: [name] MRN: [id number] Date of Birth: 05-02-1955  Today's Date: 02/17/2013 Time: 8099-8338 PT Time Calculation (min): 79 min Charge: TE 2505-3976, Gait training [phone], Manual 1415-1426  Visit#: 12 of 16  Re-eval: 03/01/12 Assessment Diagnosis: Lt tibial plateau fracture Surgical Date: 10/19/12 Next MD Visit: Dr. Marcelino Scot - 03/24/2013  Subjective: Symptoms/Limitations Symptoms: Pt entered dept with increased antalgic gait, reported she started to call out of work this morning but was able to go into work following ice on knee.  Pain scale 2-3/10.  Pt beleives cold weather increased her pain today. Pain Assessment Currently in Pain?: Yes Pain Score: 3  Pain Location: Knee Pain Orientation: Left  Precautions/Restrictions  Precautions Precaution Comments: WBAT  Exercise/Treatments Aerobic Elliptical: Nustep for LE strengthening hill level 3, resistance 3 SPM104 Tread Mill: Gait training @ 0.60 cyc/sec with visual cueing, Leg length at 78 Standing Lateral Step Up: Left;Hand Hold: 2;Step Height: 2";15 reps Forward Step Up: Left;Hand Hold: 1;Step Height: 4";15 reps Step Down: Left;15 reps;Hand Hold: 1;Step Height: 4" Rocker Board: 2 minutes;Limitations;1 minute Rocker Board Limitations: R/L A/P, 1 min balance SLS: 4" max of 5 Gait Training: Gait training no AD x 538 focus on equal stance phase   Manual Therapy Manual Therapy: Edema management Edema Management: Retro massage with MFR to decrease fascial restrictions with LE elevated   Physical Therapy Assessment and Plan PT Assessment and Plan Clinical Impression Statement: Session focus on improving gait mechanics to reduce antalgic gait pattern and functional strengthening.  Began gait training on TM with visual cueing to improve mechanics.    Able to increase height to 4in with step down this session.  Manual techniques completed to reduce fascial restrictions  and edema.  Pt stated pain scale 1/10 at end of session with improved gait mechanics noted. PT Plan: Continue with current POC for functional strengthening and gait training.  Continue with stair training and progress to improve balance to reduce risk of falls.      Goals Home Exercise Program Pt/caregiver will Perform Home Exercise Program: Independently PT Short Term Goals Time to Complete Short Term Goals: 3 weeks PT Short Term Goal 1: Pt will improve her Lt knee AROM 0-120 degrees in order to prepare for independent gait.  PT Short Term Goal 2: Pt will present with moderate fascial restrictons throughout her LLE to reduce risk of secondary injuries.  PT Short Term Goal 2 - Progress: Progressing toward goal PT Short Term Goal 3: Pt will be educated on compression garments to decrease risk of swelling.  PT Long Term Goals Time to Complete Long Term Goals:  (6 weeks) PT Long Term Goal 1: Pt will improve her LE functional strength in order to [facility] independently with mild gait impairments x10 minutes to return to functional work activities.  PT Long Term Goal 1 - Progress: Progressing toward goal PT Long Term Goal 2: Pt will improve knee AROM to [facility] in order to ascend and descend 5 steps with 1 handrail in order to enter family and friends home.  Long Term Goal 3: Pt will improve her FOTO limiation to less than 48% for improved QOL.   Problem List Patient Active Problem List   Diagnosis Date Noted  . Glaucoma   . PVC (premature ventricular contraction) 10/21/2012  . Closed fracture of left tibial plateau 10/20/2012  . Fall 10/20/2012  . Obesity (BMI 30-39.9) 10/20/2012  . Preop cardiovascular exam 10/20/2012  . GERD (gastroesophageal reflux disease)   .  Dyspnea 10/06/2012    PT - End of Session Activity Tolerance: Patient tolerated treatment well General Behavior During Therapy: [facility] for tasks assessed/performed  GP    Aldona Lento 02/17/2013, 2:36 PM

## 2013-02-22 ENCOUNTER — Ambulatory Visit (HOSPITAL_COMMUNITY): Payer: 59

## 2013-02-22 ENCOUNTER — Telehealth (HOSPITAL_COMMUNITY): Payer: Self-pay

## 2013-02-24 ENCOUNTER — Ambulatory Visit (HOSPITAL_COMMUNITY)
Admission: RE | Admit: 2013-02-24 | Discharge: 2013-02-24 | Disposition: A | Discharge status: Home / Self Care | Payer: 59 | Source: Ambulatory Visit | Attending: Family Medicine | Admitting: Family Medicine

## 2013-02-24 NOTE — Progress Notes (Signed)
Physical Therapy Treatment Patient Details  Name: [name] MRN: [id number] Date of Birth: 1955/05/12  Today's Date: 02/24/2013 Time: 1345-1430 PT Time Calculation (min): 45 min 1345-1405 there exercise  1405-1430 neuromuscular re education  Visit#: 13 of 16  Re-eval: 03/01/12 Assessment Diagnosis: Lt tibial plateau fracture Surgical Date: 10/19/12 Next MD Visit: Dr. Marcelino Scot - 03/24/2013  Authorization:    Authorization Time Period:    Authorization Visit#:   of     Subjective: Symptoms/Limitations Symptoms: feels like she is improving, late in evenings hardest time,  Pain Assessment Pain Score: 2  Pain Location: Knee Pain Orientation: Left  Precautions/Restrictions  Precautions Precaution Comments: WBAT  Exercise/Treatments Mobility/Balance        Stretches   Aerobic Elliptical: Nustep L3 8 min - ROM/strength/endurance  Machines for Strengthening      Standing Heel Raises: 20 reps Lateral Step Up: Left;20 reps;Hand Hold: 1;Step Height: 4" Forward Step Up: Left;20 reps;Hand Hold: 1;Step Height: 4" Step Down: Left;10 reps;Hand Hold: 1;Step Height: 2"            Balance Exercises/ NMR  Standing Tandem Gait: Forward;2 reps Retro Gait: 2 reps Sidestepping: 2 reps Step Over Hurdles / Cones: 3 low hurdles forward 4 X  Other Standing Exercises: step forward with overhead reach  15 x each leg  Other Standing Exercises: stepping around cones 3 min, 6 inch alterantion stair taps 20x    forward and back vectors with left leg on balance pad 10 x each direction  SLS  Left  Leg  3 trials  - max was 8 seconds           Physical Therapy Assessment and Plan PT Assessment and Plan Clinical Impression Statement: hand use required for step up higher than or equal to 4 inches per LE weakness, improving functional stability without cane, safely walking around floor obstacles no device at slow speeds , can benefit from speed related tasks and  continue  functional strenghtening and balance exercises  PT Plan: Continue with current POC for functional strengthening and gait training.  Continue with stair training and progress to improve balance to reduce risk of falls.      Goals Home Exercise Program Pt/caregiver will Perform Home Exercise Program: Independently PT Short Term Goals Time to Complete Short Term Goals: 3 weeks PT Short Term Goal 1: Pt will improve her Lt knee AROM 0-120 degrees in order to prepare for independent gait.  PT Short Term Goal 2: Pt will present with moderate fascial restrictons throughout her LLE to reduce risk of secondary injuries.  PT Short Term Goal 3: Pt will be educated on compression garments to decrease risk of swelling.  PT Long Term Goals Time to Complete Long Term Goals:  (6 weeks) PT Long Term Goal 1: Pt will improve her LE functional strength in order to [facility] independently with mild gait impairments x10 minutes to return to functional work activities.  PT Long Term Goal 1 - Progress: Progressing toward goal PT Long Term Goal 2: Pt will improve knee AROM to [facility] in order to ascend and descend 5 steps with 1 handrail in order to enter family and friends home.  PT Long Term Goal 2 - Progress: Progressing toward goal Long Term Goal 3: Pt will improve her FOTO limiation to less than 48% for improved QOL.   Problem List   PT - End of Session Activity Tolerance: Patient tolerated treatment well General Behavior During Therapy: [facility] for tasks assessed/performed  GP  [name] 02/24/2013, 3:44 PM

## 2013-03-01 ENCOUNTER — Ambulatory Visit (HOSPITAL_COMMUNITY)
Admission: RE | Admit: 2013-03-01 | Discharge: 2013-03-01 | Disposition: A | Discharge status: Home / Self Care | Payer: 59 | Source: Ambulatory Visit | Attending: Family Medicine | Admitting: Family Medicine

## 2013-03-01 NOTE — Progress Notes (Addendum)
Physical Therapy Treatment/Re-assessment Patient Details  Name: [name] MRN: [id number] Date of Birth: 02-15-1955  Today's Date: 03/01/2013 Time: 1350-1430 PT Time Calculation (min): 40 min Charge: MMT/ROM Measurement 1350-1400, TE 3825-0539  Visit#: 45 of 26  Re-eval: 03/15/13 Assessment Diagnosis: Lt tibial plateau fracture Surgical Date: 10/19/12 Next MD Visit: Dr. Marcelino Scot - 03/24/2013  Subjective: Symptoms/Limitations Symptoms: Pt reports getting compression hose, reports stairs are most difficult functional  How long can you sit comfortably?: Able to ride in vehicle to and from work 45 min ride, able to sit comfortably for one hou (was Able to sit comfortably for one hour without propping up foot) How long can you stand comfortably?: Able to stand for 10 minutes, able to prepare meal without rest breaks (was Able to stand for 5 minutes) How long can you walk comfortably?: Able to walk comforably for 10 minutes without AD, able to walk with stroller at grocery store for 20 minutes (was Able to walk comfortably for 10 minutes with hemiwalker, has been practicing without AD inside house.) Pain Assessment Currently in Pain?: Yes Pain Score: 1  Pain Location: Knee Pain Orientation: Left  Precautions/Restrictions  Precautions Precaution Comments: WBAT  Objective:   03/01/13 1300  Assessment  Diagnosis Lt tibial plateau fracture  Surgical Date 10/19/12  Next MD Visit Dr. Marcelino Scot - 03/24/2013  Precautions  Precaution Comments WBAT  LLE AROM (degrees)  Left Knee Extension 0  Left Knee Flexion 120  LLE Strength  Left Hip Extension 5/5 (was 4+/5 )     Exercise/Treatments Standing Lateral Step Up: Left;15 reps;Limitations Lateral Step Up Limitations: stairwell height 6 in Forward Step Up: Left;Limitations;15 reps;Hand Hold: 2 Forward Step Up Limitations: stairwell height 6 in Step Down: 15 reps;Hand Hold: 1;Limitations Step Down Limitations: stairwell height 6  in Rocker Board: 2 minutes;Limitations;1 minute Rocker Board Limitations: R/L A/P, 1 min balance SLS: 7" max of 5 Gait Training: Gait training no AD x 538 focus on equal stance phase Supine Heel Slides: 10 reps;Limitations Heel Slides Limitations: prior ROM measurement     Physical Therapy Assessment and Plan PT Assessment and Plan Clinical Impression Statement: Reassessment complete with the following findings:  Pt independent wtih HEP daily and able to demonstrate appropriate technique with all exercises.  AROM improved to 0-124, strength 5/5 all musculature.  Pt continues to demonstrate mild to mioderate gait inpairments with no AD, difficulty with balance activities  and demonstrates weak eccentric control with stair training.  Recommend contiinuing OPPT for 2 more weeks to address gait and stair deficitis to reduce risk of further injury PT Plan: Recommend continuing OPPT for 2 more weeks for 2 weeks  to improve gait, balance and stair training.      Goals Home Exercise Program Pt/caregiver will Perform Home Exercise Program: Independently PT Short Term Goals Time to Complete Short Term Goals: 3 weeks PT Short Term Goal 1: Pt will improve her Lt knee AROM 0-120 degrees in order to prepare for independent gait.  PT Short Term Goal 1 - Progress: Met PT Short Term Goal 2: Pt will present with moderate fascial restrictons throughout her LLE to reduce risk of secondary injuries.  PT Short Term Goal 2 - Progress: Met PT Short Term Goal 3: Pt will be educated on compression garments to decrease risk of swelling.  PT Short Term Goal 3 - Progress: Met PT Long Term Goals Time to Complete Long Term Goals:  (6 weeks) PT Long Term Goal 1: Pt will improve her LE functional  strength in order to [facility] independently with mild gait impairments x10 minutes to return to functional work activities.  PT Long Term Goal 1 - Progress: Partly met (Continues to demonstrate mild-mod gait impairments) PT  Long Term Goal 2: Pt will improve knee AROM to [facility] in order to ascend and descend 5 steps with 1 handrail in order to enter family and friends home.  PT Long Term Goal 2 - Progress: Partly met Long Term Goal 3: Pt will improve her FOTO limiation to less than 48% for improved QOL.  Long Term Goal 3 Progress: Met (53/47)  Problem List Patient Active Problem List   Diagnosis Date Noted  . Glaucoma   . PVC (premature ventricular contraction) 10/21/2012  . Closed fracture of left tibial plateau 10/20/2012  . Fall 10/20/2012  . Obesity (BMI 30-39.9) 10/20/2012  . Preop cardiovascular exam 10/20/2012  . GERD (gastroesophageal reflux disease)   . Dyspnea 10/06/2012    PT - End of Session Activity Tolerance: Patient tolerated treatment well General Behavior During Therapy: [facility] for tasks assessed/performed  GP    Aldona Lento 03/01/2013, 2:59 PM

## 2013-03-03 ENCOUNTER — Ambulatory Visit (HOSPITAL_COMMUNITY)
Admission: RE | Admit: 2013-03-03 | Discharge: 2013-03-03 | Disposition: A | Discharge status: Home / Self Care | Payer: 59 | Source: Ambulatory Visit | Attending: Family Medicine | Admitting: Family Medicine

## 2013-03-03 NOTE — Progress Notes (Signed)
Physical Therapy Treatment Patient Details  Name: [name] MRN: [id number] Date of Birth: Sep 28, 1955  Today's Date: 03/03/2013 Time: 2683-4196 PT Time Calculation (min): 23 min Charge: Gait 1328-1352, NMR 1352-1415  Visit#: 15 of 18  Re-eval: 03/15/13 Assessment Diagnosis: Lt tibial plateau fracture Surgical Date: 10/19/12 Next MD Visit: Dr. Marcelino Scot - 03/24/2013  Subjective: Symptoms/Limitations Symptoms:  Pt reports walking on TM at home and without [place] indoors, pain free today.   Pain Assessment Currently in Pain?: No/denies  Precautions/Restrictions  Precautions Precaution Comments: WBAT  Exercise/Treatments Aerobic Tread Mill: Gait training x 10' @ 1.7 mph Standing Lateral Step Up: Left;20 reps;Hand Hold: 1;Step Height: 4" Forward Step Up: Left;20 reps;Hand Hold: 1;Step Height: 4" Step Down: Left;20 reps;Hand Hold: 1;Step Height: 4" Rocker Board: 2 minutes;Limitations Rocker Board Limitations: stabilize with intermittent HHA SLS: L11" Rt 19" max of 3 SLS with Vectors: 3x 5" with 1 HHA Gait Training: Gait training outdoors [phone]  Balance Exercises Standing Balance Beam: Tandem and retro gait 2RT with min cueing      Physical Therapy Assessment and Plan PT Assessment and Plan Clinical Impression Statement: Began gait training outdoor settings without AD including grass, incline/decline slopes, curbs and stairs at varioius heights, pt able to demonstrate appropriate gait mechanics with mild deficits safely with SBA.  Added vector stance for balance and hip mobility and progressed balance activities to dynamic surfaces with min assistance required for LOB episodes.   PT Plan: Continue to improve gait, balance and stair training.      Goals Home Exercise Program Pt/caregiver will Perform Home Exercise Program: Independently PT Short Term Goals Time to Complete Short Term Goals: 3 weeks PT Short Term Goal 1: Pt will improve her Lt knee AROM 0-120  degrees in order to prepare for independent gait.  PT Short Term Goal 2: Pt will present with moderate fascial restrictons throughout her LLE to reduce risk of secondary injuries.  PT Short Term Goal 3: Pt will be educated on compression garments to decrease risk of swelling.  PT Long Term Goals Time to Complete Long Term Goals:  (6 weeks) PT Long Term Goal 1: Pt will improve her LE functional strength in order to [facility] independently with mild gait impairments x10 minutes to return to functional work activities.  PT Long Term Goal 1 - Progress: Progressing toward goal PT Long Term Goal 2: Pt will improve knee AROM to [facility] in order to ascend and descend 5 steps with 1 handrail in order to enter family and friends home.  PT Long Term Goal 2 - Progress: Progressing toward goal Long Term Goal 3: Pt will improve her FOTO limiation to less than 48% for improved QOL.   Problem List Patient Active Problem List   Diagnosis Date Noted  . Glaucoma   . PVC (premature ventricular contraction) 10/21/2012  . Closed fracture of left tibial plateau 10/20/2012  . Fall 10/20/2012  . Obesity (BMI 30-39.9) 10/20/2012  . Preop cardiovascular exam 10/20/2012  . GERD (gastroesophageal reflux disease)   . Dyspnea 10/06/2012    PT - End of Session Equipment Utilized During Treatment: Gait belt Activity Tolerance: Patient tolerated treatment well General Behavior During Therapy: [facility] for tasks assessed/performed  GP    Aldona Lento 03/03/2013, 2:26 PM

## 2013-03-08 ENCOUNTER — Ambulatory Visit (HOSPITAL_COMMUNITY)
Admission: RE | Admit: 2013-03-08 | Discharge: 2013-03-08 | Disposition: A | Discharge status: Home / Self Care | Payer: 59 | Source: Ambulatory Visit | Attending: Orthopedic Surgery | Admitting: Orthopedic Surgery

## 2013-03-08 DIAGNOSIS — M6281 Muscle weakness (generalized): Secondary | ICD-10-CM | POA: Insufficient documentation

## 2013-03-08 DIAGNOSIS — IMO0001 Reserved for inherently not codable concepts without codable children: Secondary | ICD-10-CM | POA: Insufficient documentation

## 2013-03-08 DIAGNOSIS — M25569 Pain in unspecified knee: Secondary | ICD-10-CM | POA: Insufficient documentation

## 2013-03-08 DIAGNOSIS — R269 Unspecified abnormalities of gait and mobility: Secondary | ICD-10-CM | POA: Insufficient documentation

## 2013-03-08 DIAGNOSIS — M79609 Pain in unspecified limb: Secondary | ICD-10-CM | POA: Insufficient documentation

## 2013-03-08 NOTE — Progress Notes (Signed)
Physical Therapy Treatment Patient Details  Name: [name] MRN: [id number] Date of Birth: 1956-01-20  Today's Date: 03/08/2013 Time: 2297-9892 PT Time Calculation (min): 55 min Charge: TE [place], NMR 1440-1452, Gait 1452-1515, Ice [id number]  Visit#: 16 of 18  Re-eval: 03/15/13 Assessment Diagnosis: Lt tibial plateau fracture Surgical Date: 10/19/12 Next MD Visit: Dr. Marcelino Scot - 03/24/2013  Subjective: Symptoms/Limitations Symptoms: Pt reported walking alot over weekend without cane, pain scale 1-2/10 today.   Pain Assessment Currently in Pain?: Yes Pain Score: 2  Pain Location: Knee Pain Orientation: Left  Precautions/Restrictions  Precautions Precaution Comments: WBAT  Exercise/Treatments Aerobic Stationary Bike: 6' for warm up Tread Mill: Gait training x 10' @ 1.8 mph Standing Lateral Step Up: Left;20 reps;Hand Hold: 1;Step Height: 4" Forward Step Up: Left;20 reps;Hand Hold: 1;Step Height: 4" Step Down: Left;20 reps;Hand Hold: 1;Step Height: 4" Rocker Board: 2 minutes;Limitations Rocker Board Limitations: stabilize with intermittent HHA SLS: Lt 16", Rt 19" max of 3 SLS with Vectors: 3x 5" with 1 HHA Gait Training: Gait trainng indoors focusing on heel to toe, equal stride and stance phase  Other Standing Knee Exercises: Tandem and retro gait 2RT on balance beam   Modalities Modalities: Cryotherapy Cryotherapy Number Minutes Cryotherapy: 10 Minutes Cryotherapy Location: Knee Type of Cryotherapy: Ice pack  Physical Therapy Assessment and Plan PT Assessment and Plan Clinical Impression Statement: Continued session focus on improving gait mechanics and balance activtieis to improve confidence and reduce risk of falls.  Pt able to demonstrate appropriate gait mechanics with no noted gait deficitis at slow pace, mild deficits with increased gait velocity.  Increased ease with rockerboard stability exercises, continues to have difficulty with stair training.    PT Plan: Continue to improve gait, balance and stair training.      Goals Home Exercise Program Pt/caregiver will Perform Home Exercise Program: Independently PT Short Term Goals Time to Complete Short Term Goals: 3 weeks PT Short Term Goal 1: Pt will improve her Lt knee AROM 0-120 degrees in order to prepare for independent gait.  PT Short Term Goal 2: Pt will present with moderate fascial restrictons throughout her LLE to reduce risk of secondary injuries.  PT Short Term Goal 3: Pt will be educated on compression garments to decrease risk of swelling.  PT Long Term Goals Time to Complete Long Term Goals:  (6 weeks) PT Long Term Goal 1: Pt will improve her LE functional strength in order to [facility] independently with mild gait impairments x10 minutes to return to functional work activities.  PT Long Term Goal 1 - Progress: Progressing toward goal PT Long Term Goal 2: Pt will improve knee AROM to [facility] in order to ascend and descend 5 steps with 1 handrail in order to enter family and friends home.  PT Long Term Goal 2 - Progress: Progressing toward goal Long Term Goal 3: Pt will improve her FOTO limiation to less than 48% for improved QOL.   Problem List Patient Active Problem List   Diagnosis Date Noted  . Glaucoma   . PVC (premature ventricular contraction) 10/21/2012  . Closed fracture of left tibial plateau 10/20/2012  . Fall 10/20/2012  . Obesity (BMI 30-39.9) 10/20/2012  . Preop cardiovascular exam 10/20/2012  . GERD (gastroesophageal reflux disease)   . Dyspnea 10/06/2012    PT - End of Session Activity Tolerance: Patient tolerated treatment well General Behavior During Therapy: [facility] for tasks assessed/performed  GP    Aldona Lento 03/08/2013, 4:07 PM

## 2013-03-11 ENCOUNTER — Ambulatory Visit (HOSPITAL_COMMUNITY)
Admission: RE | Admit: 2013-03-11 | Discharge: 2013-03-11 | Disposition: A | Discharge status: Home / Self Care | Payer: 59 | Source: Ambulatory Visit | Attending: Family Medicine | Admitting: Family Medicine

## 2013-03-11 NOTE — Progress Notes (Signed)
Physical Therapy Treatment Patient Details  Name: [name] MRN: [id number] Date of Birth: Oct 14, 1955  Today's Date: 03/11/2013 Time: 8101-7510 PT Time Calculation (min): 42 min Visit#: 17 of 18  Re-eval: 03/15/13 Charges:  therex 40'    Subjective: Symptoms/Limitations Symptoms: Pt states steps continue to be her hardest.  States pain is no greater than 1.5/10.   Pain Assessment Currently in Pain?: Yes Pain Score: 2  Pain Location: Knee   Exercise/Treatments Aerobic Stationary Bike: 6' for ROM/mm warm up Tread Mill: Gait training x 10' @ 1.8-2.0 mph Standing Lateral Step Up: Left;15 reps;Hand Hold: 1;Step Height: 6" Lateral Step Up Limitations: stairwell height 6 in Forward Step Up: Left;15 reps;Hand Hold: 1;Step Height: 6" Step Down: Left;15 reps;Hand Hold: 1;Step Height: 6" Rocker Board: 2 minutes;Limitations SLS: Lt 14", Rt 30" max of 3 SLS with Vectors: 5x 5" Lt only with 1 HHA Other Standing Knee Exercises: Tandem and retro gait 2RT on balance beam     Physical Therapy Assessment and Plan PT Assessment:  Able to increase to 6" steps, however needs 1 HHA for forward step downs due to weakness.  Pt with instability on balance beam, however able to self correct.  Overall improving gait quality with minimal antalgia.  PT Plan: re-evaluate next visit.     Problem List Patient Active Problem List   Diagnosis Date Noted  . Glaucoma   . PVC (premature ventricular contraction) 10/21/2012  . Closed fracture of left tibial plateau 10/20/2012  . Fall 10/20/2012  . Obesity (BMI 30-39.9) 10/20/2012  . Preop cardiovascular exam 10/20/2012  . GERD (gastroesophageal reflux disease)   . Dyspnea 10/06/2012    PT - End of Session Activity Tolerance: Patient tolerated treatment well General Behavior During Therapy: [facility] for tasks assessed/performed      Teena Irani, PTA/CLT 03/11/2013, 2:42 PM

## 2013-03-15 ENCOUNTER — Ambulatory Visit (HOSPITAL_COMMUNITY)
Admission: RE | Admit: 2013-03-15 | Discharge: 2013-03-15 | Disposition: A | Discharge status: Home / Self Care | Payer: 59 | Source: Ambulatory Visit | Attending: Family Medicine | Admitting: Family Medicine

## 2013-03-15 NOTE — Progress Notes (Signed)
Physical Therapy Re-evaluation/Treatment/Discharge  Patient Details  Name: [name] MRN: [id number] Date of Birth: 1955-10-05  Today's Date: 03/15/2013 Time: 4098-1191 PT Time Calculation (min): 24 min Charge Self care (reviewed goals, FOTO) Gait 1356-1410.              Visit#: 18 of 18  Re-eval: 03/15/13 Assessment Diagnosis: Lt tibial plateau fracture Surgical Date: 10/19/12 Next MD Visit: Dr. Marcelino Scot - 03/24/2013  Subjective Symptoms/Limitations Symptoms: Pt entered dept with no AD, stated steps continues to be the hardest.  Currently pain free.   How long can you sit comfortably?: Able to sit comfortably for 2-3 hours Able to ride in vehicle to and from work 45 min ride, able to sit comfortably for one hour How long can you stand comfortably?: Able to stand for 45 minutes, able to prepare meal without difficutly (Able to stand for 10 minutes, able to prepare meal without rest breaks) How long can you walk comfortably?: Able to walk 3 blocks comforably, 20-30 minutes (Able to walk comforably for 10 minutes without AD, able to walk with stroller at grocery store for 20 minutes ) Pain Assessment Currently in Pain?: No/denies  Precautions/Restrictions  Precautions Precaution Comments: WBAT  Sensation/Coordination/Flexibility/Functional Tests Functional Tests Functional Tests: FOTO: 72/28 (FOTO: 45/55)  Assessment LLE AROM (degrees) Left Knee Extension: 0 Left Knee Flexion: 120 LLE Strength Left Hip Flexion: 5/5 Left Hip Extension: 5/5 Left Hip ABduction: 5/5 Left Hip ADduction: 5/5 Left Knee Flexion: 5/5 Left Knee Extension: 5/5  Exercise/Treatments Standing Stairs: 2 RT reciprocal pattern 1 HR    Physical Therapy Assessment and Plan PT Assessment and Plan Clinical Impression Statement: Re-eval complete with the following findings:  Pt independent with HEP and able to demonstrate appropraite technique with all exercises.  Pt has met all 3/3 STG and 3/3 LTGs.   Improved AROM 0-120, minimal fascial restrictions with no current pain.  Pt educated and wears compression garmets to assist with edema control.  Improved functional strength to ambulate for 20-30 minutes with mild gait impairments which pt is able to self cue to improve gait mechanics.  Pt able to ascend and descend stairwell reciprocal pattern with 1 handrail safely.  Pt with improved percieved functional ability with FOTO score status 72/ limitations 28%. PT Plan: D/C to HEP per all goals met.      Goals Home Exercise Program Pt/caregiver will Perform Home Exercise Program: Independently PT Short Term Goals Time to Complete Short Term Goals: 3 weeks PT Short Term Goal 1: Pt will improve her Lt knee AROM 0-120 degrees in order to prepare for independent gait.  PT Short Term Goal 1 - Progress: Met PT Short Term Goal 2: Pt will present with moderate fascial restrictons throughout her LLE to reduce risk of secondary injuries.  PT Short Term Goal 2 - Progress: Met PT Short Term Goal 3: Pt will be educated on compression garments to decrease risk of swelling.  PT Short Term Goal 3 - Progress: Met PT Long Term Goals Time to Complete Long Term Goals:  (6 weeks) PT Long Term Goal 1: Pt will improve her LE functional strength in order to [facility] independently with mild gait impairments x10 minutes to return to functional work activities.  PT Long Term Goal 1 - Progress: Met PT Long Term Goal 2: Pt will improve knee AROM to [facility] in order to ascend and descend 5 steps with 1 handrail in order to enter family and friends home.  PT Long Term Goal 2 -  Progress: Met Long Term Goal 3: Pt will improve her FOTO limiation to less than 48% for improved QOL.  Long Term Goal 3 Progress: Met  Problem List Patient Active Problem List   Diagnosis Date Noted  . Glaucoma   . PVC (premature ventricular contraction) 10/21/2012  . Closed fracture of left tibial plateau 10/20/2012  . Fall 10/20/2012  . Obesity  (BMI 30-39.9) 10/20/2012  . Preop cardiovascular exam 10/20/2012  . GERD (gastroesophageal reflux disease)   . Dyspnea 10/06/2012    PT - End of Session Activity Tolerance: Patient tolerated treatment well General Behavior During Therapy: [facility] for tasks assessed/performed PT Plan of Care PT Home Exercise Plan: given  GP    Aldona Lento 03/15/2013, 2:32 PM  Physician Documentation Your signature is required to indicate approval of the treatment plan as stated above.  Please sign and either send electronically or make a copy of this report for your files and return this physician signed original.   Please mark one 1.__approve of plan  2. ___approve of plan with the following conditions.   ______________________________                                                          _____________________ Physician Signature                                                                                                             Date

## 2013-03-17 ENCOUNTER — Ambulatory Visit (HOSPITAL_COMMUNITY): Payer: 59

## 2013-03-22 ENCOUNTER — Ambulatory Visit (HOSPITAL_COMMUNITY): Payer: 59 | Admitting: *Deleted

## 2013-03-24 ENCOUNTER — Ambulatory Visit (HOSPITAL_COMMUNITY): Payer: 59

## 2013-03-29 ENCOUNTER — Ambulatory Visit (HOSPITAL_COMMUNITY): Payer: 59

## 2013-03-31 ENCOUNTER — Ambulatory Visit (HOSPITAL_COMMUNITY): Payer: 59

## 2013-06-16 ENCOUNTER — Other Ambulatory Visit: Payer: Self-pay | Admitting: Family Medicine

## 2013-07-23 ENCOUNTER — Other Ambulatory Visit: Payer: Self-pay | Admitting: Family Medicine

## 2013-11-19 ENCOUNTER — Other Ambulatory Visit: Payer: Self-pay | Admitting: Family Medicine

## 2013-11-22 ENCOUNTER — Telehealth: Payer: Self-pay | Admitting: Nurse Practitioner

## 2013-11-22 ENCOUNTER — Other Ambulatory Visit: Payer: Self-pay

## 2013-11-22 DIAGNOSIS — R5382 Chronic fatigue, unspecified: Secondary | ICD-10-CM

## 2013-11-22 DIAGNOSIS — Z79899 Other long term (current) drug therapy: Secondary | ICD-10-CM

## 2013-11-22 DIAGNOSIS — E785 Hyperlipidemia, unspecified: Secondary | ICD-10-CM

## 2013-11-22 DIAGNOSIS — Z1231 Encounter for screening mammogram for malignant neoplasm of breast: Secondary | ICD-10-CM

## 2013-11-22 NOTE — Telephone Encounter (Signed)
Does patient need order for BW for upcoming physical?

## 2013-11-22 NOTE — Telephone Encounter (Signed)
Yes. Routine labs to include: lipid profile, liver profile, MET 7, TSH and vitamin D level; fasting please

## 2013-11-22 NOTE — Telephone Encounter (Signed)
Blood work orders place in [employer]. Patient notified.

## 2013-11-25 LAB — BASIC METABOLIC PANEL
BUN: 12 mg/dL (ref 6–23)
CHLORIDE: 103 meq/L (ref 96–112)
CO2: 29 mEq/L (ref 19–32)
CREATININE: 0.86 mg/dL (ref 0.50–1.10)
Calcium: 9.1 mg/dL (ref 8.4–10.5)
Glucose, Bld: 96 mg/dL (ref 70–99)
POTASSIUM: 4.6 meq/L (ref 3.5–5.3)
Sodium: 139 mEq/L (ref 135–145)

## 2013-11-25 LAB — HEPATIC FUNCTION PANEL
ALBUMIN: 3.9 g/dL (ref 3.5–5.2)
ALT: 19 U/L (ref 0–35)
AST: 21 U/L (ref 0–37)
Alkaline Phosphatase: 60 U/L (ref 39–117)
BILIRUBIN INDIRECT: 0.4 mg/dL (ref 0.2–1.2)
Bilirubin, Direct: 0.1 mg/dL (ref 0.0–0.3)
Total Bilirubin: 0.5 mg/dL (ref 0.2–1.2)
Total Protein: 6.6 g/dL (ref 6.0–8.3)

## 2013-11-25 LAB — LIPID PANEL
Cholesterol: 172 mg/dL (ref 0–200)
HDL: 34 mg/dL — AB (ref >39)
LDL CALC: 86 mg/dL (ref 0–99)
Total CHOL/HDL Ratio: 5.1 Ratio
Triglycerides: 260 mg/dL — ABNORMAL HIGH (ref <150)
VLDL: 52 mg/dL — AB (ref 0–40)

## 2013-11-25 LAB — TSH: TSH: 1.747 u[IU]/mL (ref 0.350–4.500)

## 2013-11-26 LAB — VITAMIN D 25 HYDROXY (VIT D DEFICIENCY, FRACTURES): Vit D, 25-Hydroxy: 34 ng/mL (ref 30–89)

## 2013-12-10 ENCOUNTER — Ambulatory Visit
Admission: RE | Admit: 2013-12-10 | Discharge: 2013-12-10 | Disposition: A | Discharge status: Home / Self Care | Payer: 59 | Source: Ambulatory Visit

## 2013-12-10 ENCOUNTER — Encounter: Payer: 59 | Admitting: Nurse Practitioner

## 2013-12-10 DIAGNOSIS — Z1231 Encounter for screening mammogram for malignant neoplasm of breast: Secondary | ICD-10-CM

## 2013-12-15 ENCOUNTER — Encounter: Payer: Self-pay | Admitting: Nurse Practitioner

## 2013-12-15 ENCOUNTER — Ambulatory Visit (INDEPENDENT_AMBULATORY_CARE_PROVIDER_SITE_OTHER): Payer: 59 | Admitting: Nurse Practitioner

## 2013-12-15 VITALS — BP 110/74 | HR 60 | Ht 62.0 in | Wt 213.0 lb

## 2013-12-15 DIAGNOSIS — Z124 Encounter for screening for malignant neoplasm of cervix: Secondary | ICD-10-CM

## 2013-12-15 DIAGNOSIS — Z01419 Encounter for gynecological examination (general) (routine) without abnormal findings: Secondary | ICD-10-CM

## 2013-12-15 DIAGNOSIS — Z0000 Encounter for general adult medical examination without abnormal findings: Secondary | ICD-10-CM

## 2013-12-16 LAB — PAP IG W/ RFLX HPV ASCU

## 2013-12-17 ENCOUNTER — Encounter: Payer: Self-pay | Admitting: Nurse Practitioner

## 2013-12-17 NOTE — Progress Notes (Signed)
   Subjective:    Patient ID: [name], female    DOB: [birth year], 58 y.o.   MRN: [id number]  HPI presents for wellness exam. Married, same sexual partner. No vaginal bleeding or pelvic pain. Regular vision and dental exams. Was seen by Dr. Johnsie Cancel, cardiologist for work up before orthopedic surgery. Normal stress test. Started on Metoprolol for PVCs. Has resumed activity as tolerated. Overall healthy diet.     Review of Systems  Constitutional: Negative for fever, activity change, appetite change and fatigue.  HENT: Negative for dental problem, ear pain, sinus pressure and sore throat.   Respiratory: Negative for cough, chest tightness, shortness of breath and wheezing.   Cardiovascular: Negative for chest pain.  Gastrointestinal: Negative for nausea, vomiting, diarrhea, constipation, blood in stool and abdominal distention.  Genitourinary: Negative for dysuria, urgency, frequency, vaginal bleeding, vaginal discharge, enuresis, difficulty urinating, genital sores and pelvic pain.       Objective:   Physical Exam  Constitutional: She is oriented to person, place, and time. She appears well-developed. No distress.  HENT:  Right Ear: External ear normal.  Left Ear: External ear normal.  Mouth/Throat: Oropharynx is clear and moist.  Neck: Normal range of motion. Neck supple. No tracheal deviation present. No thyromegaly present.  Cardiovascular: Normal rate, regular rhythm and normal heart sounds.  Exam reveals no gallop.   No murmur heard. Pulmonary/Chest: Effort normal and breath sounds normal.  Abdominal: Soft. She exhibits no distension. There is no tenderness.  Genitourinary: Vagina normal and uterus normal. No vaginal discharge found.  External GU: no rashes or lesions; vagina pale, no discharge; cervix normal in appearance; no CMT. Bimanual exam: no tenderness or obvious masses; ovaries nonpalpable. Exam limited due to abd girth. Rectal exam: no masses, no stool for  hemoccult.  Musculoskeletal: She exhibits no edema.  Lymphadenopathy:    She has no cervical adenopathy.  Neurological: She is alert and oriented to person, place, and time.  Skin: Skin is warm and dry. No rash noted.  Psychiatric: She has a normal mood and affect. Her behavior is normal.  Vitals reviewed. Breast exam: no masses; axillae no adenopathy.        Assessment & Plan:  Well woman exam - Plan: POC Hemoccult Bld/Stl (3-Cd Home Screen)  Screening for cervical cancer - Plan: Pap IG w/ reflex to HPV when ASC-U  Encouraged activity as tolerated, healthy diet and weight loss. Daily vitamin D and calcium supplementation. Defers bone density; will schedule at next PE. Return in about 1 year (around 12/16/2014).

## 2013-12-23 ENCOUNTER — Other Ambulatory Visit: Payer: Self-pay

## 2013-12-23 MED ORDER — METOPROLOL TARTRATE 25 MG PO TABS: 12.5000 mg | ORAL_TABLET | Freq: Two times a day (BID) | ORAL | Status: DC

## 2014-02-21 ENCOUNTER — Other Ambulatory Visit: Payer: Self-pay | Admitting: Cardiovascular Disease

## 2014-03-23 ENCOUNTER — Other Ambulatory Visit: Payer: Self-pay | Admitting: Cardiovascular Disease

## 2014-03-23 ENCOUNTER — Other Ambulatory Visit: Payer: Self-pay | Admitting: Family Medicine

## 2014-03-23 ENCOUNTER — Ambulatory Visit (HOSPITAL_COMMUNITY)
Admission: RE | Admit: 2014-03-23 | Discharge: 2014-03-23 | Disposition: A | Discharge status: Home / Self Care | Payer: 59 | Source: Ambulatory Visit | Attending: Orthopaedic Surgery | Admitting: Orthopaedic Surgery

## 2014-03-23 ENCOUNTER — Other Ambulatory Visit (HOSPITAL_COMMUNITY): Payer: Self-pay | Admitting: Orthopaedic Surgery

## 2014-03-23 DIAGNOSIS — M79662 Pain in left lower leg: Secondary | ICD-10-CM

## 2014-03-23 DIAGNOSIS — M79669 Pain in unspecified lower leg: Secondary | ICD-10-CM | POA: Diagnosis not present

## 2014-03-23 NOTE — Progress Notes (Signed)
*  Preliminary Results* Left lower extremity venous duplex completed. Left lower extremity is negative for deep vein thrombosis. There is no evidence of left Baker's cyst.  03/23/2014 3:59 PM  Maudry Mayhew, RVT, RDCS, RDMS

## 2014-03-31 ENCOUNTER — Other Ambulatory Visit: Payer: Self-pay

## 2014-03-31 MED ORDER — METOPROLOL TARTRATE 25 MG PO TABS: ORAL_TABLET | ORAL | Status: DC

## 2014-05-10 NOTE — Progress Notes (Signed)
Patient ID: [name], female   DOB: [birth year], 59 y.o.   MRN: [id number] 59 y.o.  Seen in 2014  for preop clearance Had left tibial plateau fracture while hiking. Had pins placed and had more definitive reduction and Rx 9/14 by Dr Marcelino Scot . Echo normal. Benign ambient PVC;s Telemetry and intra operative course benign in hospital.   Echo 10/20/12 EF 60-65% no valve disease Myovue 01/20/13 reviewed EF 65% no ischemia or infarct  QT long on ECG today No real palpitations but seems to continue to have high PVC burden No high risk family history On beta blocker   ROS: Denies fever, malais, weight loss, blurry vision, decreased visual acuity, cough, sputum, SOB, hemoptysis, pleuritic pain, palpitaitons, heartburn, abdominal pain, melena, lower extremity edema, claudication, or rash.  All other systems reviewed and negative  General: Affect appropriate Healthy:  appears stated age 14: normal Neck supple with no adenopathy JVP normal no bruits no thyromegaly Lungs clear with no wheezing and good diaphragmatic motion Heart:  S1/S2 no murmur, no rub, gallop or click PMI normal Abdomen: benighn, BS positve, no tenderness, no AAA no bruit.  No HSM or HJR Distal pulses intact with no bruits No edema Neuro non-focal Skin warm and dry LLE in mobile brace    Current Outpatient Prescriptions  Medication Sig Dispense Refill  . metoprolol tartrate (LOPRESSOR) 25 MG tablet TAKE 1/2 TABLET BY MOUTH 2 TIMES DAILY. 90 tablet 0  . ranitidine (ZANTAC) 300 MG tablet TAKE ONE TABLET BY MOUTH ONCE DAILY. 30 tablet 1  . Red Yeast Rice Extract (RED YEAST RICE PO) Take by mouth daily.    . TRAVATAN Z 0.004 % SOLN ophthalmic solution Place 1 drop into both eyes at bedtime.      No current facility-administered medications for this visit.    Allergies  Prednisone and Amoxicillin  Electrocardiogram:  11/03/12  SR rate 77 QT 434  PVC nonspecific ST/T Wave changes 05/10/14  SR rate 80  PVC's QT 448 /516    Assessment and Plan PVC:  Concerned about longish QT.  Will get cardiac MRI to r/o RV dysplasia and have her see Dr Caryl Comes to further assess  She has no  High risk family history and no syncope.  On beta blocker  BMET today as she will get gadolinum  Ativan script written to help with  Claustrophobia  Will need transportation   Chol:  Continue red yeast rice f/u labs primary  Arthritis:  PRN Tramadol  Left knee trauma and surgery Improved

## 2014-05-11 ENCOUNTER — Ambulatory Visit (INDEPENDENT_AMBULATORY_CARE_PROVIDER_SITE_OTHER): Payer: 59 | Admitting: Cardiovascular Disease

## 2014-05-11 ENCOUNTER — Encounter: Payer: Self-pay | Admitting: Cardiovascular Disease

## 2014-05-11 VITALS — Ht 62.0 in | Wt 217.6 lb

## 2014-05-11 DIAGNOSIS — I472 Ventricular tachycardia, unspecified: Secondary | ICD-10-CM

## 2014-05-11 DIAGNOSIS — I4581 Long QT syndrome: Secondary | ICD-10-CM | POA: Diagnosis not present

## 2014-05-11 DIAGNOSIS — Z01812 Encounter for preprocedural laboratory examination: Secondary | ICD-10-CM

## 2014-05-11 DIAGNOSIS — I493 Ventricular premature depolarization: Secondary | ICD-10-CM

## 2014-05-11 LAB — BASIC METABOLIC PANEL
BUN: 12 mg/dL (ref 6–23)
CO2: 26 mEq/L (ref 19–32)
CREATININE: 0.86 mg/dL (ref 0.40–1.20)
Calcium: 9.5 mg/dL (ref 8.4–10.5)
Chloride: 102 mEq/L (ref 96–112)
GFR: 71.44 mL/min (ref >60.00)
Glucose, Bld: 89 mg/dL (ref 70–99)
Potassium: 4.5 mEq/L (ref 3.5–5.1)
Sodium: 135 mEq/L (ref 135–145)

## 2014-05-11 MED ORDER — LORAZEPAM 0.5 MG PO TABS: 0.5000 mg | ORAL_TABLET | ORAL | Status: DC | PRN

## 2014-05-11 NOTE — Patient Instructions (Addendum)
Your physician wants you to follow-up in:    [place] will receive a reminder letter in the mail two months in advance. If you don't receive a letter, please call our office to schedule the follow-up appointment.  You have been referred to [place]  QT     Your physician has requested that you have a cardiac MRI. Cardiac MRI uses a computer to create images of your heart as its beating, producing both still and moving pictures of your heart and major blood vessels. For further information please visit [email or web address] Please follow the instruction sheet given to you today for more information.   Your physician recommends that you return for lab work in:  TODAY   BMET

## 2014-05-12 ENCOUNTER — Encounter: Payer: Self-pay | Admitting: Cardiovascular Disease

## 2014-05-20 ENCOUNTER — Other Ambulatory Visit: Payer: Self-pay | Admitting: Family Medicine

## 2014-05-30 ENCOUNTER — Ambulatory Visit (HOSPITAL_COMMUNITY)
Admission: RE | Admit: 2014-05-30 | Discharge: 2014-05-30 | Disposition: A | Discharge status: Home / Self Care | Payer: 59 | Source: Ambulatory Visit | Attending: Cardiovascular Disease | Admitting: Cardiovascular Disease

## 2014-05-30 DIAGNOSIS — I472 Ventricular tachycardia, unspecified: Secondary | ICD-10-CM

## 2014-05-30 DIAGNOSIS — I4581 Long QT syndrome: Secondary | ICD-10-CM | POA: Insufficient documentation

## 2014-05-30 MED ORDER — GADOBENATE DIMEGLUMINE 529 MG/ML IV SOLN
30.0000 mL | Freq: Once | INTRAVENOUS | Status: AC | PRN
  Administered 2014-05-30: 30 mL via INTRAVENOUS

## 2014-05-31 ENCOUNTER — Telehealth: Payer: Self-pay | Admitting: Cardiovascular Disease

## 2014-05-31 NOTE — Telephone Encounter (Signed)
PT  AWARE OF MRI RESULTS .Adonis Housekeeper

## 2014-05-31 NOTE — Telephone Encounter (Signed)
Follow Up      Pt returning Christine's call.

## 2014-06-20 ENCOUNTER — Other Ambulatory Visit: Payer: Self-pay | Admitting: Family Medicine

## 2014-07-08 ENCOUNTER — Other Ambulatory Visit: Payer: Self-pay | Admitting: Cardiovascular Disease

## 2014-07-20 ENCOUNTER — Other Ambulatory Visit: Payer: Self-pay | Admitting: Family Medicine

## 2014-12-08 ENCOUNTER — Other Ambulatory Visit: Payer: Self-pay

## 2014-12-08 DIAGNOSIS — Z1231 Encounter for screening mammogram for malignant neoplasm of breast: Secondary | ICD-10-CM

## 2014-12-15 ENCOUNTER — Other Ambulatory Visit: Payer: Self-pay | Admitting: Family Medicine

## 2014-12-22 ENCOUNTER — Ambulatory Visit
Admission: RE | Admit: 2014-12-22 | Discharge: 2014-12-22 | Disposition: A | Discharge status: Home / Self Care | Payer: 59 | Source: Ambulatory Visit

## 2014-12-22 DIAGNOSIS — Z1231 Encounter for screening mammogram for malignant neoplasm of breast: Secondary | ICD-10-CM

## 2015-01-12 ENCOUNTER — Other Ambulatory Visit: Payer: Self-pay | Admitting: Cardiovascular Disease

## 2015-02-15 ENCOUNTER — Encounter: Payer: Self-pay | Admitting: Cardiovascular Disease

## 2015-02-15 NOTE — Progress Notes (Signed)
Patient ID: [name], female   DOB: 1955/03/12, 60 y.o.   MRN: [id number]   60 y.o.  Seen in 60 years old 2014  for preop clearance 60 years old Had left tibial plateau fracture while hiking. Had pins placed and had more definitive reduction and Rx 9/14 by Dr Marcelino Scot . Echo normal. Benign ambient PVC;s Telemetry and intra operative course benign in hospital.   Echo 10/20/12 EF 60-65% no valve disease Myovue 01/20/13 reviewed EF 65% no ischemia or infarct MRI:  05/30/14  Normal  IMPRESSION: 1) Normal RV size and function no dysplasia  2) Normal LV size and function EF 53%  3) No delayed gadolinium uptake on inversion recovery sequences  4) Moderate LAE  QT long on ECG today No real palpitations but seems to continue to have high PVC burden No high risk family history On beta blocker   ROS: Denies fever, malais, weight loss, blurry vision, decreased visual acuity, cough, sputum, SOB, hemoptysis, pleuritic pain, palpitaitons, heartburn, abdominal pain, melena, lower extremity edema, claudication, or rash.  All other systems reviewed and negative  General: Affect appropriate Healthy:  appears stated age 60: normal Neck supple with no adenopathy JVP normal no bruits no thyromegaly Lungs clear with no wheezing and good diaphragmatic motion Heart:  S1/S2 no murmur, no rub, gallop or click PMI normal Abdomen: benighn, BS positve, no tenderness, no AAA no bruit.  No HSM or HJR Distal pulses intact with no bruits No edema Neuro non-focal Skin warm and dry LLE in mobile brace    Current Outpatient Prescriptions  Medication Sig Dispense Refill  . Fish Oil OIL Take 1 tablet by mouth daily.     . Loratadine (CLARITIN PO) Take 1 tablet by mouth daily.     . metoprolol tartrate (LOPRESSOR) 25 MG tablet TAKE 1/2 TABLET BY MOUTH 2 TIMES DAILY. 30 tablet 3  . ranitidine (ZANTAC) 300 MG tablet TAKE ONE TABLET BY MOUTH ONCE DAILY. 30 tablet 0  . TRAVATAN Z 0.004 % SOLN ophthalmic solution Place 1 drop into  both eyes at bedtime.      No current facility-administered medications for this visit.    Allergies  Prednisone and Amoxicillin  Electrocardiogram:  11/03/12  SR rate 77 QT 434  PVC nonspecific ST/T Wave changes 05/10/14  SR rate 80  PVC's QT 448 /516  02/16/15  SR rate 60 QT 460  PVC  Assessment and Plan  PVC:  Concerned about longish QT.  Myovue Echo and MRI ok ECG today ok QT 460   She has no  High risk family history and no syncope.  On beta blocker    Consider EP referral in future   Chol:  Continue red yeast rice f/u labs primary  Arthritis:  PRN Tramadol  Left knee trauma and surgery Improved    Jenkins Rouge

## 2015-02-16 ENCOUNTER — Encounter: Payer: Self-pay | Admitting: Cardiovascular Disease

## 2015-02-16 ENCOUNTER — Ambulatory Visit (INDEPENDENT_AMBULATORY_CARE_PROVIDER_SITE_OTHER): Payer: 59 | Admitting: Cardiovascular Disease

## 2015-02-16 VITALS — BP 124/64 | HR 60 | Ht 63.0 in | Wt 221.1 lb

## 2015-02-16 DIAGNOSIS — I4581 Long QT syndrome: Secondary | ICD-10-CM | POA: Diagnosis not present

## 2015-02-16 DIAGNOSIS — I493 Ventricular premature depolarization: Secondary | ICD-10-CM | POA: Diagnosis not present

## 2015-02-16 NOTE — Patient Instructions (Addendum)

## 2015-05-16 ENCOUNTER — Other Ambulatory Visit: Payer: Self-pay | Admitting: Cardiovascular Disease

## 2015-08-02 ENCOUNTER — Telehealth: Payer: Self-pay | Admitting: Gastroenterology

## 2015-08-02 NOTE — Telephone Encounter (Signed)
AUGUST RECALL FOR TCS

## 2015-08-03 NOTE — Telephone Encounter (Signed)
Letter mailed to pt.  

## 2015-09-18 NOTE — Progress Notes (Signed)
Patient ID: [name], female   DOB: [birth year], 60 y.o.   MRN: [id number]   60 y.o.  Seen in 2014  for preop clearance Had left tibial plateau fracture while hiking. Had pins placed and had more definitive reduction and Rx 9/14 by Dr Marcelino Scot . Echo normal. Benign ambient PVC;s Telemetry and intra operative course benign in hospital.   Echo 10/20/12 EF 60-65% no valve disease Myovue 01/20/13 reviewed EF 65% no ischemia or infarct MRI:  05/30/14  Normal  IMPRESSION: 1) Normal RV size and function no dysplasia  2) Normal LV size and function EF 53%  3) No delayed gadolinium uptake on inversion recovery sequences  4) Moderate LAE  QT long on ECG in past No real palpitations but seems to continue to have high PVC burden No high risk family history On beta blocker   Big [employer] fan  ROS: Denies fever, malais, weight loss, blurry vision, decreased visual acuity, cough, sputum, SOB, hemoptysis, pleuritic pain, palpitaitons, heartburn, abdominal pain, melena, lower extremity edema, claudication, or rash.  All other systems reviewed and negative  General: Affect appropriate Healthy:  appears stated age 40: normal Neck supple with no adenopathy JVP normal no bruits no thyromegaly Lungs clear with no wheezing and good diaphragmatic motion Heart:  S1/S2 no murmur, no rub, gallop or click PMI normal Abdomen: benighn, BS positve, no tenderness, no AAA no bruit.  No HSM or HJR Distal pulses intact with no bruits No edema Neuro non-focal Skin warm and dry LLE in mobile brace    Current Outpatient Prescriptions  Medication Sig Dispense Refill  . metoprolol tartrate (LOPRESSOR) 25 MG tablet TAKE 1/2 TABLET BY MOUTH 2 TIMES DAILY. 90 tablet 2  . ranitidine (ZANTAC) 300 MG tablet TAKE ONE TABLET BY MOUTH ONCE DAILY. 30 tablet 0  . TRAVATAN Z 0.004 % SOLN ophthalmic solution Place 1 drop into both eyes at bedtime.      No current facility-administered medications for  this visit.     Allergies  Prednisone and Amoxicillin  Electrocardiogram:  11/03/12  SR rate 77 QT 434  PVC nonspecific ST/T Wave changes 05/10/14  SR rate 80  PVC's QT 448 /516  02/16/15  SR rate 60 QT 460  PVC  Assessment and Plan  PVC:  Concerned about longish QT.  Myovue Echo and MRI ok ECG 02/16/15 ok QT 460   She has no  High risk family history and no syncope.  On beta blocker    Consider EP referral in future   Chol:  Continue red yeast rice f/u labs primary  Arthritis:  PRN Tramadol  Left knee trauma and surgery Improved    [name]

## 2015-09-19 ENCOUNTER — Encounter: Payer: Self-pay | Admitting: Cardiovascular Disease

## 2015-09-19 ENCOUNTER — Ambulatory Visit (INDEPENDENT_AMBULATORY_CARE_PROVIDER_SITE_OTHER): Payer: 59 | Admitting: Cardiovascular Disease

## 2015-09-19 VITALS — BP 110/70 | HR 69 | Ht 63.0 in | Wt 233.4 lb

## 2015-09-19 DIAGNOSIS — I4581 Long QT syndrome: Secondary | ICD-10-CM | POA: Diagnosis not present

## 2015-09-19 NOTE — Patient Instructions (Addendum)

## 2016-03-18 ENCOUNTER — Other Ambulatory Visit: Payer: Self-pay | Admitting: Cardiovascular Disease

## 2016-03-18 ENCOUNTER — Other Ambulatory Visit: Payer: Self-pay | Admitting: Family Medicine

## 2016-03-18 DIAGNOSIS — Z1231 Encounter for screening mammogram for malignant neoplasm of breast: Secondary | ICD-10-CM

## 2016-04-03 ENCOUNTER — Ambulatory Visit
Admission: RE | Admit: 2016-04-03 | Discharge: 2016-04-03 | Disposition: A | Discharge status: Home / Self Care | Payer: 59 | Source: Ambulatory Visit | Attending: Family Medicine | Admitting: Family Medicine

## 2016-04-03 DIAGNOSIS — Z1231 Encounter for screening mammogram for malignant neoplasm of breast: Secondary | ICD-10-CM

## 2016-04-22 ENCOUNTER — Other Ambulatory Visit: Payer: Self-pay | Admitting: Cardiovascular Disease

## 2016-05-11 ENCOUNTER — Other Ambulatory Visit: Payer: Self-pay | Admitting: Cardiovascular Disease

## 2016-06-20 ENCOUNTER — Other Ambulatory Visit: Payer: Self-pay | Admitting: Cardiovascular Disease

## 2016-07-16 ENCOUNTER — Encounter (HOSPITAL_COMMUNITY): Payer: Self-pay

## 2016-07-16 ENCOUNTER — Emergency Department (HOSPITAL_COMMUNITY)
Admission: EM | Admit: 2016-07-16 | Discharge: 2016-07-16 | Disposition: A | Discharge status: Home / Self Care | Payer: 59 | Attending: Emergency Medicine | Admitting: Emergency Medicine

## 2016-07-16 ENCOUNTER — Emergency Department (HOSPITAL_COMMUNITY): Payer: 59

## 2016-07-16 DIAGNOSIS — Z79899 Other long term (current) drug therapy: Secondary | ICD-10-CM | POA: Insufficient documentation

## 2016-07-16 DIAGNOSIS — R079 Chest pain, unspecified: Secondary | ICD-10-CM | POA: Diagnosis present

## 2016-07-16 DIAGNOSIS — R0789 Other chest pain: Secondary | ICD-10-CM | POA: Diagnosis not present

## 2016-07-16 DIAGNOSIS — Z96643 Presence of artificial hip joint, bilateral: Secondary | ICD-10-CM | POA: Diagnosis not present

## 2016-07-16 LAB — CBC WITH DIFFERENTIAL/PLATELET
Basophils Absolute: 0 K/uL (ref 0.0–0.1)
Basophils Relative: 0 %
Eosinophils Absolute: 0.1 K/uL (ref 0.0–0.7)
Eosinophils Relative: 2 %
HCT: 40.5 % (ref 36.0–46.0)
Hemoglobin: 13.9 g/dL (ref 12.0–15.0)
Lymphocytes Relative: 35 %
Lymphs Abs: 2.7 K/uL (ref 0.7–4.0)
MCH: 30.8 pg (ref 26.0–34.0)
MCHC: 34.3 g/dL (ref 30.0–36.0)
MCV: 89.8 fL (ref 78.0–100.0)
Monocytes Absolute: 0.4 K/uL (ref 0.1–1.0)
Monocytes Relative: 5 %
Neutro Abs: 4.6 K/uL (ref 1.7–7.7)
Neutrophils Relative %: 58 %
Platelets: 191 K/uL (ref 150–400)
RBC: 4.51 MIL/uL (ref 3.87–5.11)
RDW: 12.7 % (ref 11.5–15.5)
WBC: 7.9 K/uL (ref 4.0–10.5)

## 2016-07-16 LAB — COMPREHENSIVE METABOLIC PANEL
ALBUMIN: 4.1 g/dL (ref 3.5–5.0)
ALT: 20 U/L (ref 14–54)
AST: 26 U/L (ref 15–41)
Alkaline Phosphatase: 52 U/L (ref 38–126)
Anion gap: 7 (ref 5–15)
BILIRUBIN TOTAL: 0.5 mg/dL (ref 0.3–1.2)
BUN: 11 mg/dL (ref 6–20)
CO2: 25 mmol/L (ref 22–32)
Calcium: 9.1 mg/dL (ref 8.9–10.3)
Chloride: 103 mmol/L (ref 101–111)
Creatinine, Ser: 1 mg/dL (ref 0.44–1.00)
GFR calc Af Amer: >60 mL/min (ref >60)
GFR calc non Af Amer: 59 mL/min — ABNORMAL LOW (ref >60)
GLUCOSE: 117 mg/dL — AB (ref 65–99)
POTASSIUM: 3.9 mmol/L (ref 3.5–5.1)
SODIUM: 135 mmol/L (ref 135–145)
TOTAL PROTEIN: 6.8 g/dL (ref 6.5–8.1)

## 2016-07-16 LAB — I-STAT TROPONIN, ED
Troponin i, poc: 0.01 ng/mL (ref 0.00–0.08)
Troponin i, poc: 0 ng/mL (ref 0.00–0.08)

## 2016-07-16 NOTE — ED Provider Notes (Signed)
[place] DEPT Provider Note   CSN: [id number] Arrival date & time: 07/16/16  1009     History   Chief Complaint Chief Complaint  Patient presents with  . Chest Pain    HPI MARYJO RAGON is a 61 y.o. female history of reflux, PVCs here presenting with chest pain. Patient states that she has bilateral scapular pain and right-sided chest pain that radiated to the right jaw this morning. It lasted about 20 minutes and went away and then came back again. Which point she called EMS and she was given 324 aspirin and is currently pain-free. Denies any Associated shortness of breath or dizziness. She states that she follows with cardiology, Dr. Johnsie Cancel, for PVCs and had stress test and cardiac MRI in the past. Denies hx of CAD or cardiac stents. Denies recent travel or hx of PE or DVT.    The history is provided by the patient.    Past Medical History:  Diagnosis Date  . Allergic rhinitis   . Arthritis   . GERD (gastroesophageal reflux disease)   . Glaucoma   . Venous stasis     Patient Active Problem List   Diagnosis Date Noted  . 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 Glaucoma   . PVC (premature ventricular contraction) 10/21/2012  . Closed fracture of left tibial plateau 10/20/2012  . Fall 10/20/2012  . Obesity (BMI 30-39.9) 10/20/2012  . Preop cardiovascular exam 10/20/2012  . GERD (gastroesophageal reflux disease)   . Dyspnea 10/06/2012    Past Surgical History:  Procedure Laterality Date  . BACK SURGERY     cervical fusion   . CHOLECYSTECTOMY    . COLONOSCOPY     Hx:of  . EXTERNAL FIXATION LEG Left 10/20/2012   Procedure: EXTERNAL FIXATION Left Tibial Plateau Fx;  Surgeon: Rozanna Box, MD;  Location: [facility];  Service: Orthopedics;  Laterality: Left;  . EXTERNAL FIXATION REMOVAL Left 11/03/2012   Procedure: REMOVAL EXTERNAL FIXATION LEG;  Surgeon: Rozanna Box, MD;  Location: [facility];  Service: Orthopedics;  Laterality: Left;  . JOINT REPLACEMENT Bilateral    Hips   . ORIF TIBIA PLATEAU Left 11/03/2012   Dr Marcelino Scot  . ORIF TIBIA PLATEAU Left 11/03/2012   Procedure: OPEN REDUCTION INTERNAL FIXATION (ORIF) TIBIAL PLATEAU FRACTURE AND REMOVAL OF EXTERNAL FIXATION ;  Surgeon: Rozanna Box, MD;  Location: [facility];  Service: Orthopedics;  Laterality: Left;  . SHOULDER SURGERY    . TUBAL LIGATION    . vocal cord surgery      OB History    No data available       Home Medications    Prior to Admission medications   Medication Sig Start Date End Date Taking? Authorizing Provider  metoprolol tartrate (LOPRESSOR) 25 MG tablet TAKE 1/2 TABLET BY MOUTH 2 TIMES DAILY. Patient taking differently: TAKE 1/2 TABLET (12.5mg ) BY MOUTH 2 TIMES DAILY. 06/20/16  Yes Josue Hector, MD  ranitidine (ZANTAC) 150 MG tablet Take 150 mg by mouth 2 (two) times daily.   Yes [provider]  TRAVATAN Z 0.004 % SOLN ophthalmic solution Place 1 drop into both eyes at bedtime.  07/29/12  Yes [provider]  ranitidine (ZANTAC) 300 MG tablet TAKE ONE TABLET BY MOUTH ONCE DAILY. Patient not taking: Reported on 07/16/2016 12/15/14   Mikey Kirschner, MD    Family History Family History  Problem Relation Age of Onset  . Heart attack Father   . Heart attack Mother   .  Diabetes Brother     Social History Social History  Substance Use Topics  . Smoking status: Never Smoker  . Smokeless tobacco: Never Used  . Alcohol use No     Allergies   Prednisone and Amoxicillin   Review of Systems Review of Systems  Cardiovascular: Positive for chest pain.  All other systems reviewed and are negative.    Physical Exam Updated Vital Signs BP (!) 111/54   Pulse 68   Temp 98.3 F (36.8 C) (Oral)   Resp 19   Ht 5\' 3"  (1.6 m)   Wt 95.3 kg (210 lb)   SpO2 98%   BMI 37.20 kg/m   Physical Exam  Constitutional: She is oriented to person, place, and time. She appears well-developed and well-nourished.  HENT:  Head: Normocephalic.  Mouth/Throat:  Oropharynx is clear and moist.  Eyes: Conjunctivae and EOM are normal. Pupils are equal, round, and reactive to light.  Neck: Normal range of motion. Neck supple.  Cardiovascular: Normal rate, regular rhythm and normal heart sounds.   Pulmonary/Chest: Effort normal and breath sounds normal. No respiratory distress. She has no wheezes.  No reproducible tenderness   Abdominal: Soft. Bowel sounds are normal.  Musculoskeletal: Normal range of motion. She exhibits no edema or deformity.  Neurological: She is alert and oriented to person, place, and time. No cranial nerve deficit.  Skin: Skin is warm.  Psychiatric: She has a normal mood and affect.  Nursing note and vitals reviewed.    ED Treatments / Results  Labs (all labs ordered are listed, but only abnormal results are displayed) Labs Reviewed  COMPREHENSIVE METABOLIC PANEL - Abnormal; Notable for the following:       Result Value   Glucose, Bld 117 (*)    GFR calc non Af Amer 59 (*)    All other components within normal limits  CBC WITH DIFFERENTIAL/PLATELET  Randolm Idol, ED  I-STAT TROPOININ, ED    EKG  EKG Interpretation  Date/Time:  Tuesday July 16 2016 10:24:00 EDT Ventricular Rate:  75 PR Interval:    QRS Duration: 100 QT Interval:  462 QTC Calculation: 517 R Axis:   -16 Text Interpretation:  Sinus rhythm Multiform ventricular premature complexes Borderline left axis deviation Abnormal R-wave progression, early transition Minimal ST depression, lateral leads Prolonged QT interval No significant change since last tracing Confirmed by Wandra Arthurs [phone]) on 07/16/2016 10:50:27 AM       Radiology Dg Chest 2 View  Result Date: 07/16/2016 CLINICAL DATA:  Left-sided chest pain. EXAM: CHEST  2 VIEW COMPARISON:  Radiograph of October 21, 2012. FINDINGS: The heart size and mediastinal contours are within normal limits. Both lungs are clear. No pneumothorax or pleural effusion is noted. The visualized skeletal  structures are unremarkable. IMPRESSION: No active cardiopulmonary disease. Electronically Signed   By: Marijo Conception, M.D.   On: 07/16/2016 11:31    Procedures Procedures (including critical care time)  Medications Ordered in ED Medications - No data to display   Initial Impression / Assessment and Plan / ED Course  I have reviewed the triage vital signs and the nursing notes.  Pertinent labs & imaging results that were available during my care of the patient were reviewed by me and considered in my medical decision making (see chart for details).    MAURIANA DANN is a 61 y.o. female here with chest pain. Consider ACS and will get trop x 2. No known CAD. I doubt PE or dissection. Pain  free currently. Will get labs, trop x 2, CXR.   4:01 PM Delta trop neg. Labs and CXR clear. Remained pain free. Recommend ASA 81 mg daily. She saw Dr. Johnsie Cancel in the past and recommend calling office for appointment.   Final Clinical Impressions(s) / ED Diagnoses   Final diagnoses:  None    New Prescriptions New Prescriptions   No medications on file     Drenda Freeze, MD 07/16/16 [phone]

## 2016-07-16 NOTE — Discharge Instructions (Signed)
Take ASA 81 mg daily.   Call Dr. Johnsie Cancel for follow up   Return to ER if you have worse chest pain, trouble breathing, shortness of breath.

## 2016-07-16 NOTE — ED Notes (Signed)
Patient transported to X-ray 

## 2016-07-16 NOTE — ED Triage Notes (Signed)
Pt brought in by EMS due to having chest pain. Pt was at work and started having pain that originated in right shoulder and radiated to chest, neck, and jaw. Pt received 324mg  of aspirin. Pt denies n/v, SOB, or dizziness. Pt does endorse diaphoresis.

## 2016-07-17 ENCOUNTER — Telehealth: Payer: Self-pay | Admitting: Cardiovascular Disease

## 2016-07-17 DIAGNOSIS — R079 Chest pain, unspecified: Secondary | ICD-10-CM

## 2016-07-17 NOTE — Telephone Encounter (Signed)
Patient recently was taken to ED for chest pain. Patient wanted Dr. Johnsie Cancel to know this happen and wanted to be seen soon. Made patient an appointment in August for 1 year f/u. Will forward to Dr. Johnsie Cancel to review ED visit and see if patient needs to be seen sooner.

## 2016-07-17 NOTE — Telephone Encounter (Signed)
New message  Pt call requesting to speak with RN about getting a sooner appt. Pt states she went to the hospital for stroke like symptoms. Pt states she did not have a stroke, but feels she need to be seen by only Dr. Johnsie Cancel. Please call back to discuss

## 2016-07-17 NOTE — Telephone Encounter (Signed)
Called patient informed her that Dr. Johnsie Cancel wanted to order a lexiscan stress test, and someone would be calling to schedule test with her. Informed patient of test instructions. Patient verbalized understanding. Will send message to scheduling. Will call patient if appointment needs to be moved up.

## 2016-07-17 NOTE — Telephone Encounter (Signed)
Have her get lexiscan myovue next week and can move appt up if abnormal no known CAD

## 2016-07-22 ENCOUNTER — Telehealth (HOSPITAL_COMMUNITY): Payer: Self-pay | Admitting: *Deleted

## 2016-07-22 NOTE — Telephone Encounter (Signed)
Left message on voicemail in reference to upcoming appointment scheduled for 07/23/16. Phone number given for a call back so details instructions can be given.  Kirstie Peri

## 2016-07-23 ENCOUNTER — Ambulatory Visit (HOSPITAL_COMMUNITY): Payer: 59 | Attending: Cardiovascular Disease

## 2016-07-23 DIAGNOSIS — R9439 Abnormal result of other cardiovascular function study: Secondary | ICD-10-CM | POA: Diagnosis not present

## 2016-07-23 DIAGNOSIS — R002 Palpitations: Secondary | ICD-10-CM | POA: Insufficient documentation

## 2016-07-23 DIAGNOSIS — Z8249 Family history of ischemic heart disease and other diseases of the circulatory system: Secondary | ICD-10-CM | POA: Insufficient documentation

## 2016-07-23 DIAGNOSIS — R079 Chest pain, unspecified: Secondary | ICD-10-CM | POA: Insufficient documentation

## 2016-07-23 DIAGNOSIS — I2510 Atherosclerotic heart disease of native coronary artery without angina pectoris: Secondary | ICD-10-CM | POA: Insufficient documentation

## 2016-07-23 DIAGNOSIS — R0609 Other forms of dyspnea: Secondary | ICD-10-CM | POA: Insufficient documentation

## 2016-07-23 MED ORDER — REGADENOSON 0.4 MG/5ML IV SOLN
0.4000 mg | Freq: Once | INTRAVENOUS | Status: AC
  Administered 2016-07-23: 0.4 mg via INTRAVENOUS

## 2016-07-23 MED ORDER — TECHNETIUM TC 99M TETROFOSMIN IV KIT
33.0000 | PACK | Freq: Once | INTRAVENOUS | Status: AC | PRN
  Administered 2016-07-23: 33 via INTRAVENOUS
  Filled 2016-07-23: qty 33

## 2016-07-25 ENCOUNTER — Ambulatory Visit (HOSPITAL_COMMUNITY): Payer: 59 | Attending: Cardiology

## 2016-07-25 LAB — MYOCARDIAL PERFUSION IMAGING
CHL CUP NUCLEAR SDS: 6
CHL CUP NUCLEAR SSS: 16
LHR: 0.35
LVDIAVOL: 114 mL (ref 46–106)
LVSYSVOL: 54 mL
Peak HR: 97 {beats}/min
Rest HR: 61 {beats}/min
SRS: 10
TID: 1.05

## 2016-07-25 MED ORDER — TECHNETIUM TC 99M TETROFOSMIN IV KIT
31.2000 | PACK | Freq: Once | INTRAVENOUS | Status: AC | PRN
  Administered 2016-07-25: 31.2 via INTRAVENOUS
  Filled 2016-07-25: qty 32

## 2016-08-01 NOTE — Progress Notes (Signed)
Cardiology Office Note   Date:  08/02/2016   ID:  [name], DOB May 06, 1955, MRN [id number]  PCP:  Mikey Kirschner, MD  Cardiologist:  Dr. Johnsie Cancel     Chief Complaint  Patient presents with  . Chest Pain      History of Present Illness: [name] is a 61 y.o. female who presents for chest pain with abnormal nuc study and plan for cardiac cath.   Pt with hx of PVC and longish QT interval.  She has had myoview and MRI,  myoview in 2014 with normal study, no ischemia or infarction.  Echo at that time EF 55-60%. No RWMA.  Prior cardiac MRI normal RV and LV, EF 53%, moderate LAE.    On 07/16/16 pt with chest pain and seen in ER.  Pain then described as bilateral scapular pain and rt sided chest pain with radiation to rt jaw.  Lasted about 20 min and went away, but then returned.  She called EMS.  She rec'd asa 325 mg.  CXR was normal. Troponin neg X 2,  Labs normal see below. EKG with SR with PVCs mild lateral ischemia.    She was discharged but Dr. Johnsie Cancel was sent info and scheduled myoview. EF was 53%, freq PVCs couplets and triplets.  She had defects in basal inferolateral, mid anterior and mid inferolateral  apical anterior, apical lateral and apex location. This defect is fixed with no evidence of ischemia. Consistent with prior infarct.  This was new from prior study.  Dr. Johnsie Cancel would like pt to have cardiac cath.   Today pt tells me she has had no further pain.Marland Kitchen  She was at work and developed Rt scapula pain with radiation around to rt chest and into jaw.  No nausea or SOB, and went to ER.   She does have FH of CAD with both parents.  On no statins.  Also with neck pain.  Is to have cervical MRI on Monday.  She is on mobic.   We discussed cardiac cath procedure and risks and how long out of work.    Past Medical History:  Diagnosis Date  . Allergic rhinitis   . Arthritis   . GERD (gastroesophageal reflux disease)   . Glaucoma   . Venous stasis     Past  Surgical History:  Procedure Laterality Date  . BACK SURGERY     cervical fusion   . CHOLECYSTECTOMY    . COLONOSCOPY     Hx:of  . EXTERNAL FIXATION LEG Left 10/20/2012   Procedure: EXTERNAL FIXATION Left Tibial Plateau Fx;  Surgeon: Rozanna Box, MD;  Location: [place] [facility];  Service: Orthopedics;  Laterality: Left;  . EXTERNAL FIXATION REMOVAL Left 11/03/2012   Procedure: REMOVAL EXTERNAL FIXATION LEG;  Surgeon: Rozanna Box, MD;  Location: [facility];  Service: Orthopedics;  Laterality: Left;  . JOINT REPLACEMENT Bilateral    Hips   . ORIF TIBIA PLATEAU Left 11/03/2012   Dr Marcelino Scot  . ORIF TIBIA PLATEAU Left 11/03/2012   Procedure: OPEN REDUCTION INTERNAL FIXATION (ORIF) TIBIAL PLATEAU FRACTURE AND REMOVAL OF EXTERNAL FIXATION ;  Surgeon: Rozanna Box, MD;  Location: [facility];  Service: Orthopedics;  Laterality: Left;  . SHOULDER SURGERY    . TUBAL LIGATION    . vocal cord surgery       Current Outpatient Prescriptions  Medication Sig Dispense Refill  . cyclobenzaprine (FLEXERIL) 5 MG tablet Take by mouth. TAKE 1-2 TABLETS BY MOUTH DAILY  AT BEDTIME    . meloxicam (MOBIC) 15 MG tablet Take 15 mg by mouth daily as needed for pain.    . methocarbamol (ROBAXIN) 500 MG tablet Take 500 mg by mouth 2 (two) times daily.    . metoprolol tartrate (LOPRESSOR) 25 MG tablet TAKE 1/2 TABLET BY MOUTH 2 TIMES DAILY. 30 tablet 2  . ranitidine (ZANTAC) 150 MG tablet Take 150 mg by mouth 2 (two) times daily.    . TRAVATAN Z 0.004 % SOLN ophthalmic solution Place 1 drop into both eyes at bedtime.      No current facility-administered medications for this visit.     Allergies:   Prednisone and Amoxicillin    Social History:  The patient  reports that she has never smoked. She has never used smokeless tobacco. She reports that she does not drink alcohol or use drugs.   Family History:  The patient's family history includes Diabetes in her brother; Heart attack in her father and mother.    ROS:   General:no colds or fevers, weight changes on different scales Skin:no rashes or ulcers HEENT:no blurred vision, no congestion CV:see HPI PUL:see HPI GI:no diarrhea constipation or melena, no indigestion GU:no hematuria, no dysuria MS:no joint pain, no claudication cervical disc disease Neuro:no syncope, no lightheadedness Endo:no diabetes, no thyroid disease  Wt Readings from Last 3 Encounters:  08/02/16 235 lb 6.4 oz (106.8 kg)  07/23/16 210 lb (95.3 kg)  07/16/16 210 lb (95.3 kg)     PHYSICAL EXAM: VS:  BP (!) 104/50   Pulse 72   Ht 5\' 3"  (1.6 m)   Wt 235 lb 6.4 oz (106.8 kg)   BMI 41.70 kg/m  , BMI Body mass index is 41.7 kg/m. General:Pleasant affect, NAD Skin:Warm and dry, brisk capillary refill HEENT:normocephalic, sclera clear, mucus membranes moist Neck:supple, no JVD, no bruits  Heart:S1S2 RRR without murmur, gallup, rub or click Lungs:clear without rales, rhonchi, or wheezes PNT:IRWER, soft, non tender, + BS, do not palpate liver spleen or masses Ext:no lower ext edema, 2+ pedal pulses, 2+ radial pulses Neuro:alert and oriented x 3, MAE, follows commands, + facial symmetry    EKG:  EKG is ordered today. The ekg ordered today demonstrates SR with PVCs Qtc 503 ms.  Less than ER.  No acute changes.     Recent Labs: 07/16/2016: ALT 20; BUN 11; Creatinine, Ser 1.00; Hemoglobin 13.9; Platelets 191; Potassium 3.9; Sodium 135    Lipid Panel    Component Value Date/Time   CHOL 172 11/25/2013 0704   TRIG 260 (H) 11/25/2013 0704   HDL 34 (L) 11/25/2013 0704   CHOLHDL 5.1 11/25/2013 0704   VLDL 52 (H) 11/25/2013 0704   LDLCALC 86 11/25/2013 0704       Other studies Reviewed: Additional studies/ records that were reviewed today include: .  Nuc study 07/25/16 Study Highlights    Nuclear stress EF: 53%.  There was less than 26mm of downsloping ST segment depression during infusion with frequent PVCs, ventricular couplets and triplets.  There is a defect  present in the basal inferolateral, mid anterior, mid inferolateral, apical anterior, apical lateral and apex location. This defect is fixed with no evidence of ischemia. Consistent with prior infarct.  This is an intermediate risk study.  The left ventricular ejection fraction is mildly decreased (45-54%).     MRI:  05/30/14  Normal  IMPRESSION: 1) Normal RV size and function no dysplasia  2) Normal LV size and function EF 53%  3)  No delayed gadolinium uptake on inversion recovery sequences  4) Moderate LAE  ASSESSMENT AND PLAN:  1.  Angina with Rt side chest pain.  And abnormal nuc study.  Will proceed with cardiac cath.  Pt agreeable.  Discussed at length.  She will begin ASA 81 mg daily.  Will check lipids today with labs, non fasting.  The patient understands that risks included but are not limited to stroke (1 in 1000), death (1 in 107), kidney failure [usually temporary] (1 in 500), bleeding (1 in 200), allergic reaction [possibly serious] (1 in 200).   2.  Prolonged Qtc, + PVCs has had this for some time.  3. Cervical disc pain for MRI  , + Arthritis.    4. Lipids check today.     Current medicines are reviewed with the patient today.  The patient Has no concerns regarding medicines.  The following changes have been made:  See above Labs/ tests ordered today include:see above  Disposition:   FU:  see above  Signed, Cecilie Kicks, NP  08/02/2016 11:57 AM    [address], [place], [place], [place] Phone: [phone]; Fax: [phone]

## 2016-08-02 ENCOUNTER — Ambulatory Visit (INDEPENDENT_AMBULATORY_CARE_PROVIDER_SITE_OTHER): Payer: 59 | Admitting: Cardiology

## 2016-08-02 ENCOUNTER — Encounter: Payer: Self-pay | Admitting: Cardiology

## 2016-08-02 VITALS — BP 104/50 | HR 72 | Ht 63.0 in | Wt 235.4 lb

## 2016-08-02 DIAGNOSIS — R079 Chest pain, unspecified: Secondary | ICD-10-CM

## 2016-08-02 DIAGNOSIS — I493 Ventricular premature depolarization: Secondary | ICD-10-CM | POA: Diagnosis not present

## 2016-08-02 DIAGNOSIS — I4581 Long QT syndrome: Secondary | ICD-10-CM

## 2016-08-02 DIAGNOSIS — R9439 Abnormal result of other cardiovascular function study: Secondary | ICD-10-CM | POA: Diagnosis not present

## 2016-08-02 DIAGNOSIS — M5090 Cervical disc disorder, unspecified, unspecified cervical region: Secondary | ICD-10-CM | POA: Diagnosis not present

## 2016-08-02 MED ORDER — ASPIRIN EC 81 MG PO TBEC: 81.0000 mg | DELAYED_RELEASE_TABLET | Freq: Every day | ORAL | Status: DC

## 2016-08-02 NOTE — Patient Instructions (Addendum)
Medication Instructions:  1. START ASPIRIN 81 MG DAILY  2. CONTINUE ALL OTHER MEDICATIONS AS PRESCRIBED  Labwork: TODAY  BMET, CBC, PT/INR, LIPID PANEL  Testing/Procedures: Your physician has requested that you have a cardiac catheterization. Cardiac catheterization is used to diagnose and/or treat various heart conditions. Doctors may recommend this procedure for a number of different reasons. The most common reason is to evaluate chest pain. Chest pain can be a symptom of coronary artery disease (CAD), and cardiac catheterization can show whether plaque is narrowing or blocking your heart's arteries. This procedure is also used to evaluate the valves, as well as measure the blood flow and oxygen levels in different parts of your heart. For further information please visit [email or web address]. Please follow instruction sheet, as given.   Follow-Up: Cecilie Kicks, NP POST CATH FOLLOW UP   Any Other Special Instructions Will Be Listed Below (If Applicable). If you need a refill on your cardiac medications before your next appointment, please call your pharmacy.   [facility] [address] [place] [address] [place] [address] Dept: [phone] Loc: [place]  08/02/2016  You are scheduled for a Cardiac Catheterization on Friday, July 6 with Dr. Larae Grooms.  1. Please arrive at the [facility] (Main Entrance A) at [facility]: [address] [place], [place] [address] at 5:30 AM (two hours before your procedure to ensure your preparation). Free valet parking service is available.   Special note: Every effort is made to have your procedure done on time. Please understand that emergencies sometimes delay scheduled procedures.  2. Diet: Do not eat or drink anything after midnight prior to your procedure except sips of water to take medications.  3. Labs: You will need to  have blood drawn on Friday, June 29 at [facility] at [facility]. [address]  Open: 7:30am - 5pm    Phone: [phone]. You do not need to be fasting.  4. Medication instructions in preparation for your procedure:    On the morning of your procedure, take your Aspirin 81 MG.  You may use sips of water.  5. Plan for one night stay--bring personal belongings. 6. Bring a current list of your medications and current insurance cards. 7. You MUST have a responsible person to drive you home. 8. Someone MUST be with you the first 24 hours after you arrive home or your discharge will be delayed. 9. Please wear clothes that are easy to get on and off and wear slip-on shoes.  Thank you for allowing [place] to care for you!   -- [facility]

## 2016-08-03 LAB — BASIC METABOLIC PANEL
BUN / CREAT RATIO: 11 — AB (ref 12–28)
BUN: 10 mg/dL (ref 8–27)
CHLORIDE: 103 mmol/L (ref 96–106)
CO2: 23 mmol/L (ref 20–29)
Calcium: 9.4 mg/dL (ref 8.7–10.3)
Creatinine, Ser: 0.9 mg/dL (ref 0.57–1.00)
GFR calc Af Amer: 79 mL/min/{1.73_m2} (ref >59)
GFR calc non Af Amer: 69 mL/min/{1.73_m2} (ref >59)
GLUCOSE: 87 mg/dL (ref 65–99)
POTASSIUM: 4.6 mmol/L (ref 3.5–5.2)
SODIUM: 141 mmol/L (ref 134–144)

## 2016-08-03 LAB — CBC
Hematocrit: 39.5 % (ref 34.0–46.6)
Hemoglobin: 13.5 g/dL (ref 11.1–15.9)
MCH: 30.9 pg (ref 26.6–33.0)
MCHC: 34.2 g/dL (ref 31.5–35.7)
MCV: 90 fL (ref 79–97)
PLATELETS: 207 10*3/uL (ref 150–379)
RBC: 4.37 x10E6/uL (ref 3.77–5.28)
RDW: 14.2 % (ref 12.3–15.4)
WBC: 7.1 10*3/uL (ref 3.4–10.8)

## 2016-08-03 LAB — LIPID PANEL
CHOLESTEROL TOTAL: 182 mg/dL (ref 100–199)
Chol/HDL Ratio: 5.5 ratio — ABNORMAL HIGH (ref 0.0–4.4)
HDL: 33 mg/dL — ABNORMAL LOW (ref >39)
Triglycerides: 478 mg/dL — ABNORMAL HIGH (ref 0–149)

## 2016-08-03 LAB — PROTIME-INR
INR: 0.9 (ref 0.8–1.2)
Prothrombin Time: 10 s (ref 9.1–12.0)

## 2016-08-05 ENCOUNTER — Telehealth: Payer: Self-pay | Admitting: *Deleted

## 2016-08-05 DIAGNOSIS — Z79899 Other long term (current) drug therapy: Secondary | ICD-10-CM

## 2016-08-05 MED ORDER — ATORVASTATIN CALCIUM 40 MG PO TABS: 40.0000 mg | ORAL_TABLET | Freq: Every day | ORAL | 3 refills | Status: DC

## 2016-08-05 NOTE — Telephone Encounter (Signed)
-----   Message from Isaiah Serge, NP sent at 08/04/2016  9:15 PM EDT ----- Labs good for cath, but add lipitor 40 mg daily.  Triglycerides elevated.  Unable to eval LDL recheck hepatic and lipid in 6 weeks.

## 2016-08-08 ENCOUNTER — Telehealth: Payer: Self-pay

## 2016-08-08 NOTE — Telephone Encounter (Signed)
Call back received from patient.  Confiired pre-catheterization at [facility] scheduled for: 08/09/2016 @ 0730 Verified arrival time and place:  [facility] @ 0530 Confirmed AM meds to be taken pre-cath with sip of water:  Confirmed Pt to take ASA prior to arrival.  Pt may also take her metoprolol Confirmed patient has responsible person to drive home post procedure and observe patient for 24 hours: husband Addl concerns:  Pt has allergy to prednisone

## 2016-08-08 NOTE — Telephone Encounter (Signed)
Outreach made to patient to provide pre cath instruction.  Call went to VM.  Left generic message requesting call back.  This nurse name and # left for call back.

## 2016-08-09 ENCOUNTER — Encounter (HOSPITAL_COMMUNITY)
Admission: RE | Disposition: A | Discharge status: Home / Self Care | Payer: Self-pay | Source: Ambulatory Visit | Attending: Interventional Cardiology

## 2016-08-09 ENCOUNTER — Encounter (HOSPITAL_COMMUNITY): Payer: Self-pay | Admitting: Interventional Cardiology

## 2016-08-09 ENCOUNTER — Ambulatory Visit (HOSPITAL_COMMUNITY)
Admission: RE | Admit: 2016-08-09 | Discharge: 2016-08-09 | Disposition: A | Discharge status: Home / Self Care | Payer: 59 | Source: Ambulatory Visit | Attending: Interventional Cardiology | Admitting: Interventional Cardiology

## 2016-08-09 DIAGNOSIS — Z8249 Family history of ischemic heart disease and other diseases of the circulatory system: Secondary | ICD-10-CM | POA: Insufficient documentation

## 2016-08-09 DIAGNOSIS — Z7982 Long term (current) use of aspirin: Secondary | ICD-10-CM | POA: Insufficient documentation

## 2016-08-09 DIAGNOSIS — Z79899 Other long term (current) drug therapy: Secondary | ICD-10-CM | POA: Insufficient documentation

## 2016-08-09 DIAGNOSIS — Z880 Allergy status to penicillin: Secondary | ICD-10-CM | POA: Insufficient documentation

## 2016-08-09 DIAGNOSIS — M1990 Unspecified osteoarthritis, unspecified site: Secondary | ICD-10-CM | POA: Diagnosis not present

## 2016-08-09 DIAGNOSIS — R9439 Abnormal result of other cardiovascular function study: Secondary | ICD-10-CM | POA: Diagnosis present

## 2016-08-09 DIAGNOSIS — K219 Gastro-esophageal reflux disease without esophagitis: Secondary | ICD-10-CM | POA: Diagnosis not present

## 2016-08-09 HISTORY — PX: LEFT HEART CATH AND CORONARY ANGIOGRAPHY: CATH118249

## 2016-08-09 SURGERY — LEFT HEART CATH AND CORONARY ANGIOGRAPHY
Anesthesia: LOCAL

## 2016-08-09 MED ORDER — SODIUM CHLORIDE 0.9 % IV SOLN: INTRAVENOUS | Status: AC

## 2016-08-09 MED ORDER — ASPIRIN 81 MG PO CHEW: 81.0000 mg | CHEWABLE_TABLET | ORAL | Status: DC

## 2016-08-09 MED ORDER — HEPARIN SODIUM (PORCINE) 1000 UNIT/ML IJ SOLN
INTRAMUSCULAR | Status: AC
  Filled 2016-08-09: qty 1

## 2016-08-09 MED ORDER — LIDOCAINE HCL (PF) 1 % IJ SOLN
INTRAMUSCULAR | Status: DC | PRN
  Administered 2016-08-09: 2 mL

## 2016-08-09 MED ORDER — SODIUM CHLORIDE 0.9 % IV SOLN: 250.0000 mL | INTRAVENOUS | Status: DC | PRN

## 2016-08-09 MED ORDER — FENTANYL CITRATE (PF) 100 MCG/2ML IJ SOLN
INTRAMUSCULAR | Status: DC | PRN
  Administered 2016-08-09: 25 ug via INTRAVENOUS

## 2016-08-09 MED ORDER — LIDOCAINE HCL 1 % IJ SOLN
INTRAMUSCULAR | Status: AC
  Filled 2016-08-09: qty 20

## 2016-08-09 MED ORDER — SODIUM CHLORIDE 0.9% FLUSH: 3.0000 mL | Freq: Two times a day (BID) | INTRAVENOUS | Status: DC

## 2016-08-09 MED ORDER — HEPARIN (PORCINE) IN NACL 2-0.9 UNIT/ML-% IJ SOLN
INTRAMUSCULAR | Status: DC | PRN
  Administered 2016-08-09: 08:00:00 via INTRA_ARTERIAL

## 2016-08-09 MED ORDER — FENTANYL CITRATE (PF) 100 MCG/2ML IJ SOLN
INTRAMUSCULAR | Status: AC
  Filled 2016-08-09: qty 2

## 2016-08-09 MED ORDER — IOPAMIDOL (ISOVUE-370) INJECTION 76%
INTRAVENOUS | Status: AC
  Filled 2016-08-09: qty 100

## 2016-08-09 MED ORDER — VERAPAMIL HCL 2.5 MG/ML IV SOLN
INTRAVENOUS | Status: AC
  Filled 2016-08-09: qty 2

## 2016-08-09 MED ORDER — SODIUM CHLORIDE 0.9% FLUSH: 3.0000 mL | INTRAVENOUS | Status: DC | PRN

## 2016-08-09 MED ORDER — HEPARIN (PORCINE) IN NACL 2-0.9 UNIT/ML-% IJ SOLN
INTRAMUSCULAR | Status: DC | PRN
  Administered 2016-08-09: 08:00:00

## 2016-08-09 MED ORDER — IOPAMIDOL (ISOVUE-370) INJECTION 76%
INTRAVENOUS | Status: DC | PRN
  Administered 2016-08-09: 50 mL via INTRA_ARTERIAL

## 2016-08-09 MED ORDER — MIDAZOLAM HCL 2 MG/2ML IJ SOLN
INTRAMUSCULAR | Status: DC | PRN
  Administered 2016-08-09: 2 mg via INTRAVENOUS

## 2016-08-09 MED ORDER — HEPARIN (PORCINE) IN NACL 2-0.9 UNIT/ML-% IJ SOLN
INTRAMUSCULAR | Status: AC
  Filled 2016-08-09: qty 1000

## 2016-08-09 MED ORDER — SODIUM CHLORIDE 0.9 % WEIGHT BASED INFUSION: 1.0000 mL/kg/h | INTRAVENOUS | Status: DC

## 2016-08-09 MED ORDER — SODIUM CHLORIDE 0.9 % WEIGHT BASED INFUSION
3.0000 mL/kg/h | INTRAVENOUS | Status: DC
  Administered 2016-08-09: 3 mL/kg/h via INTRAVENOUS

## 2016-08-09 MED ORDER — MIDAZOLAM HCL 2 MG/2ML IJ SOLN
INTRAMUSCULAR | Status: AC
Start: 2016-08-09 — End: ?
  Filled 2016-08-09: qty 2

## 2016-08-09 MED ORDER — HEPARIN SODIUM (PORCINE) 1000 UNIT/ML IJ SOLN
INTRAMUSCULAR | Status: DC | PRN
  Administered 2016-08-09: 5000 [IU] via INTRAVENOUS

## 2016-08-09 SURGICAL SUPPLY — 10 items
CATH EXPO 5F FL3.5 (CATHETERS) ×2 IMPLANT
CATH EXPO 5FR FR4 (CATHETERS) ×2 IMPLANT
DEVICE RAD COMP TR BAND LRG (VASCULAR PRODUCTS) ×2 IMPLANT
GLIDESHEATH SLEND SS 6F .021 (SHEATH) ×2 IMPLANT
GUIDEWIRE INQWIRE 1.5J.035X260 (WIRE) ×1 IMPLANT
INQWIRE 1.5J .035X260CM (WIRE) ×2
KIT HEART LEFT (KITS) ×2 IMPLANT
PACK CARDIAC CATHETERIZATION (CUSTOM PROCEDURE TRAY) ×2 IMPLANT
TRANSDUCER W/STOPCOCK (MISCELLANEOUS) ×2 IMPLANT
TUBING CIL FLEX 10 FLL-RA (TUBING) ×2 IMPLANT

## 2016-08-09 NOTE — Discharge Instructions (Signed)

## 2016-08-09 NOTE — Interval H&P Note (Signed)
Cath Lab Visit (complete for each Cath Lab visit)  Clinical Evaluation Leading to the Procedure:   ACS: No.  Non-ACS:    Anginal Classification: CCS II  Anti-ischemic medical therapy: Minimal Therapy (1 class of medications)  Non-Invasive Test Results: Intermediate-risk stress test findings: cardiac mortality 1-3%/year  Prior CABG: No previous CABG      History and Physical Interval Note:  08/09/2016 7:36 AM  Augustin Schooling  has presented today for surgery, with the diagnosis of cp - abnormal mioview  The various methods of treatment have been discussed with the patient and family. After consideration of risks, benefits and other options for treatment, the patient has consented to  Procedure(s): Left Heart Cath and Coronary Angiography (N/A) as a surgical intervention .  The patient's history has been reviewed, patient examined, no change in status, stable for surgery.  I have reviewed the patient's chart and labs.  Questions were answered to the patient's satisfaction.     Larae Grooms

## 2016-08-09 NOTE — H&P (View-Only) (Signed)
Cardiology Office Note   Date:  08/02/2016   ID:  [name], DOB July 11, 1955, MRN [id number]  PCP:  Cassandra Kirschner, MD  Cardiologist:  Dr. Johnsie Cancel     Chief Complaint  Patient presents with  . Chest Pain      History of Present Illness: [name] is a 61 y.o. female who presents for chest pain with abnormal nuc study and plan for cardiac cath.   Pt with hx of PVC and longish QT interval.  [name] has had myoview and MRI,  myoview in 2014 with normal study, no ischemia or infarction.  Echo at that time EF 55-60%. No RWMA.  Prior cardiac MRI normal RV and LV, EF 53%, moderate LAE.    On 07/16/16 pt with chest pain and seen in ER.  Pain then described as bilateral scapular pain and rt sided chest pain with radiation to rt jaw.  Lasted about 20 min and went away, but then returned.  [name] called EMS.  [name] rec'd asa 325 mg.  CXR was normal. Troponin neg X 2,  Labs normal see below. EKG with SR with PVCs mild lateral ischemia.    [name] was discharged but Dr. Johnsie Cancel was sent info and scheduled myoview. EF was 53%, freq PVCs couplets and triplets.  [name] had defects in basal inferolateral, mid anterior and mid inferolateral  apical anterior, apical lateral and apex location. This defect is fixed with no evidence of ischemia. Consistent with prior infarct.  This was new from prior study.  Dr. Johnsie Cancel would like pt to have cardiac cath.   Today pt tells me [name] has had no further pain.[name]  [name] was at work and developed Rt scapula pain with radiation around to rt chest and into jaw.  No nausea or SOB, and went to ER.   [name] does have FH of CAD with both parents.  On no statins.  Also with neck pain.  Is to have cervical MRI on Monday.  [name] is on mobic.   We discussed cardiac cath procedure and risks and how long out of work.    Past Medical History:  Diagnosis Date  . Allergic rhinitis   . Arthritis   . GERD (gastroesophageal reflux disease)   . Glaucoma   . Venous stasis     Past  Surgical History:  Procedure Laterality Date  . BACK SURGERY     cervical fusion   . CHOLECYSTECTOMY    . COLONOSCOPY     Hx:of  . EXTERNAL FIXATION LEG Left 10/20/2012   Procedure: EXTERNAL FIXATION Left Tibial Plateau Fx;  Surgeon: Rozanna Box, MD;  Location: [place];  Service: Orthopedics;  Laterality: Left;  . EXTERNAL FIXATION REMOVAL Left 11/03/2012   Procedure: REMOVAL EXTERNAL FIXATION LEG;  Surgeon: Rozanna Box, MD;  Location: [facility];  Service: Orthopedics;  Laterality: Left;  . JOINT REPLACEMENT Bilateral    Hips   . ORIF TIBIA PLATEAU Left 11/03/2012   Dr Marcelino Scot  . ORIF TIBIA PLATEAU Left 11/03/2012   Procedure: OPEN REDUCTION INTERNAL FIXATION (ORIF) TIBIAL PLATEAU FRACTURE AND REMOVAL OF EXTERNAL FIXATION ;  Surgeon: Rozanna Box, MD;  Location: [facility];  Service: Orthopedics;  Laterality: Left;  . SHOULDER SURGERY    . TUBAL LIGATION    . vocal cord surgery       Current Outpatient Prescriptions  Medication Sig Dispense Refill  . cyclobenzaprine (FLEXERIL) 5 MG tablet Take by mouth. TAKE 1-2 TABLETS BY MOUTH DAILY  AT BEDTIME    . meloxicam (MOBIC) 15 MG tablet Take 15 mg by mouth daily as needed for pain.    . methocarbamol (ROBAXIN) 500 MG tablet Take 500 mg by mouth 2 (two) times daily.    . metoprolol tartrate (LOPRESSOR) 25 MG tablet TAKE 1/2 TABLET BY MOUTH 2 TIMES DAILY. 30 tablet 2  . ranitidine (ZANTAC) 150 MG tablet Take 150 mg by mouth 2 (two) times daily.    . TRAVATAN Z 0.004 % SOLN ophthalmic solution Place 1 drop into both eyes at bedtime.      No current facility-administered medications for this visit.     Allergies:   Prednisone and Amoxicillin    Social History:  The patient  reports that [name] has never smoked. [name] has never used smokeless tobacco. [name] reports that [name] does not drink alcohol or use drugs.   Family History:  The patient's family history includes Diabetes in her brother; Heart attack in her father and mother.    ROS:   General:no colds or fevers, weight changes on different scales Skin:no rashes or ulcers HEENT:no blurred vision, no congestion CV:see HPI PUL:see HPI GI:no diarrhea constipation or melena, no indigestion GU:no hematuria, no dysuria MS:no joint pain, no claudication cervical disc disease Neuro:no syncope, no lightheadedness Endo:no diabetes, no thyroid disease  Wt Readings from Last 3 Encounters:  08/02/16 235 lb 6.4 oz (106.8 kg)  07/23/16 210 lb (95.3 kg)  07/16/16 210 lb (95.3 kg)     PHYSICAL EXAM: VS:  BP (!) 104/50   Pulse 72   Ht 5\' 3"  (1.6 m)   Wt 235 lb 6.4 oz (106.8 kg)   BMI 41.70 kg/m  , BMI Body mass index is 41.7 kg/m. General:Pleasant affect, NAD Skin:Warm and dry, brisk capillary refill HEENT:normocephalic, sclera clear, mucus membranes moist Neck:supple, no JVD, no bruits  Heart:S1S2 RRR without murmur, gallup, rub or click Lungs:clear without rales, rhonchi, or wheezes WCB:JSEGB, soft, non tender, + BS, do not palpate liver spleen or masses Ext:no lower ext edema, 2+ pedal pulses, 2+ radial pulses Neuro:alert and oriented x 3, MAE, follows commands, + facial symmetry    EKG:  EKG is ordered today. The ekg ordered today demonstrates SR with PVCs Qtc 503 ms.  Less than ER.  No acute changes.     Recent Labs: 07/16/2016: ALT 20; BUN 11; Creatinine, Ser 1.00; Hemoglobin 13.9; Platelets 191; Potassium 3.9; Sodium 135    Lipid Panel    Component Value Date/Time   CHOL 172 11/25/2013 0704   TRIG 260 (H) 11/25/2013 0704   HDL 34 (L) 11/25/2013 0704   CHOLHDL 5.1 11/25/2013 0704   VLDL 52 (H) 11/25/2013 0704   LDLCALC 86 11/25/2013 0704       Other studies Reviewed: Additional studies/ records that were reviewed today include: .  Nuc study 07/25/16 Study Highlights    Nuclear stress EF: 53%.  There was less than 43mm of downsloping ST segment depression during infusion with frequent PVCs, ventricular couplets and triplets.  There is a defect  present in the basal inferolateral, mid anterior, mid inferolateral, apical anterior, apical lateral and apex location. This defect is fixed with no evidence of ischemia. Consistent with prior infarct.  This is an intermediate risk study.  The left ventricular ejection fraction is mildly decreased (45-54%).     MRI:  05/30/14  Normal  IMPRESSION: 1) Normal RV size and function no dysplasia  2) Normal LV size and function EF 53%  3)  No delayed gadolinium uptake on inversion recovery sequences  4) Moderate LAE  ASSESSMENT AND PLAN:  1.  Angina with Rt side chest pain.  And abnormal nuc study.  Will proceed with cardiac cath.  Pt agreeable.  Discussed at length.  [name] will begin ASA 81 mg daily.  Will check lipids today with labs, non fasting.  The patient understands that risks included but are not limited to stroke (1 in 1000), death (1 in 68), kidney failure [usually temporary] (1 in 500), bleeding (1 in 200), allergic reaction [possibly serious] (1 in 200).   2.  Prolonged Qtc, + PVCs has had this for some time.  3. Cervical disc pain for MRI  , + Arthritis.    4. Lipids check today.     Current medicines are reviewed with the patient today.  The patient Has no concerns regarding medicines.  The following changes have been made:  See above Labs/ tests ordered today include:see above  Disposition:   FU:  see above  Signed, Cecilie Kicks, NP  08/02/2016 11:57 AM    [facility] [address], [place], [place], [place] Phone: [phone]; Fax: [phone]

## 2016-08-29 ENCOUNTER — Encounter: Payer: Self-pay | Admitting: Cardiology

## 2016-08-29 ENCOUNTER — Ambulatory Visit (INDEPENDENT_AMBULATORY_CARE_PROVIDER_SITE_OTHER): Payer: 59 | Admitting: Cardiology

## 2016-08-29 VITALS — BP 124/78 | HR 94 | Ht 63.0 in | Wt 232.4 lb

## 2016-08-29 DIAGNOSIS — R9439 Abnormal result of other cardiovascular function study: Secondary | ICD-10-CM

## 2016-08-29 DIAGNOSIS — Z9889 Other specified postprocedural states: Secondary | ICD-10-CM | POA: Diagnosis not present

## 2016-08-29 DIAGNOSIS — I493 Ventricular premature depolarization: Secondary | ICD-10-CM | POA: Diagnosis not present

## 2016-08-29 MED ORDER — METOPROLOL TARTRATE 25 MG PO TABS: 12.5000 mg | ORAL_TABLET | Freq: Two times a day (BID) | ORAL | 3 refills | Status: DC

## 2016-08-29 NOTE — Patient Instructions (Signed)
Medication Instructions:  Your physician recommends that you continue on your current medications as directed. Please refer to the Current Medication list given to you today.   Labwork: None Ordered   Testing/Procedures: None Ordered   Follow-Up: Your physician recommends that you schedule a follow-up appointment in: keep your follow up appointment with Dr. Johnsie Cancel.   Any Other Special Instructions Will Be Listed Below (If Applicable).     If you need a refill on your cardiac medications before your next appointment, please call your pharmacy.

## 2016-08-29 NOTE — Progress Notes (Signed)
Cardiology Office Note   Date:  08/29/2016   ID:  [name], DOB [birth year], MRN [id number]  PCP:  Mikey Kirschner, MD  Cardiologist:  Dr. Johnsie Cancel    Chief Complaint  Patient presents with  . Hospitalization Follow-up    post cath      History of Present Illness: [name] is a 61 y.o. female who presents for post procedure cardiac cath.  EF 50-59%  No angiographically apparent CAD.     Pt with hx of PVC and longish QT interval.  She has had myoview and MRI,  myoview in 2014 with normal study, no ischemia or infarction.  Echo at that time EF 55-60%. No RWMA.  Prior cardiac MRI normal RV and LV, EF 53%, moderate LAE.    On 07/16/16 pt with chest pain and seen in ER.  Pain then described as bilateral scapular pain and rt sided chest pain with radiation to rt jaw.  Lasted about 20 min and went away, but then returned.  She called EMS.  She rec'd asa 325 mg.  CXR was normal. Troponin neg X 2,  Labs normal see below. EKG with SR with PVCs mild lateral ischemia.    She was discharged but Dr. Johnsie Cancel was sent info and scheduled myoview. EF was 53%, freq PVCs couplets and triplets.  She had defects in basal inferolateral, mid anterior and mid inferolateral  apical anterior, apical lateral and apex location. This defect is fixed with no evidence of ischemia. Consistent with prior infarct.  This was new from prior study.  At that time the above cath was scheduled to evaluate further.    Today she is doing well no chest pain and no SOB.  She will need neck surgery in future.  She is low cardiac risk.  She is to see Dr. Johnsie Cancel next week which she would like to keep.   She was reassured about CAD.    Past Medical History:  Diagnosis Date  . Allergic rhinitis   . Arthritis   . GERD (gastroesophageal reflux disease)   . Glaucoma   . Venous stasis     Past Surgical History:  Procedure Laterality Date  . BACK SURGERY     cervical fusion   . CHOLECYSTECTOMY    .  COLONOSCOPY     Hx:of  . EXTERNAL FIXATION LEG Left 10/20/2012   Procedure: EXTERNAL FIXATION Left Tibial Plateau Fx;  Surgeon: Rozanna Box, MD;  Location: [place];  Service: Orthopedics;  Laterality: Left;  . EXTERNAL FIXATION REMOVAL Left 11/03/2012   Procedure: REMOVAL EXTERNAL FIXATION LEG;  Surgeon: Rozanna Box, MD;  Location: [facility];  Service: Orthopedics;  Laterality: Left;  . JOINT REPLACEMENT Bilateral    Hips   . LEFT HEART CATH AND CORONARY ANGIOGRAPHY N/A 08/09/2016   Procedure: Left Heart Cath and Coronary Angiography;  Surgeon: Jettie Booze, MD;  Location: [place] [facility];  Service: Cardiovascular;  Laterality: N/A;  . ORIF TIBIA PLATEAU Left 11/03/2012   Dr Marcelino Scot  . ORIF TIBIA PLATEAU Left 11/03/2012   Procedure: OPEN REDUCTION INTERNAL FIXATION (ORIF) TIBIAL PLATEAU FRACTURE AND REMOVAL OF EXTERNAL FIXATION ;  Surgeon: Rozanna Box, MD;  Location: [facility];  Service: Orthopedics;  Laterality: Left;  . SHOULDER SURGERY    . TUBAL LIGATION    . vocal cord surgery       Current Outpatient Prescriptions  Medication Sig Dispense Refill  . aspirin EC 81 MG tablet Take  1 tablet (81 mg total) by mouth daily.    Marland Kitchen atorvastatin (LIPITOR) 40 MG tablet Take 1 tablet (40 mg total) by mouth daily. 90 tablet 3  . cyclobenzaprine (FLEXERIL) 5 MG tablet Take 5-10 mg by mouth at bedtime. TAKE 1-2 TABLETS BY MOUTH DAILY AT BEDTIME     . meloxicam (MOBIC) 15 MG tablet Take 15 mg by mouth daily as needed for pain.    . methocarbamol (ROBAXIN) 500 MG tablet Take 500 mg by mouth 2 (two) times daily.    . metoprolol tartrate (LOPRESSOR) 25 MG tablet Take 0.5 tablets (12.5 mg total) by mouth 2 (two) times daily. 90 tablet 3  . ranitidine (ZANTAC) 150 MG tablet Take 150 mg by mouth 2 (two) times daily.    . TRAVATAN Z 0.004 % SOLN ophthalmic solution Place 1 drop into both eyes at bedtime.      No current facility-administered medications for this visit.     Allergies:   Prednisone  and Amoxicillin    Social History:  The patient  reports that she has never smoked. She has never used smokeless tobacco. She reports that she does not drink alcohol or use drugs.   Family History:  The patient's family history includes Diabetes in her brother; Heart attack in her father and mother.    ROS:  General:no colds or fevers, no weight changes CV:see HPI PUL:see HPI MS:no joint pain, no claudication, + neck pain.  Neuro:no syncope, no lightheadedness  Wt Readings from Last 3 Encounters:  08/29/16 232 lb 6.4 oz (105.4 kg)  08/09/16 230 lb (104.3 kg)  08/02/16 235 lb 6.4 oz (106.8 kg)     PHYSICAL EXAM: VS:  BP 124/78   Pulse 94   Ht 5\' 3"  (1.6 m)   Wt 232 lb 6.4 oz (105.4 kg)   LMP  (LMP Unknown)   BMI 41.17 kg/m  , BMI Body mass index is 41.17 kg/m. General:Pleasant affect, NAD Heart:S1S2 RRR without murmur, gallup, rub or click, rt wrist cath site without hematoma Lungs:clear without rales, rhonchi, or wheezes SWN:IOEV, non tender, + BS, do not palpate liver spleen or masses Ext:no lower ext edema, 2+ pedal pulses, 2+ radial pulses Neuro:alert and oriented X 3,    EKG:  EKG is ordered today. The ekg ordered today demonstrates SR no changes except improved QTc   Recent Labs: 07/16/2016: ALT 20 08/02/2016: BUN 10; Creatinine, Ser 0.90; Hemoglobin 13.5; Platelets 207; Potassium 4.6; Sodium 141    Lipid Panel    Component Value Date/Time   CHOL 182 08/02/2016 1252   TRIG 478 (H) 08/02/2016 1252   HDL 33 (L) 08/02/2016 1252   CHOLHDL 5.5 (H) 08/02/2016 1252   CHOLHDL 5.1 11/25/2013 0704   VLDL 52 (H) 11/25/2013 0704   LDLCALC Comment 08/02/2016 1252       Other studies Reviewed: Additional studies/ records that were reviewed today include: . Cardiac cath 08/09/16 Procedures   Left Heart Cath and Coronary Angiography  Conclusion     The left ventricular systolic function is normal.  LV end diastolic pressure is mildly elevated.  The left  ventricular ejection fraction is 50-55% by visual estimate.  There is no aortic valve stenosis.  No angiographically apparent CAD.   Continue aggressive preventive therapy.      ASSESSMENT AND PLAN:  1.  Abnormal nuc study for surgical clearance that led to cardiac cath with nomral Coronary arteries.    2. PVCs no complaints today.  Current medicines are reviewed with the patient today.  The patient Has no concerns regarding medicines.  The following changes have been made:  See above Labs/ tests ordered today include:see above  Disposition:   FU:  see above  Signed, Cecilie Kicks, NP  08/29/2016 2:19 PM    [facility] [place], [place], [place] [place], [place] Phone: [phone]; Fax: [phone]

## 2016-09-03 NOTE — Progress Notes (Signed)
Cardiology Office Note   Date:  09/06/2016   ID:  [name], DOB [birth year], MRN [id number]  PCP:  Mikey Kirschner, MD  Cardiologist:  Dr. Johnsie Cancel    No chief complaint on file.     History of Present Illness: [name] is a 61 y.o. female who presents for f/u of PVC;s and borderline QT prolongation.   Myoview in 2014 with normal study, no ischemia or infarction.  Echo at that time EF 55-60%. No RWMA.  Prior cardiac MRI normal RV and LV, EF 53%, moderate LAE.    On 07/16/16 pt with chest pain and seen in ER.  Pain then described as bilateral scapular pain and rt sided chest pain with radiation to rt jaw.  Lasted about 20 min and went away, but then returned.  She called EMS.  She rec'd asa 325 mg.  CXR was normal. Troponin neg X 2,  Labs normal see below. EKG with SR with PVCs mild lateral ischemia.    She was discharged but f/u myovue thought to be abnormal and cath arranged Reviewed films  Done 08/09/16 by Dr Irish Lack:  Normal cors EF 50-55%  EDP 19 mmHg  Today she is doing well no chest pain and no SOB.  She will need neck surgery in future.  She is low cardiac risk.    Past Medical History:  Diagnosis Date  . Allergic rhinitis   . Arthritis   . GERD (gastroesophageal reflux disease)   . Glaucoma   . Venous stasis     Past Surgical History:  Procedure Laterality Date  . BACK SURGERY     cervical fusion   . CHOLECYSTECTOMY    . COLONOSCOPY     Hx:of  . EXTERNAL FIXATION LEG Left 10/20/2012   Procedure: EXTERNAL FIXATION Left Tibial Plateau Fx;  Surgeon: Rozanna Box, MD;  Location: [facility];  Service: Orthopedics;  Laterality: Left;  . EXTERNAL FIXATION REMOVAL Left 11/03/2012   Procedure: REMOVAL EXTERNAL FIXATION LEG;  Surgeon: Rozanna Box, MD;  Location: [place];  Service: Orthopedics;  Laterality: Left;  . JOINT REPLACEMENT Bilateral    Hips   . LEFT HEART CATH AND CORONARY ANGIOGRAPHY N/A 08/09/2016   Procedure: Left Heart Cath and Coronary  Angiography;  Surgeon: Jettie Booze, MD;  Location: [place] [facility];  Service: Cardiovascular;  Laterality: N/A;  . ORIF TIBIA PLATEAU Left 11/03/2012   Dr Marcelino Scot  . ORIF TIBIA PLATEAU Left 11/03/2012   Procedure: OPEN REDUCTION INTERNAL FIXATION (ORIF) TIBIAL PLATEAU FRACTURE AND REMOVAL OF EXTERNAL FIXATION ;  Surgeon: Rozanna Box, MD;  Location: [facility];  Service: Orthopedics;  Laterality: Left;  . SHOULDER SURGERY    . TUBAL LIGATION    . vocal cord surgery       Current Outpatient Prescriptions  Medication Sig Dispense Refill  . aspirin EC 81 MG tablet Take 1 tablet (81 mg total) by mouth daily.    Marland Kitchen atorvastatin (LIPITOR) 40 MG tablet Take 1 tablet (40 mg total) by mouth daily. 90 tablet 3  . cyclobenzaprine (FLEXERIL) 5 MG tablet Take 5-10 mg by mouth at bedtime. TAKE 1-2 TABLETS BY MOUTH DAILY AT BEDTIME     . meloxicam (MOBIC) 15 MG tablet Take 15 mg by mouth daily as needed for pain.    . methocarbamol (ROBAXIN) 500 MG tablet Take 500 mg by mouth 2 (two) times daily.    . metoprolol tartrate (LOPRESSOR) 25 MG tablet Take 0.5  tablets (12.5 mg total) by mouth 2 (two) times daily. 90 tablet 3  . ranitidine (ZANTAC) 150 MG tablet Take 150 mg by mouth 2 (two) times daily.    . TRAVATAN Z 0.004 % SOLN ophthalmic solution Place 1 drop into both eyes at bedtime.      No current facility-administered medications for this visit.     Allergies:   Prednisone and Amoxicillin    Social History:  The patient  reports that she has never smoked. She has never used smokeless tobacco. She reports that she does not drink alcohol or use drugs.   Family History:  The patient's family history includes Diabetes in her brother; Heart attack in her father and mother.    ROS:  General:no colds or fevers, no weight changes CV:see HPI PUL:see HPI MS:no joint pain, no claudication, + neck pain.  Neuro:no syncope, no lightheadedness  Wt Readings from Last 3 Encounters:  09/06/16 233 lb  6.4 oz (105.9 kg)  08/29/16 232 lb 6.4 oz (105.4 kg)  08/09/16 230 lb (104.3 kg)     PHYSICAL EXAM: VS:  BP 110/70   Pulse 77   Ht 5\' 3"  (1.6 m)   Wt 233 lb 6.4 oz (105.9 kg)   LMP  (LMP Unknown)   SpO2 97%   BMI 41.34 kg/m  , BMI Body mass index is 41.34 kg/m. Affect appropriate Healthy:  appears stated age 50: normal Neck supple with no adenopathy JVP normal no bruits no thyromegaly Lungs clear with no wheezing and good diaphragmatic motion Heart:  S1/S2 no murmur, no rub, gallop or click PMI normal Abdomen: benighn, BS positve, no tenderness, no AAA no bruit.  No HSM or HJR Distal pulses intact with no bruits No edema Neuro non-focal Skin warm and dry No muscular weakness Preserved right radial pulse    EKG:   08/29/16 SR no changes except improved QTc   Recent Labs: 07/16/2016: ALT 20 08/02/2016: BUN 10; Creatinine, Ser 0.90; Hemoglobin 13.5; Platelets 207; Potassium 4.6; Sodium 141    Lipid Panel    Component Value Date/Time   CHOL 182 08/02/2016 1252   TRIG 478 (H) 08/02/2016 1252   HDL 33 (L) 08/02/2016 1252   CHOLHDL 5.5 (H) 08/02/2016 1252   CHOLHDL 5.1 11/25/2013 0704   VLDL 52 (H) 11/25/2013 0704   LDLCALC Comment 08/02/2016 1252       Other studies Reviewed: Additional studies/ records that were reviewed today include: . Cardiac cath 08/09/16 Procedures   Left Heart Cath and Coronary Angiography  Conclusion     The left ventricular systolic function is normal.  LV end diastolic pressure is mildly elevated.  The left ventricular ejection fraction is 50-55% by visual estimate.  There is no aortic valve stenosis.  No angiographically apparent CAD.   Continue aggressive preventive therapy.      ASSESSMENT AND PLAN:  1.  Abnormal nuc study normal cors by cath 08/09/16   2. PVCs no complaints today.  With normal EF and no CAD by cath continue beta blocker   3. Cholesterol:  On statin Triglycerides elevated f/u primary low fat  diet consider tricor   4. Ortho: cervical spine Having nerve conduction study Clear to have repeat cervical spine surgery with Dr Lynann Bologna  if needed.   Current medicines are reviewed with the patient today.  The patient Has no concerns regarding medicines.  Jenkins Rouge, MD

## 2016-09-06 ENCOUNTER — Encounter: Payer: Self-pay | Admitting: Cardiovascular Disease

## 2016-09-06 ENCOUNTER — Ambulatory Visit (INDEPENDENT_AMBULATORY_CARE_PROVIDER_SITE_OTHER): Payer: 59 | Admitting: Cardiovascular Disease

## 2016-09-06 VITALS — BP 110/70 | HR 77 | Ht 63.0 in | Wt 233.4 lb

## 2016-09-06 DIAGNOSIS — I493 Ventricular premature depolarization: Secondary | ICD-10-CM | POA: Diagnosis not present

## 2016-09-06 NOTE — Patient Instructions (Signed)

## 2016-09-16 ENCOUNTER — Other Ambulatory Visit: Payer: 59

## 2016-09-23 ENCOUNTER — Other Ambulatory Visit: Payer: Self-pay | Admitting: Cardiovascular Disease

## 2017-05-19 ENCOUNTER — Emergency Department (HOSPITAL_COMMUNITY)
Admission: EM | Admit: 2017-05-19 | Discharge: 2017-05-19 | Disposition: A | Discharge status: Home / Self Care | Payer: 59 | Attending: Emergency Medicine | Admitting: Emergency Medicine

## 2017-05-19 ENCOUNTER — Encounter (HOSPITAL_COMMUNITY): Payer: Self-pay | Admitting: Emergency Medicine

## 2017-05-19 ENCOUNTER — Other Ambulatory Visit: Payer: Self-pay

## 2017-05-19 ENCOUNTER — Emergency Department (HOSPITAL_COMMUNITY): Payer: 59

## 2017-05-19 DIAGNOSIS — W010XXA Fall on same level from slipping, tripping and stumbling without subsequent striking against object, initial encounter: Secondary | ICD-10-CM | POA: Insufficient documentation

## 2017-05-19 DIAGNOSIS — Y999 Unspecified external cause status: Secondary | ICD-10-CM | POA: Diagnosis not present

## 2017-05-19 DIAGNOSIS — Y9301 Activity, walking, marching and hiking: Secondary | ICD-10-CM | POA: Diagnosis not present

## 2017-05-19 DIAGNOSIS — Y92007 Garden or yard of unspecified non-institutional (private) residence as the place of occurrence of the external cause: Secondary | ICD-10-CM | POA: Insufficient documentation

## 2017-05-19 DIAGNOSIS — S59911A Unspecified injury of right forearm, initial encounter: Secondary | ICD-10-CM | POA: Diagnosis present

## 2017-05-19 DIAGNOSIS — Z7982 Long term (current) use of aspirin: Secondary | ICD-10-CM | POA: Diagnosis not present

## 2017-05-19 DIAGNOSIS — Z96643 Presence of artificial hip joint, bilateral: Secondary | ICD-10-CM | POA: Insufficient documentation

## 2017-05-19 DIAGNOSIS — Z79899 Other long term (current) drug therapy: Secondary | ICD-10-CM | POA: Insufficient documentation

## 2017-05-19 DIAGNOSIS — S52501A Unspecified fracture of the lower end of right radius, initial encounter for closed fracture: Secondary | ICD-10-CM | POA: Insufficient documentation

## 2017-05-19 MED ORDER — OXYCODONE-ACETAMINOPHEN 5-325 MG PO TABS: 1.0000 | ORAL_TABLET | ORAL | 0 refills | Status: DC | PRN

## 2017-05-19 MED ORDER — OXYCODONE-ACETAMINOPHEN 5-325 MG PO TABS
1.0000 | ORAL_TABLET | Freq: Once | ORAL | Status: AC
  Administered 2017-05-19: 1 via ORAL
  Filled 2017-05-19: qty 1

## 2017-05-19 NOTE — Discharge Instructions (Addendum)
Use ice and elevation as much as possible for the next several days.  You may take the oxycodone prescribed for pain relief.  This will make you drowsy - do not drive within 4 hours of taking this medication. Return here or call your orthopedist for any new symptoms such as numbness in your fingers, color changes or worsened pain.

## 2017-05-19 NOTE — ED Triage Notes (Signed)
Pt fell backwards tripping over something. Pt has deformity noted to right wrist.

## 2017-05-19 NOTE — ED Provider Notes (Signed)
[facility] EMERGENCY DEPARTMENT Provider Note   CSN: [id number] Arrival date & time: 05/19/17  1454     History   Chief Complaint Chief Complaint  Patient presents with  . Arm Pain    HPI [name] is a 62 y.o. right handed female with past medical history as outlined below presenting with right wrist pain and swelling.  She was walking backward outdoors when she tripped over an object just before arrival, fell backward and tried to catch herself with the outstretched right hand causing pain and swelling in the wrist. She denies other injury including head or neck injury.  She also denies numbness or weakness in her hand or fingertips.  She has had no treatment prior to arrival today.  Pt states is currently an active patient of Dr. Rhona Raider, receiving treatment for knee pain issues.  HPI  Past Medical History:  Diagnosis Date  . Allergic rhinitis   . Arthritis   . GERD (gastroesophageal reflux disease)   . Glaucoma   . Venous stasis     Patient Active Problem List   Diagnosis Date Noted  . Abnormal stress test   . 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 Glaucoma   . PVC (premature ventricular contraction) 10/21/2012  . Closed fracture of left tibial plateau 10/20/2012  . Fall 10/20/2012  . Obesity (BMI 30-39.9) 10/20/2012  . Preop cardiovascular exam 10/20/2012  . GERD (gastroesophageal reflux disease)   . Dyspnea 10/06/2012    Past Surgical History:  Procedure Laterality Date  . BACK SURGERY     cervical fusion   . CHOLECYSTECTOMY    . COLONOSCOPY     Hx:of  . EXTERNAL FIXATION LEG Left 10/20/2012   Procedure: EXTERNAL FIXATION Left Tibial Plateau Fx;  Surgeon: Rozanna Box, MD;  Location: [place];  Service: Orthopedics;  Laterality: Left;  . EXTERNAL FIXATION REMOVAL Left 11/03/2012   Procedure: REMOVAL EXTERNAL FIXATION LEG;  Surgeon: Rozanna Box, MD;  Location: [place];  Service: Orthopedics;  Laterality: Left;  . JOINT REPLACEMENT Bilateral    Hips     . LEFT HEART CATH AND CORONARY ANGIOGRAPHY N/A 08/09/2016   Procedure: Left Heart Cath and Coronary Angiography;  Surgeon: Jettie Booze, MD;  Location: [place] [facility];  Service: Cardiovascular;  Laterality: N/A;  . ORIF TIBIA PLATEAU Left 11/03/2012   Dr Marcelino Scot  . ORIF TIBIA PLATEAU Left 11/03/2012   Procedure: OPEN REDUCTION INTERNAL FIXATION (ORIF) TIBIAL PLATEAU FRACTURE AND REMOVAL OF EXTERNAL FIXATION ;  Surgeon: Rozanna Box, MD;  Location: [place];  Service: Orthopedics;  Laterality: Left;  . SHOULDER SURGERY    . TUBAL LIGATION    . vocal cord surgery       OB History   None      Home Medications    Prior to Admission medications   Medication Sig Start Date End Date Taking? Authorizing Provider  aspirin EC 81 MG tablet Take 1 tablet (81 mg total) by mouth daily. 08/02/16   Isaiah Serge, NP  atorvastatin (LIPITOR) 40 MG tablet Take 1 tablet (40 mg total) by mouth daily. 08/05/16 11/03/16  Isaiah Serge, NP  cyclobenzaprine (FLEXERIL) 5 MG tablet Take 5-10 mg by mouth at bedtime. TAKE 1-2 TABLETS BY MOUTH DAILY AT BEDTIME     [provider]  meloxicam (MOBIC) 15 MG tablet Take 15 mg by mouth daily as needed for pain.    [provider]  methocarbamol (ROBAXIN) 500 MG tablet  Take 500 mg by mouth 2 (two) times daily.    [provider]  metoprolol tartrate (LOPRESSOR) 25 MG tablet TAKE 1/2 TABLET BY MOUTH 2 TIMES DAILY. 09/23/16   Josue Hector, MD  oxyCODONE-acetaminophen (PERCOCET/ROXICET) 5-325 MG tablet Take 1 tablet by mouth every 4 (four) hours as needed. 05/19/17   Evalee Jefferson, PA-C  ranitidine (ZANTAC) 150 MG tablet Take 150 mg by mouth 2 (two) times daily.    [provider]  TRAVATAN Z 0.004 % SOLN ophthalmic solution Place 1 drop into both eyes at bedtime.  07/29/12   [provider]    Family History Family History  Problem Relation Age of Onset  . Heart attack Father   . Heart attack Mother   . Diabetes  Brother     Social History Social History   Tobacco Use  . Smoking status: Never Smoker  . Smokeless tobacco: Never Used  Substance Use Topics  . Alcohol use: No  . Drug use: No     Allergies   Prednisone and Amoxicillin   Review of Systems Review of Systems  Constitutional: Negative for fever.  Musculoskeletal: Positive for arthralgias and joint swelling. Negative for myalgias.  Neurological: Negative for weakness and numbness.     Physical Exam Updated Vital Signs BP (!) 148/65 (BP Location: Right Arm)   Pulse (!) 114   Temp 98.4 F (36.9 C) (Oral)   Resp 16   Ht 5\' 3"  (1.6 m)   Wt 99.8 kg (220 lb)   LMP  (LMP Unknown)   SpO2 91%   BMI 38.97 kg/m   Physical Exam  Constitutional: She appears well-developed and well-nourished.  HENT:  Head: Atraumatic.  Neck: Normal range of motion.  Cardiovascular:  Pulses:      Radial pulses are 2+ on the right side, and 2+ on the left side.  Pulses equal bilaterally  Musculoskeletal: She exhibits edema and tenderness.       Right wrist: She exhibits bony tenderness, swelling and deformity.  Neurological: She is alert. She has normal strength. She displays normal reflexes. No sensory deficit.  Skin: Skin is warm and dry.  Skin intact.  Psychiatric: She has a normal mood and affect.     ED Treatments / Results  Labs (all labs ordered are listed, but only abnormal results are displayed) Labs Reviewed - No data to display  EKG None  Radiology Dg Wrist Complete Right  Result Date: 05/19/2017 CLINICAL DATA:  Right wrist pain after fall. EXAM: RIGHT WRIST - COMPLETE 3+ VIEW COMPARISON:  None. FINDINGS: Severely comminuted and displaced fracture is seen involving the distal right radius. No other fracture is noted. No soft tissue abnormality is noted. IMPRESSION: Severely comminuted and posteriorly displaced distal right radial fracture is noted. Electronically Signed   By: Marijo Conception, M.D.   On: 05/19/2017 16:02      Procedures Procedures (including critical care time)  SPLINT APPLICATION Date/Time: 4:01 PM Authorized by: Evalee Jefferson Consent: Verbal consent obtained. Risks and benefits: risks, benefits and alternatives were discussed Consent given by: patient Splint applied by: RN Location details: right forearm Splint type: sugar tong Supplies used: webril, fiberglass, ace, sling. Post-procedure: The splinted body part was neurovascularly unchanged following the procedure. Patient tolerance: Patient tolerated the procedure well with no immediate complications.     Medications Ordered in ED Medications  oxyCODONE-acetaminophen (PERCOCET/ROXICET) 5-325 MG per tablet 1 tablet (1 tablet Oral Given 05/19/17 1630)     Initial Impression /  Assessment and Plan / ED Course  I have reviewed the triage vital signs and the nursing notes.  Pertinent labs & imaging results that were available during my care of the patient were reviewed by me and considered in my medical decision making (see chart for details).     Discussed case with Dr. Jerald Kief PA Mitzi Hansen who reviewed films with Dr. Rhona Raider.  Recommended pt f/u with Dr. Grandville Silos within the next several days given the degree of fracture. Advised sugar tong, sling.  Oxycodone prescribed, splint care instructions given.  Strict return precautions also outlined.  Pt to call for appt with [facility] asap.    Pt understands and agrees with plan.  Final Clinical Impressions(s) / ED Diagnoses   Final diagnoses:  Closed fracture of distal end of right radius, unspecified fracture morphology, initial encounter    ED Discharge Orders        Ordered    oxyCODONE-acetaminophen (PERCOCET/ROXICET) 5-325 MG tablet  Every 4 hours PRN     05/19/17 1646       Evalee Jefferson, PA-C 05/19/17 1659    Virgel Manifold, MD 05/19/17 1857

## 2017-05-20 ENCOUNTER — Other Ambulatory Visit: Payer: Self-pay | Admitting: Orthopedic Surgery

## 2017-05-21 ENCOUNTER — Encounter (HOSPITAL_COMMUNITY): Payer: Self-pay | Admitting: *Deleted

## 2017-05-21 ENCOUNTER — Other Ambulatory Visit: Payer: Self-pay

## 2017-05-21 NOTE — Progress Notes (Signed)
Spoke with pt for pre-op call. Pt states the only heart issue she has is "skipped beats". Pt is treated with Metoprolol. Denies HTN or Diabetes.

## 2017-05-22 NOTE — Anesthesia Preprocedure Evaluation (Addendum)
Anesthesia Evaluation  Patient identified by MRN, date of birth, ID band Patient awake    Reviewed: Allergy & Precautions, NPO status , Patient's Chart, lab work & pertinent test results  History of Anesthesia Complications Negative for: history of anesthetic complications  Airway Mallampati: III  TM Distance: >3 FB Neck ROM: Full    Dental  (+) Dental Advisory Given   Pulmonary neg pulmonary ROS,    breath sounds clear to auscultation       Cardiovascular hypertension, Pt. on medications (-) angina Rhythm:Regular Rate:Normal  '18 Cath: no coronary disease, no aortic stenosis, normal LVF '14 Myoview: no ischemia, normal LVF   Neuro/Psych negative neurological ROS     GI/Hepatic Neg liver ROS, GERD  Medicated and Controlled,  Endo/Other  Morbid obesity  Renal/GU negative Renal ROS     Musculoskeletal  (+) Arthritis ,   Abdominal (+) + obese,   Peds  Hematology negative hematology ROS (+)   Anesthesia Other Findings   Reproductive/Obstetrics                            Anesthesia Physical Anesthesia Plan  ASA: II  Anesthesia Plan: General and Regional   Post-op Pain Management: GA combined w/ Regional for post-op pain   Induction: Intravenous  PONV Risk Score and Plan: 3 and Ondansetron and Dexamethasone  Airway Management Planned: LMA  Additional Equipment:   Intra-op Plan:   Post-operative Plan:   Informed Consent: I have reviewed the patients History and Physical, chart, labs and discussed the procedure including the risks, benefits and alternatives for the proposed anesthesia with the patient or authorized representative who has indicated his/her understanding and acceptance.   Dental advisory given  Plan Discussed with: Surgeon and CRNA  Anesthesia Plan Comments: (Plan routine monitors, GA- LMA OK, supraclavicular block for post op analgesia)        Anesthesia  Quick Evaluation

## 2017-05-22 NOTE — H&P (Signed)
[name] is an 62 y.o. female.   CC / Reason for Visit: Right wrist injury HPI: This patient is a 62 year old RHD female who presents for evaluation of a right wrist injury that occurred when she tripped over a milk crate and fell onto an outstretched hand.  She was evaluated where x-rays were obtained and sugar tong splint applied.  She has done reasonably well, obviously with some pain, using intermittently prescribed pain medications  Past Medical History:  Diagnosis Date  . Allergic rhinitis   . Arthritis   . GERD (gastroesophageal reflux disease)   . Glaucoma   . Skipped heart beats   . Venous stasis     Past Surgical History:  Procedure Laterality Date  . BACK SURGERY     cervical fusion   . CHOLECYSTECTOMY    . COLONOSCOPY     Hx:of  . EXTERNAL FIXATION LEG Left 10/20/2012   Procedure: EXTERNAL FIXATION Left Tibial Plateau Fx;  Surgeon: Rozanna Box, MD;  Location: [place];  Service: Orthopedics;  Laterality: Left;  . EXTERNAL FIXATION REMOVAL Left 11/03/2012   Procedure: REMOVAL EXTERNAL FIXATION LEG;  Surgeon: Rozanna Box, MD;  Location: [place];  Service: Orthopedics;  Laterality: Left;  . JOINT REPLACEMENT Bilateral    Hips   . LEFT HEART CATH AND CORONARY ANGIOGRAPHY N/A 08/09/2016   Procedure: Left Heart Cath and Coronary Angiography;  Surgeon: Jettie Booze, MD;  Location: [facility];  Service: Cardiovascular;  Laterality: N/A;  . ORIF TIBIA PLATEAU Left 11/03/2012   Dr Marcelino Scot  . ORIF TIBIA PLATEAU Left 11/03/2012   Procedure: OPEN REDUCTION INTERNAL FIXATION (ORIF) TIBIAL PLATEAU FRACTURE AND REMOVAL OF EXTERNAL FIXATION ;  Surgeon: Rozanna Box, MD;  Location: [place];  Service: Orthopedics;  Laterality: Left;  . SHOULDER SURGERY    . TUBAL LIGATION    . vocal cord surgery      Family History  Problem Relation Age of Onset  . Heart attack Father   . Heart attack Mother   . Diabetes Brother    Social History:  reports that she has  never smoked. She has never used smokeless tobacco. She reports that she does not drink alcohol or use drugs.  Allergies:  Allergies  Allergen Reactions  . Prednisone Shortness Of Breath  . Amoxicillin Rash and Other (See Comments)    Has patient had a PCN reaction causing immediate rash, facial/tongue/throat swelling, SOB or lightheadedness with hypotension: No Has patient had a PCN reaction causing severe rash involving mucus membranes or skin necrosis: No Has patient had a PCN reaction that required hospitalization: No Has patient had a PCN reaction occurring within the last 10 years: No If all of the above answers are "NO", then may proceed with Cephalosporin use.     No medications prior to admission.    No results found for this or any previous visit (from the past 48 hour(s)). No results found.  Review of Systems  All other systems reviewed and are negative.   There were no vitals taken for this visit. Physical Exam  Constitutional:  WD, WN, NAD HEENT:  NCAT, EOMI Neuro/Psych:  Alert & oriented to person, place, and time; appropriate mood & affect Lymphatic: No generalized UE edema or lymphadenopathy Extremities / MSK:  Both UE are normal with respect to appearance, ranges of motion, joint stability, muscle strength/tone, sensation, & perfusion except as otherwise noted:  The right hand is in a sugar tong splint,  without lesions at the periphery.  Intact light touch sensibility in the radial, median, and ulnar nerve distributions with intact motor to the same  Labs / Xrays:  No radiographic studies obtained today.  Injury x-rays are reviewed revealing a comminuted intra-articular dorsally tilted and displaced fracture  Assessment: Closed right comminuted dorsally tilted and displaced distal radius fracture  Plan:  I discussed these findings with her and recommendations for open treatment, given the degree of dorsal tilt and dorsal translation.  The details of the  operative procedure were discussed with the patient.  Questions were invited and answered.  In addition to the goal of the procedure, the risks of the procedure to include but not limited to bleeding; infection; damage to the nerves or blood vessels that could result in bleeding, numbness, weakness, chronic pain, and the need for additional procedures; stiffness; the need for revision surgery; and anesthetic risks were reviewed.  No specific outcome was guaranteed or implied.  Informed consent was obtained.  We will plan to proceed tomorrow.  We also discussed pain management strategies, and some additional oxycodone was prescribed.  [name], MD 05/22/2017, 8:02 PM

## 2017-05-23 ENCOUNTER — Ambulatory Visit (HOSPITAL_COMMUNITY): Payer: 59 | Admitting: Certified Registered"

## 2017-05-23 ENCOUNTER — Ambulatory Visit (HOSPITAL_COMMUNITY)
Admission: RE | Admit: 2017-05-23 | Discharge: 2017-05-23 | Disposition: A | Discharge status: Home / Self Care | Payer: 59 | Source: Ambulatory Visit | Attending: Orthopedic Surgery | Admitting: Orthopedic Surgery

## 2017-05-23 ENCOUNTER — Encounter (HOSPITAL_COMMUNITY)
Admission: RE | Disposition: A | Discharge status: Home / Self Care | Payer: Self-pay | Source: Ambulatory Visit | Attending: Orthopedic Surgery

## 2017-05-23 ENCOUNTER — Encounter (HOSPITAL_COMMUNITY): Payer: Self-pay | Admitting: *Deleted

## 2017-05-23 ENCOUNTER — Ambulatory Visit (HOSPITAL_COMMUNITY): Payer: 59

## 2017-05-23 ENCOUNTER — Other Ambulatory Visit: Payer: Self-pay

## 2017-05-23 DIAGNOSIS — W010XXA Fall on same level from slipping, tripping and stumbling without subsequent striking against object, initial encounter: Secondary | ICD-10-CM | POA: Diagnosis not present

## 2017-05-23 DIAGNOSIS — Y929 Unspecified place or not applicable: Secondary | ICD-10-CM | POA: Insufficient documentation

## 2017-05-23 DIAGNOSIS — Z6838 Body mass index (BMI) 38.0-38.9, adult: Secondary | ICD-10-CM | POA: Diagnosis not present

## 2017-05-23 DIAGNOSIS — S52571A Other intraarticular fracture of lower end of right radius, initial encounter for closed fracture: Secondary | ICD-10-CM | POA: Insufficient documentation

## 2017-05-23 DIAGNOSIS — I10 Essential (primary) hypertension: Secondary | ICD-10-CM | POA: Insufficient documentation

## 2017-05-23 DIAGNOSIS — K219 Gastro-esophageal reflux disease without esophagitis: Secondary | ICD-10-CM | POA: Insufficient documentation

## 2017-05-23 DIAGNOSIS — Z79899 Other long term (current) drug therapy: Secondary | ICD-10-CM | POA: Insufficient documentation

## 2017-05-23 DIAGNOSIS — Z419 Encounter for procedure for purposes other than remedying health state, unspecified: Secondary | ICD-10-CM

## 2017-05-23 HISTORY — PX: OPEN REDUCTION INTERNAL FIXATION (ORIF) DISTAL RADIAL FRACTURE: SHX5989

## 2017-05-23 HISTORY — DX: Conduction disorder, unspecified: I45.9

## 2017-05-23 LAB — CBC
HCT: 38.7 % (ref 36.0–46.0)
HEMOGLOBIN: 12.9 g/dL (ref 12.0–15.0)
MCH: 30.2 pg (ref 26.0–34.0)
MCHC: 33.3 g/dL (ref 30.0–36.0)
MCV: 90.6 fL (ref 78.0–100.0)
PLATELETS: 175 10*3/uL (ref 150–400)
RBC: 4.27 MIL/uL (ref 3.87–5.11)
RDW: 13.2 % (ref 11.5–15.5)
WBC: 6.7 10*3/uL (ref 4.0–10.5)

## 2017-05-23 LAB — BASIC METABOLIC PANEL
ANION GAP: 9 (ref 5–15)
BUN: 11 mg/dL (ref 6–20)
CALCIUM: 8.8 mg/dL — AB (ref 8.9–10.3)
CHLORIDE: 102 mmol/L (ref 101–111)
CO2: 26 mmol/L (ref 22–32)
CREATININE: 0.99 mg/dL (ref 0.44–1.00)
GFR calc non Af Amer: 59 mL/min — ABNORMAL LOW (ref >60)
Glucose, Bld: 112 mg/dL — ABNORMAL HIGH (ref 65–99)
Potassium: 4.2 mmol/L (ref 3.5–5.1)
SODIUM: 137 mmol/L (ref 135–145)

## 2017-05-23 SURGERY — OPEN REDUCTION INTERNAL FIXATION (ORIF) DISTAL RADIUS FRACTURE
Anesthesia: General | Site: Wrist | Laterality: Right

## 2017-05-23 MED ORDER — HYDROMORPHONE HCL 2 MG/ML IJ SOLN: 0.3000 mg | INTRAMUSCULAR | Status: DC | PRN

## 2017-05-23 MED ORDER — PROPOFOL 10 MG/ML IV BOLUS
INTRAVENOUS | Status: DC | PRN
  Administered 2017-05-23: 200 mg via INTRAVENOUS

## 2017-05-23 MED ORDER — LIDOCAINE 2% (20 MG/ML) 5 ML SYRINGE
INTRAMUSCULAR | Status: DC | PRN
  Administered 2017-05-23: 40 mg via INTRAVENOUS

## 2017-05-23 MED ORDER — LIDOCAINE 2% (20 MG/ML) 5 ML SYRINGE
INTRAMUSCULAR | Status: AC
  Filled 2017-05-23: qty 5

## 2017-05-23 MED ORDER — LACTATED RINGERS IV SOLN
INTRAVENOUS | Status: DC
  Administered 2017-05-23 (×2): via INTRAVENOUS

## 2017-05-23 MED ORDER — CLINDAMYCIN PHOSPHATE 900 MG/50ML IV SOLN
900.0000 mg | INTRAVENOUS | Status: AC
  Administered 2017-05-23: 900 mg via INTRAVENOUS
  Filled 2017-05-23: qty 50

## 2017-05-23 MED ORDER — MEPERIDINE HCL 50 MG/ML IJ SOLN: 6.2500 mg | INTRAMUSCULAR | Status: DC | PRN

## 2017-05-23 MED ORDER — PHENYLEPHRINE 40 MCG/ML (10ML) SYRINGE FOR IV PUSH (FOR BLOOD PRESSURE SUPPORT)
PREFILLED_SYRINGE | INTRAVENOUS | Status: AC
  Filled 2017-05-23: qty 20

## 2017-05-23 MED ORDER — 0.9 % SODIUM CHLORIDE (POUR BTL) OPTIME
TOPICAL | Status: DC | PRN
  Administered 2017-05-23: 1000 mL

## 2017-05-23 MED ORDER — PROPOFOL 10 MG/ML IV BOLUS
INTRAVENOUS | Status: AC
  Filled 2017-05-23: qty 20

## 2017-05-23 MED ORDER — ONDANSETRON HCL 4 MG/2ML IJ SOLN
INTRAMUSCULAR | Status: DC | PRN
  Administered 2017-05-23: 4 mg via INTRAVENOUS

## 2017-05-23 MED ORDER — FENTANYL CITRATE (PF) 250 MCG/5ML IJ SOLN
INTRAMUSCULAR | Status: AC
  Filled 2017-05-23: qty 5

## 2017-05-23 MED ORDER — MIDAZOLAM HCL 2 MG/2ML IJ SOLN
INTRAMUSCULAR | Status: AC
  Filled 2017-05-23: qty 2

## 2017-05-23 MED ORDER — BUPIVACAINE HCL (PF) 0.25 % IJ SOLN
INTRAMUSCULAR | Status: AC
  Filled 2017-05-23: qty 30

## 2017-05-23 MED ORDER — PHENYLEPHRINE 40 MCG/ML (10ML) SYRINGE FOR IV PUSH (FOR BLOOD PRESSURE SUPPORT)
PREFILLED_SYRINGE | INTRAVENOUS | Status: DC | PRN
  Administered 2017-05-23: 80 ug via INTRAVENOUS
  Administered 2017-05-23: 120 ug via INTRAVENOUS

## 2017-05-23 MED ORDER — MIDAZOLAM HCL 5 MG/5ML IJ SOLN
INTRAMUSCULAR | Status: DC | PRN
  Administered 2017-05-23: 2 mg via INTRAVENOUS

## 2017-05-23 MED ORDER — MIDAZOLAM HCL 2 MG/2ML IJ SOLN: 0.5000 mg | Freq: Once | INTRAMUSCULAR | Status: DC | PRN

## 2017-05-23 MED ORDER — PHENYLEPHRINE HCL 10 MG/ML IJ SOLN
INTRAMUSCULAR | Status: DC | PRN
  Administered 2017-05-23: 20 ug/min via INTRAVENOUS

## 2017-05-23 MED ORDER — LIDOCAINE HCL (PF) 1 % IJ SOLN
INTRAMUSCULAR | Status: AC
  Filled 2017-05-23: qty 30

## 2017-05-23 MED ORDER — BUPIVACAINE-EPINEPHRINE (PF) 0.5% -1:200000 IJ SOLN
INTRAMUSCULAR | Status: DC | PRN
  Administered 2017-05-23: 30 mL via PERINEURAL

## 2017-05-23 MED ORDER — FENTANYL CITRATE (PF) 250 MCG/5ML IJ SOLN
INTRAMUSCULAR | Status: DC | PRN
  Administered 2017-05-23: 50 ug via INTRAVENOUS

## 2017-05-23 MED ORDER — ONDANSETRON HCL 4 MG/2ML IJ SOLN
INTRAMUSCULAR | Status: AC
  Filled 2017-05-23: qty 2

## 2017-05-23 SURGICAL SUPPLY — 49 items
BANDAGE COBAN STERILE 2 (GAUZE/BANDAGES/DRESSINGS) ×3 IMPLANT
BIT DRILL SOLID 2.0X40MM (BIT) IMPLANT
BIT DRILL SOLID 2.5X40MM (BIT) IMPLANT
BLADE SURG 15 STRL LF DISP TIS (BLADE) ×1 IMPLANT
BLADE SURG 15 STRL SS (BLADE) ×2
BNDG COHESIVE 4X5 TAN STRL (GAUZE/BANDAGES/DRESSINGS) ×3 IMPLANT
BNDG ESMARK 4X9 LF (GAUZE/BANDAGES/DRESSINGS) ×3 IMPLANT
BNDG GAUZE ELAST 4 BULKY (GAUZE/BANDAGES/DRESSINGS) ×6 IMPLANT
BRUSH SCRUB EZ PLAIN DRY (MISCELLANEOUS) ×3 IMPLANT
CANISTER SUCT 3000ML PPV (MISCELLANEOUS) ×3 IMPLANT
CHLORAPREP W/TINT 26ML (MISCELLANEOUS) ×3 IMPLANT
CORDS BIPOLAR (ELECTRODE) ×3 IMPLANT
COVER BACK TABLE 60X90IN (DRAPES) ×3 IMPLANT
COVER SURGICAL LIGHT HANDLE (MISCELLANEOUS) ×3 IMPLANT
CUFF TOURNIQUET SINGLE 18IN (TOURNIQUET CUFF) ×3 IMPLANT
CUFF TOURNIQUET SINGLE 24IN (TOURNIQUET CUFF) IMPLANT
DRAPE C-ARM 42X72 X-RAY (DRAPES) ×3 IMPLANT
DRAPE SURG 17X23 STRL (DRAPES) ×3 IMPLANT
DRILL SOLID 2.0X40MM (BIT)
DRILL SOLID 2.5X40MM (BIT)
DRSG ADAPTIC 3X8 NADH LF (GAUZE/BANDAGES/DRESSINGS) ×3 IMPLANT
DRSG EMULSION OIL 3X3 NADH (GAUZE/BANDAGES/DRESSINGS) ×3 IMPLANT
GAUZE SPONGE 4X4 12PLY STRL (GAUZE/BANDAGES/DRESSINGS) ×3 IMPLANT
GLOVE BIO SURGEON STRL SZ7.5 (GLOVE) ×3 IMPLANT
GLOVE BIOGEL PI IND STRL 8 (GLOVE) ×1 IMPLANT
GLOVE BIOGEL PI INDICATOR 8 (GLOVE) ×2
GOWN STRL REUS W/ TWL XL LVL3 (GOWN DISPOSABLE) ×1 IMPLANT
GOWN STRL REUS W/TWL XL LVL3 (GOWN DISPOSABLE) ×2
KIT BASIN OR (CUSTOM PROCEDURE TRAY) ×3 IMPLANT
NEEDLE HYPO 22GX1.5 SAFETY (NEEDLE) ×3 IMPLANT
NEEDLE HYPO 25X1 1.5 SAFETY (NEEDLE) IMPLANT
NS IRRIG 1000ML POUR BTL (IV SOLUTION) ×3 IMPLANT
PACK ORTHO EXTREMITY (CUSTOM PROCEDURE TRAY) ×3 IMPLANT
PAD CAST 4YDX4 CTTN HI CHSV (CAST SUPPLIES) ×1 IMPLANT
PADDING CAST ABS 4INX4YD NS (CAST SUPPLIES) ×2
PADDING CAST ABS COTTON 4X4 ST (CAST SUPPLIES) ×1 IMPLANT
PADDING CAST COTTON 4X4 STRL (CAST SUPPLIES) ×2
PENCIL BUTTON HOLSTER BLD 10FT (ELECTRODE) ×3 IMPLANT
RUBBERBAND STERILE (MISCELLANEOUS) IMPLANT
SCREW GEMINUS PALS 2.5X18 (Screw) ×3 IMPLANT
SKELETAL DYNAMICS DVR SET (Set) ×3 IMPLANT
SUT VIC AB 2-0 CT3 27 (SUTURE) ×3 IMPLANT
SUT VICRYL 4-0 PS2 18IN ABS (SUTURE) IMPLANT
SUT VICRYL RAPIDE 4/0 PS 2 (SUTURE) ×3 IMPLANT
SYR 10ML LL (SYRINGE) ×3 IMPLANT
TOWEL OR 17X24 6PK STRL BLUE (TOWEL DISPOSABLE) ×3 IMPLANT
TUBE CONNECTING 12'X1/4 (SUCTIONS) ×1
TUBE CONNECTING 12X1/4 (SUCTIONS) ×2 IMPLANT
UNDERPAD 30X30 (UNDERPADS AND DIAPERS) ×3 IMPLANT

## 2017-05-23 NOTE — Discharge Instructions (Signed)
Discharge Instructions  You have a dressing with a plaster splint incorporated in it. Move your fingers as much as possible, making a full fist and fully opening the fist. Elevate your hand to reduce pain & swelling of the digits.  Ice over the operative site may be helpful to reduce pain & swelling.  DO NOT USE HEAT. Pain medicine has been prescribed for you and you have this Rx at home. Tylenol 650 mg every 6 hours. Additionally, you can take Oxycodone as a rescue medicine for severe pain. Leave the dressing in place until you return to our office.  You may shower, but keep the bandage clean & dry.  You may drive a car when you are off of prescription pain medications and can safely control your vehicle with both hands. Our office will call you to arrange follow-up   Please call [phone] during normal business hours or [phone] after hours for any problems. Including the following:  - excessive redness of the incisions - drainage for more than 4 days - fever of more than 101.5 F  *Please note that pain medications will not be refilled after hours or on weekends.  Post Anesthesia Home Care Instructions  Activity: Get plenty of rest for the remainder of the day. A responsible individual must stay with you for 24 hours following the procedure.  For the next 24 hours, DO NOT: -Drive a car -Paediatric nurse -Drink alcoholic beverages -Take any medication unless instructed by your physician -Make any legal decisions or sign important papers.  Meals: Start with liquid foods such as gelatin or soup. Progress to regular foods as tolerated. Avoid greasy, spicy, heavy foods. If nausea and/or vomiting occur, drink only clear liquids until the nausea and/or vomiting subsides. Call your physician if vomiting continues.  Special Instructions/Symptoms: Your throat may feel dry or sore from the anesthesia or the breathing tube placed in your throat during surgery. If this causes  discomfort, gargle with warm salt water. The discomfort should disappear within 24 hours.

## 2017-05-23 NOTE — Anesthesia Postprocedure Evaluation (Signed)
Anesthesia Post Note  Patient: [name]  Procedure(s) Performed: OPEN REDUCTION INTERNAL FIXATION (ORIF) DISTAL RADIAL FRACTURE (Right Wrist)     Patient location during evaluation: PACU Anesthesia Type: General and Regional Level of consciousness: awake and alert, oriented and patient cooperative Pain management: pain level controlled Vital Signs Assessment: post-procedure vital signs reviewed and stable Respiratory status: spontaneous breathing, nonlabored ventilation and respiratory function stable Cardiovascular status: blood pressure returned to baseline and stable Postop Assessment: no apparent nausea or vomiting Anesthetic complications: no    Last Vitals:  Vitals:   05/23/17 0930 05/23/17 0940  BP: (!) 99/46 (!) 93/58  Pulse: 86 84  Resp: 20 18  Temp:  36.5 C  SpO2: 97% 95%    Last Pain:  Vitals:   05/23/17 0940  TempSrc:   PainSc: 0-No pain                 Deatrice Spanbauer,E. Soul Hackman

## 2017-05-23 NOTE — Transfer of Care (Signed)
Immediate Anesthesia Transfer of Care Note  Patient: [name]  Procedure(s) Performed: OPEN REDUCTION INTERNAL FIXATION (ORIF) DISTAL RADIAL FRACTURE (Right Wrist)  Patient Location: PACU  Anesthesia Type:GA combined with regional for post-op pain  Level of Consciousness: drowsy and patient cooperative  Airway & Oxygen Therapy: Patient Spontanous Breathing and Patient connected to face mask oxygen  Post-op Assessment: Report given to RN and Post -op Vital signs reviewed and stable  Post vital signs: Reviewed and stable  Last Vitals:  Vitals Value Taken Time  BP 109/54 05/23/2017  8:54 AM  Temp    Pulse 73 05/23/2017  8:55 AM  Resp 17 05/23/2017  8:55 AM  SpO2 100 % 05/23/2017  8:55 AM  Vitals shown include unvalidated device data.  Last Pain:  Vitals:   05/23/17 0854  TempSrc:   PainSc: (P) 0-No pain      Patients Stated Pain Goal: 3 (16/10/96 0454)  Complications: No apparent anesthesia complications

## 2017-05-23 NOTE — Anesthesia Procedure Notes (Signed)
Procedure Name: LMA Insertion Date/Time: 05/23/2017 7:44 AM Performed by: Freddie Breech, CRNA Pre-anesthesia Checklist: Patient identified, Emergency Drugs available, Suction available and Patient being monitored Patient Re-evaluated:Patient Re-evaluated prior to induction Oxygen Delivery Method: Circle System Utilized Preoxygenation: Pre-oxygenation with 100% oxygen Induction Type: IV induction Ventilation: Mask ventilation without difficulty LMA: LMA inserted LMA Size: 4.0 Number of attempts: 1 Airway Equipment and Method: Bite block Placement Confirmation: positive ETCO2 Tube secured with: Tape Dental Injury: Teeth and Oropharynx as per pre-operative assessment

## 2017-05-23 NOTE — Anesthesia Procedure Notes (Addendum)
Anesthesia Regional Block: Supraclavicular block   Pre-Anesthetic Checklist: ,, timeout performed, Correct Patient, Correct Site, Correct Laterality, Correct Procedure, Correct Position, site marked, Risks and benefits discussed,  Surgical consent,  Pre-op evaluation,  At surgeon's request and post-op pain management  Laterality: Right and Upper  Prep: chloraprep       Needles:  Injection technique: Single-shot  Needle Type: Echogenic Stimulator Needle     Needle Length: 9cm  Needle Gauge: 21     Additional Needles:   Procedures:, nerve stimulator,,, ultrasound used (permanent image in chart),,,,   Nerve Stimulator or Paresthesia:  Response: forearm, 0.4 mA, 0.1 ms,   Additional Responses:   Narrative:  Start time: 05/23/2017 7:14 AM End time: 05/23/2017 7:22 AM Injection made incrementally with aspirations every 5 mL.  Performed by: Personally  Anesthesiologist: Annye Asa, MD  Additional Notes: Pt identified in Holding room.  Monitors applied. Working IV access confirmed. Sterile prep, drape R neck.  #21ga ECHOgenic PNS to forearm twitch at 0.6mA threshold, with US guidance.  30cc 0.5% Bupivacaine with 1:200k epi injected incrementally after negative test dose.  Patient asymptomatic, VSS, no heme aspirated, tolerated well.  Jenita Seashore, MD

## 2017-05-23 NOTE — Interval H&P Note (Signed)
History and Physical Interval Note:  05/23/2017 7:37 AM  [name]  has presented today for surgery, with the diagnosis of RIGHT DISTAL RADIUS FRACTURE  The various methods of treatment have been discussed with the patient and family. After consideration of risks, benefits and other options for treatment, the patient has consented to  Procedure(s): OPEN REDUCTION INTERNAL FIXATION (ORIF) DISTAL RADIAL FRACTURE (Right) as a surgical intervention .  The patient's history has been reviewed, patient examined, no change in status, stable for surgery.  I have reviewed the patient's chart and labs.  Questions were answered to the patient's satisfaction.     [name]

## 2017-05-23 NOTE — Op Note (Signed)
05/23/2017  7:38 AM  PATIENT:  [name]  62 y.o. female  PRE-OPERATIVE DIAGNOSIS:  Displaced comminuted right  intra-articular DRFx (3 frag)  POST-OPERATIVE DIAGNOSIS:  Same  PROCEDURE:  ORIF of displaced comminuted right  intra-articular DRFx (3 frag)  SURGEON: Rayvon Char. Grandville Silos, MD  PHYSICIAN ASSISTANT: Morley Kos, OPA-C  ANESTHESIA:  regional and general  SPECIMENS:  None  DRAINS: None  EBL:  less than 50 mL  PREOPERATIVE INDICATIONS:  [name] is a  62 y.o. female with a displaced comminuted R DRFx  The risks benefits and alternatives were discussed with the patient preoperatively including but not limited to the risks of infection, bleeding, nerve injury, cardiopulmonary complications, the need for revision surgery, among others, and the patient verbalized understanding and consented to proceed.  OPERATIVE IMPLANTS: [employer] Geminus narrow 4-hole plate/screw/pegs  OPERATIVE PROCEDURE: After receiving prophylactic antibiotics and a regional block, the patient was escorted to the operative theatre and placed in a supine position. General anesthesia was administered.  A surgical "time-out" was performed during which the planned procedure, proposed operative site, and the correct patient identity were compared to the operative consent and agreement confirmed by the circulating nurse according to current facility policy. Following application of a tourniquet to the operative extremity, the exposed skin was pre-scrubbed with Hibiclens scrub brush and then was prepped with Chloraprep and draped in the usual sterile fashion. The limb was exsanguinated with an Esmarch bandage and the tourniquet inflated to approximately 160mmHg higher than systolic BP.   A sinusoidal-shaped incision was marked and made over the FCR axis and the distal forearm. The skin was incised sharply with scalpel, subcutaneous tissues with blunt and spreading dissection. The FCR axis  was exploited deeply. The pronator quadratus was reflected in an L-shaped ulnarly and the brachioradialis was split in a Z-plasty fashion for later reapproximation. The fracture was inspected and provisionally reduced.  This was confirmed fluoroscopically. The appropriately sized plate was selected and found to fit well. It was placed in its provisional alignment of the radius and this was confirmed fluoroscopically.  It was secured to the radius with a screw through the slotted hole.  Additional adjustments were made as necessary, and the distal holes were all drilled and filled.  Peg/screw length distally was selected on the shorter side of measurements to minimize the risk for dorsal cortical penetration. The remainder of the proximal holes were drilled and filled.   Final images were obtained and the DRUJ was examined for stability. It was found to be sufficiently stable. The wound was then copiously irrigated and the brachioradialis repaired with 2-0 Vicryl Rapide suture followed by repair of the pronator quadratus with the same suture type. Tourniquet was released and additional hemostasis obtained and the skin was closed with 2-0 Vicryl deep dermal buried sutures followed by running 4-0 Vicryl Rapide horizontal mattress suture in the skin. A bulky dressing with a volar plaster component was applied and the patient was taken to the recovery room in stable condition.  DISPOSITION: The patient will be discharged home today with typical post-op instructions, returning in 10-15 days for reevaluation with new x-rays of the affected wrist out of the splint to include an inclined lateral and then transition to therapy to have a custom splint constructed and begin rehabilitation.

## 2017-05-27 ENCOUNTER — Encounter (HOSPITAL_COMMUNITY): Payer: Self-pay | Admitting: Orthopedic Surgery

## 2017-07-02 ENCOUNTER — Other Ambulatory Visit: Payer: Self-pay | Admitting: Cardiology

## 2017-07-02 NOTE — Telephone Encounter (Signed)
Pt's pharmacy is requesting a refill on Atorvastatin 40 mg tablet. Would Dr. Johnsie Cancel like to refill this medication? Please advise

## 2017-07-09 ENCOUNTER — Encounter: Payer: Self-pay | Admitting: Gastroenterology

## 2017-08-14 ENCOUNTER — Other Ambulatory Visit: Payer: Self-pay | Admitting: Family Medicine

## 2017-08-14 DIAGNOSIS — Z1231 Encounter for screening mammogram for malignant neoplasm of breast: Secondary | ICD-10-CM

## 2017-08-18 ENCOUNTER — Other Ambulatory Visit: Payer: Self-pay | Admitting: Cardiovascular Disease

## 2017-09-09 ENCOUNTER — Ambulatory Visit
Admission: RE | Admit: 2017-09-09 | Discharge: 2017-09-09 | Disposition: A | Discharge status: Home / Self Care | Payer: 59 | Source: Ambulatory Visit | Attending: Family Medicine | Admitting: Family Medicine

## 2017-09-09 DIAGNOSIS — Z1231 Encounter for screening mammogram for malignant neoplasm of breast: Secondary | ICD-10-CM
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
## 2017-09-11 ENCOUNTER — Encounter: Payer: Self-pay | Admitting: Cardiovascular Disease

## 2017-09-11 ENCOUNTER — Ambulatory Visit (INDEPENDENT_AMBULATORY_CARE_PROVIDER_SITE_OTHER): Payer: 59 | Admitting: Cardiovascular Disease

## 2017-09-11 VITALS — BP 128/68 | HR 50 | Ht 63.0 in | Wt 237.2 lb

## 2017-09-11 DIAGNOSIS — I493 Ventricular premature depolarization: Secondary | ICD-10-CM | POA: Diagnosis not present

## 2017-09-11 DIAGNOSIS — R9439 Abnormal result of other cardiovascular function study: Secondary | ICD-10-CM

## 2017-09-11 DIAGNOSIS — E785 Hyperlipidemia, unspecified: Secondary | ICD-10-CM | POA: Diagnosis not present

## 2017-09-11 NOTE — Patient Instructions (Addendum)

## 2017-11-17 ENCOUNTER — Other Ambulatory Visit: Payer: Self-pay | Admitting: Cardiovascular Disease

## 2017-11-27 ENCOUNTER — Other Ambulatory Visit: Payer: Self-pay | Admitting: Cardiovascular Disease

## 2017-12-05 ENCOUNTER — Emergency Department (HOSPITAL_COMMUNITY): Payer: 59

## 2017-12-05 ENCOUNTER — Other Ambulatory Visit: Payer: Self-pay

## 2017-12-05 ENCOUNTER — Emergency Department (HOSPITAL_COMMUNITY)
Admission: EM | Admit: 2017-12-05 | Discharge: 2017-12-05 | Disposition: A | Discharge status: Home / Self Care | Payer: 59 | Attending: Emergency Medicine | Admitting: Emergency Medicine

## 2017-12-05 ENCOUNTER — Encounter (HOSPITAL_COMMUNITY): Payer: Self-pay | Admitting: Emergency Medicine

## 2017-12-05 DIAGNOSIS — Z79899 Other long term (current) drug therapy: Secondary | ICD-10-CM | POA: Diagnosis not present

## 2017-12-05 DIAGNOSIS — M25512 Pain in left shoulder: Secondary | ICD-10-CM | POA: Insufficient documentation

## 2017-12-05 DIAGNOSIS — Z7982 Long term (current) use of aspirin: Secondary | ICD-10-CM | POA: Diagnosis not present

## 2017-12-05 NOTE — ED Notes (Signed)
Patient verbalizes understanding of discharge instructions. Opportunity for questioning and answers were provided. Armband removed by staff, pt discharged from ED.  
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
## 2017-12-05 NOTE — Discharge Instructions (Addendum)
Use ice and Tylenol as needed for pain.  Return for new or worsening symptoms.
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
## 2018-02-26 ENCOUNTER — Other Ambulatory Visit: Payer: Self-pay | Admitting: Cardiovascular Disease

## 2018-09-11 ENCOUNTER — Other Ambulatory Visit: Payer: Self-pay | Admitting: Cardiovascular Disease

## 2018-10-13 ENCOUNTER — Other Ambulatory Visit: Payer: Self-pay | Admitting: Cardiovascular Disease

## 2018-10-22 ENCOUNTER — Other Ambulatory Visit: Payer: Self-pay | Admitting: Cardiovascular Disease

## 2018-10-27 ENCOUNTER — Other Ambulatory Visit: Payer: Self-pay | Admitting: Family Medicine

## 2018-10-27 DIAGNOSIS — Z1231 Encounter for screening mammogram for malignant neoplasm of breast: Secondary | ICD-10-CM

## 2018-11-11 ENCOUNTER — Other Ambulatory Visit: Payer: Self-pay | Admitting: Cardiovascular Disease
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
## 2018-11-27 ENCOUNTER — Other Ambulatory Visit: Payer: Self-pay

## 2018-11-27 ENCOUNTER — Ambulatory Visit (INDEPENDENT_AMBULATORY_CARE_PROVIDER_SITE_OTHER): Payer: 59 | Admitting: Cardiovascular Disease

## 2018-11-27 ENCOUNTER — Encounter: Payer: Self-pay | Admitting: Cardiovascular Disease

## 2018-11-27 VITALS — BP 128/70 | HR 98 | Ht 63.0 in | Wt 236.0 lb

## 2018-11-27 DIAGNOSIS — R002 Palpitations: Secondary | ICD-10-CM | POA: Diagnosis not present

## 2018-11-27 NOTE — Patient Instructions (Signed)

## 2018-12-10 ENCOUNTER — Other Ambulatory Visit: Payer: Self-pay

## 2018-12-10 ENCOUNTER — Ambulatory Visit
Admission: RE | Admit: 2018-12-10 | Discharge: 2018-12-10 | Disposition: A | Discharge status: Home / Self Care | Payer: 59 | Source: Ambulatory Visit | Attending: Family Medicine | Admitting: Family Medicine

## 2018-12-10 DIAGNOSIS — Z1231 Encounter for screening mammogram for malignant neoplasm of breast: Secondary | ICD-10-CM

## 2018-12-21 ENCOUNTER — Other Ambulatory Visit: Payer: Self-pay | Admitting: Cardiovascular Disease

## 2019-01-08 ENCOUNTER — Other Ambulatory Visit: Payer: Self-pay | Admitting: Cardiovascular Disease

## 2019-03-11 ENCOUNTER — Encounter: Payer: Self-pay | Admitting: Family Medicine

## 2019-03-17 ENCOUNTER — Other Ambulatory Visit: Payer: Self-pay | Admitting: Cardiovascular Disease

## 2019-03-17 MED ORDER — ATORVASTATIN CALCIUM 40 MG PO TABS: 40.0000 mg | ORAL_TABLET | Freq: Every day | ORAL | 2 refills | Status: DC

## 2019-03-17 MED ORDER — METOPROLOL TARTRATE 25 MG PO TABS: ORAL_TABLET | ORAL | 2 refills | Status: DC

## 2019-03-17 NOTE — Telephone Encounter (Signed)
Pt's medications were sent to her pharmacy as requested. Confirmation received.  

## 2019-07-23 ENCOUNTER — Encounter: Payer: Self-pay | Admitting: *Deleted

## 2019-07-23 NOTE — Progress Notes (Signed)
This encounter was created in error - please disregard.

## 2019-12-02 ENCOUNTER — Ambulatory Visit: Payer: TRICARE For Life (TFL) | Attending: Internal Medicine

## 2019-12-02 DIAGNOSIS — Z23 Encounter for immunization: Secondary | ICD-10-CM

## 2019-12-02 NOTE — Progress Notes (Signed)
   Covid-19 Vaccination Clinic  Name:  [name]    MRN: [id number] DOB: [birth year]  12/02/2019  [name] was observed post Covid-19 immunization for 15 minutes without incident. She was provided with Vaccine Information Sheet and instruction to access the V-Safe system.   [name] was instructed to call 911 with any severe reactions post vaccine: Marland Kitchen Difficulty breathing  . Swelling of face and throat  . A fast heartbeat  . A bad rash all over body  . Dizziness and weakness
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
## 2019-12-09 ENCOUNTER — Other Ambulatory Visit: Payer: Self-pay | Admitting: Cardiovascular Disease

## 2019-12-13 ENCOUNTER — Ambulatory Visit (INDEPENDENT_AMBULATORY_CARE_PROVIDER_SITE_OTHER): Payer: Medicare Other | Admitting: Cardiovascular Disease

## 2019-12-13 ENCOUNTER — Encounter: Payer: Self-pay | Admitting: Cardiovascular Disease

## 2019-12-13 ENCOUNTER — Other Ambulatory Visit: Payer: Self-pay

## 2019-12-13 VITALS — BP 158/82 | HR 59 | Ht 63.0 in | Wt 246.8 lb

## 2019-12-13 DIAGNOSIS — R002 Palpitations: Secondary | ICD-10-CM | POA: Diagnosis not present

## 2019-12-13 NOTE — Patient Instructions (Signed)

## 2020-01-13 MED ORDER — ATORVASTATIN CALCIUM 40 MG PO TABS: 40.0000 mg | ORAL_TABLET | Freq: Every day | ORAL | 3 refills | Status: DC

## 2020-01-13 MED ORDER — METOPROLOL TARTRATE 25 MG PO TABS: 12.5000 mg | ORAL_TABLET | Freq: Two times a day (BID) | ORAL | 3 refills | Status: DC

## 2020-02-02 ENCOUNTER — Telehealth: Payer: Self-pay

## 2020-02-02 DIAGNOSIS — Z1329 Encounter for screening for other suspected endocrine disorder: Secondary | ICD-10-CM

## 2020-02-02 DIAGNOSIS — Z79899 Other long term (current) drug therapy: Secondary | ICD-10-CM

## 2020-02-02 DIAGNOSIS — Z0000 Encounter for general adult medical examination without abnormal findings: Secondary | ICD-10-CM

## 2020-02-02 DIAGNOSIS — Z13220 Encounter for screening for lipoid disorders: Secondary | ICD-10-CM

## 2020-02-02 NOTE — Telephone Encounter (Signed)
Last labs completed 11/25/13 VIT D, TSH, BMET, HEPATIC, LIPID. Please advise. Thank you

## 2020-02-02 NOTE — Telephone Encounter (Signed)
Patient is coming in for her physical in January and would like to have lab work done in advance at [facility].

## 2020-02-03 NOTE — Telephone Encounter (Signed)
Lab orders placed and pt is aware 

## 2020-02-03 NOTE — Telephone Encounter (Signed)
Pls order cbc, cmp, tsh, lipids. Thx. Dr. Karie Schwalbe

## 2020-02-16 LAB — CBC WITH DIFFERENTIAL/PLATELET
Basophils Absolute: 0.1 10*3/uL (ref 0.0–0.2)
Basos: 1 %
EOS (ABSOLUTE): 0.2 10*3/uL (ref 0.0–0.4)
Eos: 2 %
Hematocrit: 43.4 % (ref 34.0–46.6)
Hemoglobin: 14.4 g/dL (ref 11.1–15.9)
Immature Grans (Abs): 0 10*3/uL (ref 0.0–0.1)
Immature Granulocytes: 0 %
Lymphocytes Absolute: 2.5 10*3/uL (ref 0.7–3.1)
Lymphs: 30 %
MCH: 30.3 pg (ref 26.6–33.0)
MCHC: 33.2 g/dL (ref 31.5–35.7)
MCV: 91 fL (ref 79–97)
Monocytes Absolute: 0.5 10*3/uL (ref 0.1–0.9)
Monocytes: 6 %
Neutrophils Absolute: 5.3 10*3/uL (ref 1.4–7.0)
Neutrophils: 61 %
Platelets: 224 10*3/uL (ref 150–450)
RBC: 4.76 x10E6/uL (ref 3.77–5.28)
RDW: 13.2 % (ref 11.7–15.4)
WBC: 8.5 10*3/uL (ref 3.4–10.8)

## 2020-02-16 LAB — LIPID PANEL
Chol/HDL Ratio: 3.3 ratio (ref 0.0–4.4)
Cholesterol, Total: 139 mg/dL (ref 100–199)
HDL: 42 mg/dL (ref >39)
LDL Chol Calc (NIH): 58 mg/dL (ref 0–99)
Triglycerides: 248 mg/dL — ABNORMAL HIGH (ref 0–149)
VLDL Cholesterol Cal: 39 mg/dL (ref 5–40)

## 2020-02-16 LAB — COMPREHENSIVE METABOLIC PANEL
ALT: 16 IU/L (ref 0–32)
AST: 15 IU/L (ref 0–40)
Albumin/Globulin Ratio: 1.9 (ref 1.2–2.2)
Albumin: 4.7 g/dL (ref 3.8–4.8)
Alkaline Phosphatase: 90 IU/L (ref 44–121)
BUN/Creatinine Ratio: 13 (ref 12–28)
BUN: 16 mg/dL (ref 8–27)
Bilirubin Total: 0.5 mg/dL (ref 0.0–1.2)
CO2: 26 mmol/L (ref 20–29)
Calcium: 9.8 mg/dL (ref 8.7–10.3)
Chloride: 100 mmol/L (ref 96–106)
Creatinine, Ser: 1.2 mg/dL — ABNORMAL HIGH (ref 0.57–1.00)
GFR calc Af Amer: 54 mL/min/{1.73_m2} — ABNORMAL LOW (ref >59)
GFR calc non Af Amer: 47 mL/min/{1.73_m2} — ABNORMAL LOW (ref >59)
Globulin, Total: 2.5 g/dL (ref 1.5–4.5)
Glucose: 118 mg/dL — ABNORMAL HIGH (ref 65–99)
Potassium: 4.6 mmol/L (ref 3.5–5.2)
Sodium: 140 mmol/L (ref 134–144)
Total Protein: 7.2 g/dL (ref 6.0–8.5)

## 2020-02-16 LAB — TSH: TSH: 4.76 u[IU]/mL — ABNORMAL HIGH (ref 0.450–4.500)

## 2020-02-29 ENCOUNTER — Encounter (INDEPENDENT_AMBULATORY_CARE_PROVIDER_SITE_OTHER): Payer: Self-pay | Admitting: *Deleted

## 2020-02-29 ENCOUNTER — Encounter: Payer: Self-pay | Admitting: Family Medicine

## 2020-02-29 ENCOUNTER — Ambulatory Visit (INDEPENDENT_AMBULATORY_CARE_PROVIDER_SITE_OTHER): Payer: Medicare Other | Admitting: Family Medicine

## 2020-02-29 ENCOUNTER — Other Ambulatory Visit: Payer: Self-pay

## 2020-02-29 VITALS — BP 143/88 | HR 97 | Temp 97.5°F | Wt 245.6 lb

## 2020-02-29 DIAGNOSIS — Z1211 Encounter for screening for malignant neoplasm of colon: Secondary | ICD-10-CM | POA: Diagnosis not present

## 2020-02-29 DIAGNOSIS — R7989 Other specified abnormal findings of blood chemistry: Secondary | ICD-10-CM | POA: Diagnosis not present

## 2020-02-29 DIAGNOSIS — Z0000 Encounter for general adult medical examination without abnormal findings: Secondary | ICD-10-CM | POA: Diagnosis not present

## 2020-02-29 DIAGNOSIS — R946 Abnormal results of thyroid function studies: Secondary | ICD-10-CM | POA: Diagnosis not present

## 2020-02-29 DIAGNOSIS — I493 Ventricular premature depolarization: Secondary | ICD-10-CM

## 2020-02-29 NOTE — Progress Notes (Signed)
Patient ID: TANICA GAIGE, female    DOB: 1956/01/15, 65 y.o.   MRN: [id number]   Chief Complaint  Patient presents with  . Annual Exam   Subjective:    HPI  AWV- Annual Wellness Visit  The patient was seen for their annual wellness visit. The patient's past medical history, surgical history, and family history were reviewed. Pertinent vaccines were reviewed ( tetanus, pneumonia, shingles, flu) The patient's medication list was reviewed and updated.  The height and weight were entered.  BMI recorded in electronic record elsewhere  Cognitive screening was completed. Outcome of Mini - Cog: PASS   Falls /depression screening electronically recorded within record elsewhere  Recent listing of emergency department/hospitalizations over the past year were reviewed.  current specialist the patient sees on a regular basis: Cardiology    Medicare annual wellness visit patient questionnaire was reviewed.  A written screening schedule for the patient for the next 5-10 years was given. Appropriate discussion of followup regarding next visit was discussed.  Hypothyroid- occ noticing dry skin, not excessive. Since retiring anxiety has improved.  No constipation or hair loss.  At one time was on synthroid before she retired. Was told "underactive thyroid." Has eye doctor 1x per yr. Has glaucoma and taking drops daily.  HM- needing colonoscopy.  Has some back issues but not ready for having any procedures or wanting to see specialist yet.  Medical History Takyia has a past medical history of Allergic rhinitis, Arthritis, GERD (gastroesophageal reflux disease), Glaucoma, Skipped heart beats, and Venous stasis.   Outpatient Encounter Medications as of 02/29/2020  Medication Sig  . aspirin EC 81 MG tablet Take 1 tablet (81 mg total) by mouth daily.  Marland Kitchen atorvastatin (LIPITOR) 40 MG tablet Take 1 tablet (40 mg total) by mouth daily.  . metoprolol tartrate (LOPRESSOR) 25 MG tablet  Take 0.5 tablets (12.5 mg total) by mouth 2 (two) times daily.  . naproxen sodium (ALEVE) 220 MG tablet Take 440 mg by mouth daily as needed (for pain).  . TRAVATAN Z 0.004 % SOLN ophthalmic solution Place 1 drop into both eyes at bedtime.    No facility-administered encounter medications on file as of 02/29/2020.     Review of Systems  Constitutional: Negative for chills and fever.  HENT: Negative for congestion, rhinorrhea and sore throat.   Respiratory: Negative for cough, shortness of breath and wheezing.   Cardiovascular: Negative for chest pain and leg swelling.  Gastrointestinal: Negative for abdominal pain, diarrhea, nausea and vomiting.  Genitourinary: Negative for dysuria and frequency.  Musculoskeletal: Negative for arthralgias and back pain.  Skin: Negative for rash.  Neurological: Negative for dizziness, weakness and headaches.     Vitals BP (!) 143/88   Pulse 97   Temp (!) 97.5 F (36.4 C)   Wt 245 lb 9.6 oz (111.4 kg)   LMP  (LMP Unknown) Comment: tubal ligation  SpO2 98%   BMI 43.51 kg/m   Objective:   Physical Exam Vitals and nursing note reviewed.  Constitutional:      General: She is not in acute distress.    Appearance: Normal appearance. She is not ill-appearing.  HENT:     Head: Normocephalic and atraumatic.     Right Ear: Tympanic membrane normal.     Left Ear: Tympanic membrane normal.     Nose: Nose normal. No congestion or rhinorrhea.     Mouth/Throat:     Mouth: Mucous membranes are moist.     Pharynx: Oropharynx is  clear.  Eyes:     Extraocular Movements: Extraocular movements intact.     Conjunctiva/sclera: Conjunctivae normal.     Pupils: Pupils are equal, round, and reactive to light.  Cardiovascular:     Rate and Rhythm: Normal rate and regular rhythm.     Pulses: Normal pulses.     Heart sounds: Normal heart sounds.  Pulmonary:     Effort: Pulmonary effort is normal.     Breath sounds: Normal breath sounds. No wheezing, rhonchi  or rales.  Abdominal:     General: Abdomen is flat. Bowel sounds are normal. There is no distension.     Palpations: Abdomen is soft. There is no mass.     Tenderness: There is no abdominal tenderness. There is no guarding or rebound.     Hernia: No hernia is present.  Musculoskeletal:        General: Normal range of motion.     Right lower leg: No edema.     Left lower leg: No edema.  Skin:    General: Skin is warm and dry.     Findings: No lesion or rash.  Neurological:     General: No focal deficit present.     Mental Status: She is alert and oriented to person, place, and time.  Psychiatric:        Mood and Affect: Mood normal.        Behavior: Behavior normal.      Assessment and Plan   1. Medicare annual wellness visit, subsequent  2. TSH elevation - T4 - TSH  3. Colon cancer screening  4. Encounter for screening colonoscopy - Ambulatory referral to Gastroenterology  5. Thyroid function test abnormal - T4 - TSH  6. PVC (premature ventricular contraction)    H/o hypothyroid in the past.  Will recheck tsh and t4.  Last tsh was elevated.  Hm- ordered GI consult for colonoscopy.  HTN- suboptimal.  Pt seeing cardiologist.  Cont to monitor. Cont meds. H/o PVCs- taking metoprolol.  HLD- stable.  Seeing cards.  Cont lipitor.  Has h/o elevated TGs, if continuing then may need tricor, per cards.  F/u 1 yr or prn.

## 2020-03-01 LAB — TSH: TSH: 2.93 u[IU]/mL (ref 0.450–4.500)

## 2020-03-01 LAB — T4: T4, Total: 8.1 ug/dL (ref 4.5–12.0)

## 2020-05-05 ENCOUNTER — Telehealth (INDEPENDENT_AMBULATORY_CARE_PROVIDER_SITE_OTHER): Payer: Self-pay

## 2020-05-05 ENCOUNTER — Other Ambulatory Visit (INDEPENDENT_AMBULATORY_CARE_PROVIDER_SITE_OTHER): Payer: Self-pay

## 2020-05-05 DIAGNOSIS — Z1211 Encounter for screening for malignant neoplasm of colon: Secondary | ICD-10-CM

## 2020-05-05 MED ORDER — PEG 3350-KCL-NA BICARB-NACL 420 G PO SOLR: 4000.0000 mL | ORAL | 0 refills | Status: DC

## 2020-05-05 NOTE — Telephone Encounter (Signed)
[name], CMA  

## 2020-05-05 NOTE — Telephone Encounter (Signed)
Referring MD/PCP: Elvia Collum   Procedure: Tcs  Reason/Indication:  Screening  Has patient had this procedure before?  Yes   If so, when, by whom and where?  Over 11 yrs ago  Is there a family history of colon cancer?  no  Who?  What age when diagnosed?    Is patient diabetic?   no      Does patient have prosthetic heart valve or mechanical valve?  no  Do you have a pacemaker/defibrillator?  no  Has patient ever had endocarditis/atrial fibrillation? no  Have you had a stroke/heart attack last 6 mths? no  Does patient use oxygen? no  Has patient had joint replacement within last 12 months?  no  Is patient constipated or do they take laxatives? no  Does patient have a history of alcohol/drug use?  no  Is patient on blood thinner such as Coumadin, Plavix and/or Aspirin? no  Do you take medicine for weight loss. no  Medications: Atorvastatin 40mg  daily, Meoprolol 25 mg 1/2 tab bid, Rravatan 0.004 ophthalmic solution  Allergies: Amoxicillin, Prednisone  Procedure date & time: 05/26/20/ AM

## 2020-05-05 NOTE — Telephone Encounter (Signed)
Ok to schedule.  Thanks,  Acie Custis Castaneda Mayorga, MD Gastroenterology and Hepatology [facility]  

## 2020-05-09 ENCOUNTER — Telehealth (INDEPENDENT_AMBULATORY_CARE_PROVIDER_SITE_OTHER): Payer: Self-pay

## 2020-05-09 MED ORDER — PEG 3350-KCL-NA BICARB-NACL 420 G PO SOLR: 4000.0000 mL | ORAL | 0 refills | Status: DC

## 2020-05-09 NOTE — Telephone Encounter (Signed)
[name], CMA  

## 2020-05-10 ENCOUNTER — Encounter (INDEPENDENT_AMBULATORY_CARE_PROVIDER_SITE_OTHER): Payer: Self-pay

## 2020-05-18 NOTE — Patient Instructions (Signed)
[name]  05/18/2020     @PREFPERIOPPHARMACY @   Your procedure is scheduled on  05/26/2020   Report to [facility] at  1235  P.M.   Call this number if you have problems the morning of surgery:  [phone]   Remember:  Follow the diet and prep instructions given to you by the office.                   Take these medicines the morning of surgery with A SIP OF WATER  Pepcid, metoprolol.     Please brush your teeth.   Do not wear jewelry, make-up or nail polish.  Do not wear lotions, powders, or perfumes, or deodorant.  Do not shave 48 hours prior to surgery.  Men may shave face and neck.  Do not bring valuables to the hospital.  [facility] is not responsible for any belongings or valuables.  Contacts, dentures or bridgework may not be worn into surgery.  Leave your suitcase in the car.  After surgery it may be brought to your room.  For patients admitted to the hospital, discharge time will be determined by your treatment team.  Patients discharged the day of surgery will not be allowed to drive home and must have someone with them for 24 hours.   Special instructions:  DO NOT smoke tobacco or vape for 24 hours before your procedure.  Please read over the following fact sheets that you were given. Anesthesia Post-op Instructions and Care and Recovery After Surgery       Colonoscopy, Adult, Care After This sheet gives you information about how to care for yourself after your procedure. Your health care provider may also give you more specific instructions. If you have problems or questions, contact your health care provider. What can I expect after the procedure? After the procedure, it is common to have:  A small amount of blood in your stool for 24 hours after the procedure.  Some gas.  Mild cramping or bloating of your abdomen. Follow these instructions at home: Eating and drinking  Drink enough fluid to keep your urine pale  yellow.  Follow instructions from your health care provider about eating or drinking restrictions.  Resume your normal diet as instructed by your health care provider. Avoid heavy or fried foods that are hard to digest.   Activity  Rest as told by your health care provider.  Avoid sitting for a long time without moving. Get up to take short walks every 1-2 hours. This is important to improve blood flow and breathing. Ask for help if you feel weak or unsteady.  Return to your normal activities as told by your health care provider. Ask your health care provider what activities are safe for you. Managing cramping and bloating  Try walking around when you have cramps or feel bloated.  Apply heat to your abdomen as told by your health care provider. Use the heat source that your health care provider recommends, such as a moist heat pack or a heating pad. ? Place a towel between your skin and the heat source. ? Leave the heat on for 20-30 minutes. ? Remove the heat if your skin turns bright red. This is especially important if you are unable to feel pain, heat, or cold. You may have a greater risk of getting burned.   General instructions  If you were given a sedative during the procedure, it can affect you  for several hours. Do not drive or operate machinery until your health care provider says that it is safe.  For the first 24 hours after the procedure: ? Do not sign important documents. ? Do not drink alcohol. ? Do your regular daily activities at a slower pace than normal. ? Eat soft foods that are easy to digest.  Take over-the-counter and prescription medicines only as told by your health care provider.  Keep all follow-up visits as told by your health care provider. This is important. Contact a health care provider if:  You have blood in your stool 2-3 days after the procedure. Get help right away if you have:  More than a small spotting of blood in your stool.  Large blood  clots in your stool.  Swelling of your abdomen.  Nausea or vomiting.  A fever.  Increasing pain in your abdomen that is not relieved with medicine. Summary  After the procedure, it is common to have a small amount of blood in your stool. You may also have mild cramping and bloating of your abdomen.  If you were given a sedative during the procedure, it can affect you for several hours. Do not drive or operate machinery until your health care provider says that it is safe.  Get help right away if you have a lot of blood in your stool, nausea or vomiting, a fever, or increased pain in your abdomen. This information is not intended to replace advice given to you by your health care provider. Make sure you discuss any questions you have with your health care provider. Document Revised: 01/15/2019 Document Reviewed: 08/17/2018 Elsevier Patient Education  2021 [employer] After This sheet gives you information about how to care for yourself after your procedure. Your health care provider may also give you more specific instructions. If you have problems or questions, contact your health care provider. What can I expect after the procedure? After the procedure, it is common to have:  Tiredness.  Forgetfulness about what happened after the procedure.  Impaired judgment for important decisions.  Nausea or vomiting.  Some difficulty with balance. Follow these instructions at home: For the time period you were told by your health care provider:  Rest as needed.  Do not participate in activities where you could fall or become injured.  Do not drive or use machinery.  Do not drink alcohol.  Do not take sleeping pills or medicines that cause drowsiness.  Do not make important decisions or sign legal documents.  Do not take care of children on your own.      Eating and drinking  Follow the diet that is recommended by your health care  provider.  Drink enough fluid to keep your urine pale yellow.  If you vomit: ? Drink water, juice, or soup when you can drink without vomiting. ? Make sure you have little or no nausea before eating solid foods. General instructions  Have a responsible adult stay with you for the time you are told. It is important to have someone help care for you until you are awake and alert.  Take over-the-counter and prescription medicines only as told by your health care provider.  If you have sleep apnea, surgery and certain medicines can increase your risk for breathing problems. Follow instructions from your health care provider about wearing your sleep device: ? Anytime you are sleeping, including during daytime naps. ? While taking prescription pain medicines, sleeping medicines, or medicines that  make you drowsy.  Avoid smoking.  Keep all follow-up visits as told by your health care provider. This is important. Contact a health care provider if:  You keep feeling nauseous or you keep vomiting.  You feel light-headed.  You are still sleepy or having trouble with balance after 24 hours.  You develop a rash.  You have a fever.  You have redness or swelling around the IV site. Get help right away if:  You have trouble breathing.  You have new-onset confusion at home. Summary  For several hours after your procedure, you may feel tired. You may also be forgetful and have poor judgment.  Have a responsible adult stay with you for the time you are told. It is important to have someone help care for you until you are awake and alert.  Rest as told. Do not drive or operate machinery. Do not drink alcohol or take sleeping pills.  Get help right away if you have trouble breathing, or if you suddenly become confused. This information is not intended to replace advice given to you by your health care provider. Make sure you discuss any questions you have with your health care  provider. Document Revised: 10/07/2019 Document Reviewed: 12/24/2018 Elsevier Patient Education  2021 [employer].

## 2020-05-24 ENCOUNTER — Encounter (HOSPITAL_COMMUNITY): Payer: Self-pay

## 2020-05-24 ENCOUNTER — Encounter (HOSPITAL_COMMUNITY)
Admission: RE | Admit: 2020-05-24 | Discharge: 2020-05-24 | Disposition: A | Discharge status: Home / Self Care | Payer: Medicare Other | Source: Ambulatory Visit | Attending: Gastroenterology | Admitting: Gastroenterology

## 2020-05-24 ENCOUNTER — Other Ambulatory Visit (HOSPITAL_COMMUNITY)
Admission: RE | Admit: 2020-05-24 | Discharge: 2020-05-24 | Disposition: A | Discharge status: Home / Self Care | Payer: Medicare Other | Source: Ambulatory Visit | Attending: Gastroenterology | Admitting: Gastroenterology

## 2020-05-24 ENCOUNTER — Other Ambulatory Visit: Payer: Self-pay

## 2020-05-24 DIAGNOSIS — Z01812 Encounter for preprocedural laboratory examination: Secondary | ICD-10-CM | POA: Insufficient documentation

## 2020-05-24 DIAGNOSIS — Z20822 Contact with and (suspected) exposure to covid-19: Secondary | ICD-10-CM | POA: Diagnosis not present

## 2020-05-24 DIAGNOSIS — Z1211 Encounter for screening for malignant neoplasm of colon: Secondary | ICD-10-CM

## 2020-05-25 LAB — SARS CORONAVIRUS 2 (TAT 6-24 HRS): SARS Coronavirus 2: NEGATIVE

## 2020-05-26 ENCOUNTER — Ambulatory Visit (HOSPITAL_COMMUNITY): Payer: Medicare Other | Admitting: Anesthesiology

## 2020-05-26 ENCOUNTER — Encounter (HOSPITAL_COMMUNITY): Payer: Self-pay | Admitting: Gastroenterology

## 2020-05-26 ENCOUNTER — Ambulatory Visit (HOSPITAL_COMMUNITY)
Admission: RE | Admit: 2020-05-26 | Discharge: 2020-05-26 | Disposition: A | Discharge status: Home / Self Care | Payer: Medicare Other | Source: Ambulatory Visit | Attending: Gastroenterology | Admitting: Gastroenterology

## 2020-05-26 ENCOUNTER — Encounter (HOSPITAL_COMMUNITY)
Admission: RE | Disposition: A | Discharge status: Home / Self Care | Payer: Self-pay | Source: Ambulatory Visit | Attending: Gastroenterology

## 2020-05-26 DIAGNOSIS — M1990 Unspecified osteoarthritis, unspecified site: Secondary | ICD-10-CM | POA: Diagnosis not present

## 2020-05-26 DIAGNOSIS — Z8249 Family history of ischemic heart disease and other diseases of the circulatory system: Secondary | ICD-10-CM | POA: Diagnosis not present

## 2020-05-26 DIAGNOSIS — D120 Benign neoplasm of cecum: Secondary | ICD-10-CM | POA: Diagnosis not present

## 2020-05-26 DIAGNOSIS — K5730 Diverticulosis of large intestine without perforation or abscess without bleeding: Secondary | ICD-10-CM | POA: Diagnosis not present

## 2020-05-26 DIAGNOSIS — Z79899 Other long term (current) drug therapy: Secondary | ICD-10-CM | POA: Insufficient documentation

## 2020-05-26 DIAGNOSIS — F17210 Nicotine dependence, cigarettes, uncomplicated: Secondary | ICD-10-CM | POA: Insufficient documentation

## 2020-05-26 DIAGNOSIS — Z888 Allergy status to other drugs, medicaments and biological substances status: Secondary | ICD-10-CM | POA: Diagnosis not present

## 2020-05-26 DIAGNOSIS — K219 Gastro-esophageal reflux disease without esophagitis: Secondary | ICD-10-CM | POA: Insufficient documentation

## 2020-05-26 DIAGNOSIS — Z1211 Encounter for screening for malignant neoplasm of colon: Secondary | ICD-10-CM | POA: Diagnosis present

## 2020-05-26 DIAGNOSIS — Z833 Family history of diabetes mellitus: Secondary | ICD-10-CM | POA: Diagnosis not present

## 2020-05-26 DIAGNOSIS — Z880 Allergy status to penicillin: Secondary | ICD-10-CM | POA: Insufficient documentation

## 2020-05-26 HISTORY — PX: POLYPECTOMY: SHX5525

## 2020-05-26 HISTORY — PX: COLONOSCOPY WITH PROPOFOL: SHX5780

## 2020-05-26 LAB — HM COLONOSCOPY

## 2020-05-26 SURGERY — COLONOSCOPY WITH PROPOFOL
Anesthesia: General

## 2020-05-26 MED ORDER — CHLORHEXIDINE GLUCONATE CLOTH 2 % EX PADS: 6.0000 | MEDICATED_PAD | Freq: Once | CUTANEOUS | Status: DC

## 2020-05-26 MED ORDER — PROPOFOL 10 MG/ML IV BOLUS
INTRAVENOUS | Status: DC | PRN
  Administered 2020-05-26: 100 mg via INTRAVENOUS

## 2020-05-26 MED ORDER — LACTATED RINGERS IV SOLN
INTRAVENOUS | Status: DC
  Administered 2020-05-26: 1000 mL via INTRAVENOUS

## 2020-05-26 MED ORDER — PROPOFOL 500 MG/50ML IV EMUL
INTRAVENOUS | Status: DC | PRN
  Administered 2020-05-26: 150 ug/kg/min via INTRAVENOUS

## 2020-05-26 MED ORDER — LIDOCAINE HCL (CARDIAC) PF 100 MG/5ML IV SOSY
PREFILLED_SYRINGE | INTRAVENOUS | Status: DC | PRN
  Administered 2020-05-26: 50 mg via INTRAVENOUS

## 2020-05-26 NOTE — Discharge Instructions (Signed)
You are being discharged to home.  Resume your previous diet.  We are waiting for your pathology results.  Your physician has recommended a repeat colonoscopy for surveillance based on pathology results.       Colonoscopy, Adult, Care After This sheet gives you information about how to care for yourself after your procedure. Your doctor may also give you more specific instructions. If you have problems or questions, call your doctor. What can I expect after the procedure? After the procedure, it is common to have:  A small amount of blood in your poop (stool) for 24 hours.  Some gas.  Mild cramping or bloating in your belly (abdomen). Follow these instructions at home: Eating and drinking  Drink enough fluid to keep your pee (urine) pale yellow.  Follow instructions from your doctor about what you cannot eat or drink.  Return to your normal diet as told by your doctor. Avoid heavy or fried foods that are hard to digest.   Activity  Rest as told by your doctor.  Do not sit for a long time without moving. Get up to take short walks every 1-2 hours. This is important. Ask for help if you feel weak or unsteady.  Return to your normal activities as told by your doctor. Ask your doctor what activities are safe for you. To help cramping and bloating:  Try walking around.  Put heat on your belly as told by your doctor. Use the heat source that your doctor recommends, such as a moist heat pack or a heating pad. ? Put a towel between your skin and the heat source. ? Leave the heat on for 20-30 minutes. ? Remove the heat if your skin turns bright red. This is very important if you are unable to feel pain, heat, or cold. You may have a greater risk of getting burned.   General instructions  If you were given a medicine to help you relax (sedative) during your procedure, it can affect you for many hours. Do not drive or use machinery until your doctor says that it is safe.  For the  first 24 hours after the procedure: ? Do not sign important documents. ? Do not drink alcohol. ? Do your daily activities more slowly than normal. ? Eat foods that are soft and easy to digest.  Take over-the-counter or prescription medicines only as told by your doctor.  Keep all follow-up visits as told by your doctor. This is important. Contact a doctor if:  You have blood in your poop 2-3 days after the procedure. Get help right away if:  You have more than a small amount of blood in your poop.  You see large clumps of tissue (blood clots) in your poop.  Your belly is swollen.  You feel like you may vomit (nauseous).  You vomit.  You have a fever.  You have belly pain that gets worse, and medicine does not help your pain. Summary  After the procedure, it is common to have a small amount of blood in your poop. You may also have mild cramping and bloating in your belly.  If you were given a medicine to help you relax (sedative) during your procedure, it can affect you for many hours. Do not drive or use machinery until your doctor says that it is safe.  Get help right away if you have a lot of blood in your poop, feel like you may vomit, have a fever, or have more belly pain. This  information is not intended to replace advice given to you by your health care provider. Make sure you discuss any questions you have with your health care provider. Document Revised: 11/27/2018 Document Reviewed: 08/17/2018 Elsevier Patient Education  [place].     Colon Polyps  Colon polyps are tissue growths inside the colon, which is part of the large intestine. They are one of the types of polyps that can grow in the body. A polyp may be a round bump or a mushroom-shaped growth. You could have one polyp or more than one. Most colon polyps are noncancerous (benign). However, some colon polyps can become cancerous over time. Finding and removing the polyps early can help prevent  this. What are the causes? The exact cause of colon polyps is not known. What increases the risk? The following factors may make you more likely to develop this condition:  Having a family history of colorectal cancer or colon polyps.  Being older than 67 years of age.  Being younger than 67 years of age and having a significant family history of colorectal cancer or colon polyps or a genetic condition that puts you at higher risk of getting colon polyps.  Having inflammatory bowel disease, such as ulcerative colitis or Crohn's disease.  Having certain conditions passed from parent to child (hereditary conditions), such as: ? Familial adenomatous polyposis (FAP). ? Lynch syndrome. ? Turcot syndrome. ? Peutz-Jeghers syndrome. ? MUTYH-associated polyposis (MAP).  Being overweight.  Certain lifestyle factors. These include smoking cigarettes, drinking too much alcohol, not getting enough exercise, and eating a diet that is high in fat and red meat and low in fiber.  Having had childhood cancer that was treated with radiation of the abdomen. What are the signs or symptoms? Many times, there are no symptoms. If you have symptoms, they may include:  Blood coming from the rectum during a bowel movement.  Blood in the stool (feces). The blood may be bright red or very dark in color.  Pain in the abdomen.  A change in bowel habits, such as constipation or diarrhea. How is this diagnosed? This condition is diagnosed with a colonoscopy. This is a procedure in which a lighted, flexible scope is inserted into the opening between the buttocks (anus) and then passed into the colon to examine the area. Polyps are sometimes found when a colonoscopy is done as part of routine cancer screening tests. How is this treated? This condition is treated by removing any polyps that are found. Most polyps can be removed during a colonoscopy. Those polyps will then be tested for cancer. Additional  treatment may be needed depending on the results of testing. Follow these instructions at home: Eating and drinking  Eat foods that are high in fiber, such as fruits, vegetables, and whole grains.  Eat foods that are high in calcium and vitamin D, such as milk, cheese, yogurt, eggs, liver, fish, and broccoli.  Limit foods that are high in fat, such as fried foods and desserts.  Limit the amount of red meat, precooked or cured meat, or other processed meat that you eat, such as hot dogs, sausages, bacon, or meat loaves.  Limit sugary drinks.   Lifestyle  Maintain a healthy weight, or lose weight if recommended by your health care provider.  Exercise every day or as told by your health care provider.  Do not use any products that contain nicotine or tobacco, such as cigarettes, e-cigarettes, and chewing tobacco. If you need help quitting, ask  your health care provider.  Do not drink alcohol if: ? Your health care provider tells you not to drink. ? You are pregnant, may be pregnant, or are planning to become pregnant.  If you drink alcohol: ? Limit how much you use to:  0-1 drink a day for women.  0-2 drinks a day for men. ? Know how much alcohol is in your drink. In the [place] one drink equals one 12 oz bottle of beer (355 mL), one 5 oz glass of wine (148 mL), or one 1 oz glass of hard liquor (44 mL). General instructions  Take over-the-counter and prescription medicines only as told by your health care provider.  Keep all follow-up visits. This is important. This includes having regularly scheduled colonoscopies. Talk to your health care provider about when you need a colonoscopy. Contact a health care provider if:  You have new or worsening bleeding during a bowel movement.  You have new or increased blood in your stool.  You have a change in bowel habits.  You lose weight for no known reason. Summary  Colon polyps are tissue growths inside the colon, which is part of  the large intestine. They are one type of polyp that can grow in the body.  Most colon polyps are noncancerous (benign), but some can become cancerous over time.  This condition is diagnosed with a colonoscopy.  This condition is treated by removing any polyps that are found. Most polyps can be removed during a colonoscopy. This information is not intended to replace advice given to you by your health care provider. Make sure you discuss any questions you have with your health care provider. Document Revised: 05/12/2019 Document Reviewed: 05/12/2019 Elsevier Patient Education  2021 [place].     Diverticulosis  Diverticulosis is a condition that develops when small pouches (diverticula) form in the wall of the large intestine (colon). The colon is where water is absorbed and stool (feces) is formed. The pouches form when the inside layer of the colon pushes through weak spots in the outer layers of the colon. You may have a few pouches or many of them. The pouches usually do not cause problems unless they become inflamed or infected. When this happens, the condition is called diverticulitis. What are the causes? The cause of this condition is not known. What increases the risk? The following factors may make you more likely to develop this condition:  Being older than age 97. Your risk for this condition increases with age. Diverticulosis is rare among people younger than age 66. By age 61, many people have it.  Eating a low-fiber diet.  Having frequent constipation.  Being overweight.  Not getting enough exercise.  Smoking.  Taking over-the-counter pain medicines, like aspirin and ibuprofen.  Having a family history of diverticulosis. What are the signs or symptoms? In most people, there are no symptoms of this condition. If you do have symptoms, they may include:  Bloating.  Cramps in the abdomen.  Constipation or diarrhea.  Pain in the lower left side of the  abdomen. How is this diagnosed? Because diverticulosis usually has no symptoms, it is most often diagnosed during an exam for other colon problems. The condition may be diagnosed by:  Using a flexible scope to examine the colon (colonoscopy).  Taking an X-ray of the colon after dye has been put into the colon (barium enema).  Having a CT scan. How is this treated? You may not need treatment for this condition. Your  health care provider may recommend treatment to prevent problems. You may need treatment if you have symptoms or if you previously had diverticulitis. Treatment may include:  Eating a high-fiber diet.  Taking a fiber supplement.  Taking a live bacteria supplement (probiotic).  Taking medicine to relax your colon.   Follow these instructions at home: Medicines  Take over-the-counter and prescription medicines only as told by your health care provider.  If told by your health care provider, take a fiber supplement or probiotic. Constipation prevention Your condition may cause constipation. To prevent or treat constipation, you may need to:  Drink enough fluid to keep your urine pale yellow.  Take over-the-counter or prescription medicines.  Eat foods that are high in fiber, such as beans, whole grains, and fresh fruits and vegetables.  Limit foods that are high in fat and processed sugars, such as fried or sweet foods.   General instructions  Try not to strain when you have a bowel movement.  Keep all follow-up visits as told by your health care provider. This is important. Contact a health care provider if you:  Have pain in your abdomen.  Have bloating.  Have cramps.  Have not had a bowel movement in 3 days. Get help right away if:  Your pain gets worse.  Your bloating becomes very bad.  You have a fever or chills, and your symptoms suddenly get worse.  You vomit.  You have bowel movements that are bloody or black.  You have bleeding from your  rectum. Summary  Diverticulosis is a condition that develops when small pouches (diverticula) form in the wall of the large intestine (colon).  You may have a few pouches or many of them.  This condition is most often diagnosed during an exam for other colon problems.  Treatment may include increasing the fiber in your diet, taking supplements, or taking medicines. This information is not intended to replace advice given to you by your health care provider. Make sure you discuss any questions you have with your health care provider. Document Revised: 08/20/2018 Document Reviewed: 08/20/2018 Elsevier Patient Education  2021 [place] After This sheet gives you information about how to care for yourself after your procedure. Your health care provider may also give you more specific instructions. If you have problems or questions, contact your health care provider. What can I expect after the procedure? After the procedure, it is common to have:  Tiredness.  Forgetfulness about what happened after the procedure.  Impaired judgment for important decisions.  Nausea or vomiting.  Some difficulty with balance. Follow these instructions at home: For the time period you were told by your health care provider:  Rest as needed.  Do not participate in activities where you could fall or become injured.  Do not drive or use machinery.  Do not drink alcohol.  Do not take sleeping pills or medicines that cause drowsiness.  Do not make important decisions or sign legal documents.  Do not take care of children on your own.      Eating and drinking  Follow the diet that is recommended by your health care provider.  Drink enough fluid to keep your urine pale yellow.  If you vomit: ? Drink water, juice, or soup when you can drink without vomiting. ? Make sure you have little or no nausea before eating solid foods. General  instructions  Have a responsible adult stay with you for the time  you are told. It is important to have someone help care for you until you are awake and alert.  Take over-the-counter and prescription medicines only as told by your health care provider.  If you have sleep apnea, surgery and certain medicines can increase your risk for breathing problems. Follow instructions from your health care provider about wearing your sleep device: ? Anytime you are sleeping, including during daytime naps. ? While taking prescription pain medicines, sleeping medicines, or medicines that make you drowsy.  Avoid smoking.  Keep all follow-up visits as told by your health care provider. This is important. Contact a health care provider if:  You keep feeling nauseous or you keep vomiting.  You feel light-headed.  You are still sleepy or having trouble with balance after 24 hours.  You develop a rash.  You have a fever.  You have redness or swelling around the IV site. Get help right away if:  You have trouble breathing.  You have new-onset confusion at home. Summary  For several hours after your procedure, you may feel tired. You may also be forgetful and have poor judgment.  Have a responsible adult stay with you for the time you are told. It is important to have someone help care for you until you are awake and alert.  Rest as told. Do not drive or operate machinery. Do not drink alcohol or take sleeping pills.  Get help right away if you have trouble breathing, or if you suddenly become confused. This information is not intended to replace advice given to you by your health care provider. Make sure you discuss any questions you have with your health care provider. Document Revised: 10/07/2019 Document Reviewed: 12/24/2018 Elsevier Patient Education  2021 Elsevier Inc.    

## 2020-05-26 NOTE — Op Note (Signed)
[facility] - Inpatient Patient Name: [name] Procedure Date: 05/26/2020 12:54 PM MRN: [id number] Date of Birth: [birth year] Attending MD: Maylon Peppers ,  CSN: [id number] Age: 65 Admit Type: Outpatient Procedure:                Colonoscopy Indications:              Screening for colorectal malignant neoplasm Providers:                Maylon Peppers, Letona Sharon Seller, RN, Raphael Gibney, Technician Referring MD:              Medicines:                Monitored Anesthesia Care Complications:            No immediate complications. Estimated Blood Loss:     Estimated blood loss: none. Procedure:                Pre-Anesthesia Assessment:                           - Prior to the procedure, a History and Physical                            was performed, and patient medications, allergies                            and sensitivities were reviewed. The patient's                            tolerance of previous anesthesia was reviewed.                           - The risks and benefits of the procedure and the                            sedation options and risks were discussed with the                            patient. All questions were answered and informed                            consent was obtained.                           - ASA Grade Assessment: II - A patient with mild                            systemic disease.                           After obtaining informed consent, the colonoscope                            was passed under direct vision. Throughout the  procedure, the patient's blood pressure, pulse, and                            oxygen saturations were monitored continuously.The                            colonoscopy was performed without difficulty. The                            patient tolerated the procedure well. The quality                            of the bowel preparation was adequate. The                             [id number] scope was introduced through                            the anus and advanced to the the cecum, identified                            by appendiceal orifice and ileocecal valve. Scope In: 3:06:30 PM Scope Out: 3:24:09 PM Scope Withdrawal Time: 0 hours 12 minutes 50 seconds  Total Procedure Duration: 0 hours 17 minutes 39 seconds  Findings:      The perianal and digital rectal examinations were normal.      A 2 mm polyp was found in the cecum. The polyp was sessile. The polyp       was removed with a cold snare. Resection and retrieval were complete.      A few small-mouthed diverticula were found in the sigmoid colon.      The retroflexed view of the distal rectum and anal verge was normal and       showed no anal or rectal abnormalities. Impression:               - One 2 mm polyp in the cecum, removed with a cold                            snare. Resected and retrieved.                           - Diverticulosis in the sigmoid colon.                           - The distal rectum and anal verge are normal on                            retroflexion view. Moderate Sedation:      Per Anesthesia Care Recommendation:           - Discharge patient to home (ambulatory).                           - Resume previous diet.                           -  Await pathology results.                           - Repeat colonoscopy for surveillance based on                            pathology results. Procedure Code(s):        --- Professional ---                           [phone], Colonoscopy, flexible; with removal of                            tumor(s), polyp(s), or other lesion(s) by snare                            technique Diagnosis Code(s):        --- Professional ---                           Z12.11, Encounter for screening for malignant                            neoplasm of colon                           K63.5, Polyp of colon                            K57.30, Diverticulosis of large intestine without                            perforation or abscess without bleeding CPT copyright 2019 [employer]. All rights reserved. The codes documented in this report are preliminary and upon coder review may  be revised to meet current compliance requirements. Maylon Peppers, MD Maylon Peppers,  05/26/2020 3:28:48 PM This report has been signed electronically. Number of Addenda: 0

## 2020-05-26 NOTE — H&P (Addendum)
[name] is an 65 y.o. female.   Chief Complaint: Colorectal cancer screening HPI: 65 year old female with past medical history of GERD and arthritis, coming for screening colonoscopy.  Last colonoscopy was 11 years ago, the patient believes the evaluation was normal.  The patient denies having any complaints such as melena, hematochezia, abdominal pain or distention, change in her bowel movement consistency or frequency, no changes in her weight recently.  No family history of colorectal cancer.   Past Medical History:  Diagnosis Date  . Allergic rhinitis   . Arthritis   . GERD (gastroesophageal reflux disease)   . Glaucoma   . Skipped heart beats   . Venous stasis     Past Surgical History:  Procedure Laterality Date  . BACK SURGERY     cervical fusion   . CHOLECYSTECTOMY    . COLONOSCOPY     Hx:of  . EXTERNAL FIXATION LEG Left 10/20/2012   Procedure: EXTERNAL FIXATION Left Tibial Plateau Fx;  Surgeon: Rozanna Box, MD;  Location: [place];  Service: Orthopedics;  Laterality: Left;  . EXTERNAL FIXATION REMOVAL Left 11/03/2012   Procedure: REMOVAL EXTERNAL FIXATION LEG;  Surgeon: Rozanna Box, MD;  Location: [place];  Service: Orthopedics;  Laterality: Left;  . JOINT REPLACEMENT Bilateral    Hips   . LEFT HEART CATH AND CORONARY ANGIOGRAPHY N/A 08/09/2016   Procedure: Left Heart Cath and Coronary Angiography;  Surgeon: Jettie Booze, MD;  Location: [facility];  Service: Cardiovascular;  Laterality: N/A;  . OPEN REDUCTION INTERNAL FIXATION (ORIF) DISTAL RADIAL FRACTURE Right 05/23/2017   Procedure: OPEN REDUCTION INTERNAL FIXATION (ORIF) DISTAL RADIAL FRACTURE;  Surgeon: Milly Jakob, MD;  Location: [place];  Service: Orthopedics;  Laterality: Right;  . ORIF TIBIA PLATEAU Left 11/03/2012   Dr Marcelino Scot  . ORIF TIBIA PLATEAU Left 11/03/2012   Procedure: OPEN REDUCTION INTERNAL FIXATION (ORIF) TIBIAL PLATEAU FRACTURE AND REMOVAL OF EXTERNAL FIXATION ;  Surgeon:  Rozanna Box, MD;  Location: [place];  Service: Orthopedics;  Laterality: Left;  . SHOULDER SURGERY    . TUBAL LIGATION    . vocal cord surgery      Family History  Problem Relation Age of Onset  . Heart attack Father   . Heart attack Mother   . Diabetes Brother    Social History:  reports that she has been smoking cigarettes. She has smoked for the past 2.00 years. She has never used smokeless tobacco. She reports that she does not drink alcohol and does not use drugs.  Allergies:  Allergies  Allergen Reactions  . Prednisone Shortness Of Breath  . Amoxicillin Rash and Other (See Comments)    Has patient had a PCN reaction causing immediate rash, facial/tongue/throat swelling, SOB or lightheadedness with hypotension: No Has patient had a PCN reaction causing severe rash involving mucus membranes or skin necrosis: No Has patient had a PCN reaction that required hospitalization: No Has patient had a PCN reaction occurring within the last 10 years: No If all of the above answers are "NO", then may proceed with Cephalosporin use.     Medications Prior to Admission  Medication Sig Dispense Refill  . atorvastatin (LIPITOR) 40 MG tablet Take 1 tablet (40 mg total) by mouth daily. 90 tablet 3  . famotidine (PEPCID) 20 MG tablet Take 20 mg by mouth daily.    . metoprolol tartrate (LOPRESSOR) 25 MG tablet Take 0.5 tablets (12.5 mg total) by mouth 2 (two) times daily. Donora  tablet 3  . polyethylene glycol-electrolytes (TRILYTE) 420 g solution Take 4,000 mLs by mouth as directed. 4000 mL 0  . TRAVATAN Z 0.004 % SOLN ophthalmic solution Place 1 drop into both eyes at bedtime.       Results for orders placed or performed during the hospital encounter of 05/24/20 (from the past 48 hour(s))  SARS CORONAVIRUS 2 (TAT 6-24 HRS) Nasopharyngeal Nasopharyngeal Swab     Status: None   Collection Time: 05/24/20  2:50 PM   Specimen: Nasopharyngeal Swab  Result Value Ref Range   SARS Coronavirus 2  NEGATIVE NEGATIVE    Comment: (NOTE) SARS-CoV-2 target nucleic acids are NOT DETECTED.  The SARS-CoV-2 RNA is generally detectable in upper and lower respiratory specimens during the acute phase of infection. Negative results do not preclude SARS-CoV-2 infection, do not rule out co-infections with other pathogens, and should not be used as the sole basis for treatment or other patient management decisions. Negative results must be combined with clinical observations, patient history, and epidemiological information. The expected result is Negative.  Fact Sheet for Patients: SugarRoll.be  Fact Sheet for Healthcare Providers: https://www.woods-mathews.com/  This test is not yet approved or cleared by the [place] FDA and  has been authorized for detection and/or diagnosis of SARS-CoV-2 by FDA under an Emergency Use Authorization (EUA). This EUA will remain  in effect (meaning this test can be used) for the duration of the COVID-19 declaration under Se ction 564(b)(1) of the Act, 21 U.S.C. section 360bbb-3(b)(1), unless the authorization is terminated or revoked sooner.  Performed at [facility], [place] [address], [place],  [address]    No results found.  Review of Systems  Constitutional: Negative.   HENT: Negative.   Respiratory: Negative.   Cardiovascular: Negative.   Gastrointestinal: Negative.   Endocrine: Negative.   Genitourinary: Negative.   Musculoskeletal: Negative.   Skin: Negative.   Allergic/Immunologic: Negative.   Neurological: Negative.   Hematological: Negative.   Psychiatric/Behavioral: Negative.     Blood pressure (!) 121/52, temperature 98.6 F (37 C), temperature source Oral, resp. rate 18, SpO2 99 %. Physical Exam  GENERAL: The patient is AO x3, in no acute distress. HEENT: Head is normocephalic and atraumatic. EOMI are intact. Mouth is well hydrated and without lesions. NECK: Supple. No  masses LUNGS: Clear to auscultation. No presence of rhonchi/wheezing/rales. Adequate chest expansion HEART: RRR, normal s1 and s2. ABDOMEN: Soft, nontender, no guarding, no peritoneal signs, and nondistended. BS +. No masses. EXTREMITIES: Without any cyanosis, clubbing, rash, lesions or edema. NEUROLOGIC: AOx3, no focal motor deficit. SKIN: no jaundice, no rashes  Assessment/Plan 65 year old female with past medical history of GERD and arthritis, coming for screening colonoscopy. The patient is at average risk for colorectal cancer.  We will proceed with colonoscopy today.   Harvel Quale, MD 05/26/2020, 1:59 PM

## 2020-05-26 NOTE — Anesthesia Procedure Notes (Signed)
Date/Time: 05/26/2020 3:08 PM Performed by: Orlie Dakin, CRNA Pre-anesthesia Checklist: Patient identified, Emergency Drugs available, Suction available and Patient being monitored Patient Re-evaluated:Patient Re-evaluated prior to induction Oxygen Delivery Method: Nasal cannula Induction Type: IV induction Placement Confirmation: positive ETCO2

## 2020-05-26 NOTE — Transfer of Care (Signed)
Immediate Anesthesia Transfer of Care Note  Patient: [name]  Procedure(s) Performed: COLONOSCOPY WITH PROPOFOL (N/A ) POLYPECTOMY  Patient Location: Short Stay  Anesthesia Type:General  Level of Consciousness: awake, alert  and oriented  Airway & Oxygen Therapy: Patient Spontanous Breathing  Post-op Assessment: Report given to RN, Post -op Vital signs reviewed and stable and Patient moving all extremities X 4  Post vital signs: Reviewed and stable  Last Vitals:  Vitals Value Taken Time  BP    Temp    Pulse    Resp    SpO2      Last Pain:  Vitals:   05/26/20 1502  TempSrc:   PainSc: 0-No pain      Patients Stated Pain Goal: 6 (68/03/21 2248)  Complications: No complications documented.

## 2020-05-26 NOTE — Anesthesia Preprocedure Evaluation (Signed)
Anesthesia Evaluation  Patient identified by MRN, date of birth, ID band Patient awake    Reviewed: Allergy & Precautions, H&P , NPO status , Patient's Chart, lab work & pertinent test results, reviewed documented beta blocker date and time   Airway Mallampati: II  TM Distance: >3 FB Neck ROM: full    Dental no notable dental hx.    Pulmonary shortness of breath, Current Smoker,    Pulmonary exam normal breath sounds clear to auscultation       Cardiovascular Exercise Tolerance: Good negative cardio ROS   Rhythm:regular Rate:Normal     Neuro/Psych negative neurological ROS  negative psych ROS   GI/Hepatic Neg liver ROS, GERD  Medicated,  Endo/Other  negative endocrine ROS  Renal/GU negative Renal ROS  negative genitourinary   Musculoskeletal   Abdominal   Peds  Hematology negative hematology ROS (+)   Anesthesia Other Findings   Reproductive/Obstetrics negative OB ROS                             Anesthesia Physical Anesthesia Plan  ASA: II  Anesthesia Plan: General   Post-op Pain Management:    Induction:   PONV Risk Score and Plan: Propofol infusion  Airway Management Planned:   Additional Equipment:   Intra-op Plan:   Post-operative Plan:   Informed Consent: I have reviewed the patients History and Physical, chart, labs and discussed the procedure including the risks, benefits and alternatives for the proposed anesthesia with the patient or authorized representative who has indicated his/her understanding and acceptance.     Dental Advisory Given  Plan Discussed with: CRNA  Anesthesia Plan Comments:         Anesthesia Quick Evaluation

## 2020-05-26 NOTE — Anesthesia Postprocedure Evaluation (Signed)
Anesthesia Post Note  Patient: [name]  Procedure(s) Performed: COLONOSCOPY WITH PROPOFOL (N/A ) POLYPECTOMY  Patient location during evaluation: Phase II Anesthesia Type: General Level of consciousness: awake Pain management: pain level controlled Vital Signs Assessment: post-procedure vital signs reviewed and stable Respiratory status: spontaneous breathing and respiratory function stable Cardiovascular status: blood pressure returned to baseline and stable Postop Assessment: no headache and no apparent nausea or vomiting Anesthetic complications: no Comments: Late entry   No complications documented.   Last Vitals:  Vitals:   05/26/20 1304  BP: (!) 121/52  Resp: 18  Temp: 37 C  SpO2: 99%    Last Pain:  Vitals:   05/26/20 1502  TempSrc:   PainSc: 0-No pain                 Louann Sjogren

## 2020-05-30 LAB — SURGICAL PATHOLOGY

## 2020-05-31 ENCOUNTER — Encounter (INDEPENDENT_AMBULATORY_CARE_PROVIDER_SITE_OTHER): Payer: Self-pay | Admitting: *Deleted

## 2020-05-31 ENCOUNTER — Encounter (HOSPITAL_COMMUNITY): Payer: Self-pay | Admitting: Gastroenterology
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
## 2020-08-22 ENCOUNTER — Telehealth: Payer: Self-pay | Admitting: Family Medicine

## 2020-08-22 DIAGNOSIS — Z0000 Encounter for general adult medical examination without abnormal findings: Secondary | ICD-10-CM

## 2020-08-22 DIAGNOSIS — Z79899 Other long term (current) drug therapy: Secondary | ICD-10-CM

## 2020-08-22 DIAGNOSIS — R7989 Other specified abnormal findings of blood chemistry: Secondary | ICD-10-CM

## 2020-08-22 DIAGNOSIS — Z13220 Encounter for screening for lipoid disorders: Secondary | ICD-10-CM

## 2020-08-22 NOTE — Telephone Encounter (Signed)
Patient wanting to know if she needed labs for 6 month follow up on 7/26 . Please advise

## 2020-08-22 NOTE — Telephone Encounter (Signed)
Last labs 02/15/20: Lipid, TSH, CMP, CBC

## 2020-08-24 NOTE — Telephone Encounter (Signed)
error 

## 2020-08-24 NOTE — Telephone Encounter (Signed)
Pt returned call and verbalized understanding  

## 2020-08-24 NOTE — Telephone Encounter (Signed)
Lab order placed. Left message to return call

## 2020-08-25 NOTE — Telephone Encounter (Deleted)
Yes, pls give orders for those labs. Thx. Dr. Lovena Le

## 2020-08-26 LAB — COMPREHENSIVE METABOLIC PANEL
ALT: 18 IU/L (ref 0–32)
AST: 17 IU/L (ref 0–40)
Albumin/Globulin Ratio: 1.7 (ref 1.2–2.2)
Albumin: 4.4 g/dL (ref 3.8–4.8)
Alkaline Phosphatase: 90 IU/L (ref 44–121)
BUN/Creatinine Ratio: 14 (ref 12–28)
BUN: 14 mg/dL (ref 8–27)
Bilirubin Total: 0.5 mg/dL (ref 0.0–1.2)
CO2: 23 mmol/L (ref 20–29)
Calcium: 10 mg/dL (ref 8.7–10.3)
Chloride: 102 mmol/L (ref 96–106)
Creatinine, Ser: 1.02 mg/dL — ABNORMAL HIGH (ref 0.57–1.00)
Globulin, Total: 2.6 g/dL (ref 1.5–4.5)
Glucose: 127 mg/dL — ABNORMAL HIGH (ref 65–99)
Potassium: 5.2 mmol/L (ref 3.5–5.2)
Sodium: 138 mmol/L (ref 134–144)
Total Protein: 7 g/dL (ref 6.0–8.5)
eGFR: 61 mL/min/{1.73_m2} (ref >59)

## 2020-08-26 LAB — CBC WITH DIFFERENTIAL/PLATELET
Basophils Absolute: 0.1 10*3/uL (ref 0.0–0.2)
Basos: 1 %
EOS (ABSOLUTE): 0.3 10*3/uL (ref 0.0–0.4)
Eos: 3 %
Hematocrit: 40 % (ref 34.0–46.6)
Hemoglobin: 13.9 g/dL (ref 11.1–15.9)
Immature Grans (Abs): 0 10*3/uL (ref 0.0–0.1)
Immature Granulocytes: 0 %
Lymphocytes Absolute: 3.1 10*3/uL (ref 0.7–3.1)
Lymphs: 37 %
MCH: 31.4 pg (ref 26.6–33.0)
MCHC: 34.8 g/dL (ref 31.5–35.7)
MCV: 90 fL (ref 79–97)
Monocytes Absolute: 0.5 10*3/uL (ref 0.1–0.9)
Monocytes: 6 %
Neutrophils Absolute: 4.5 10*3/uL (ref 1.4–7.0)
Neutrophils: 53 %
Platelets: 220 10*3/uL (ref 150–450)
RBC: 4.43 x10E6/uL (ref 3.77–5.28)
RDW: 13.2 % (ref 11.7–15.4)
WBC: 8.4 10*3/uL (ref 3.4–10.8)

## 2020-08-26 LAB — TSH: TSH: 2.9 u[IU]/mL (ref 0.450–4.500)

## 2020-08-26 LAB — LIPID PANEL
Chol/HDL Ratio: 3.4 ratio (ref 0.0–4.4)
Cholesterol, Total: 125 mg/dL (ref 100–199)
HDL: 37 mg/dL — ABNORMAL LOW (ref >39)
LDL Chol Calc (NIH): 47 mg/dL (ref 0–99)
Triglycerides: 261 mg/dL — ABNORMAL HIGH (ref 0–149)
VLDL Cholesterol Cal: 41 mg/dL — ABNORMAL HIGH (ref 5–40)

## 2020-08-29 ENCOUNTER — Ambulatory Visit (INDEPENDENT_AMBULATORY_CARE_PROVIDER_SITE_OTHER): Payer: Medicare Other | Admitting: Family Medicine

## 2020-08-29 ENCOUNTER — Other Ambulatory Visit: Payer: Self-pay

## 2020-08-29 ENCOUNTER — Encounter: Payer: Self-pay | Admitting: Family Medicine

## 2020-08-29 VITALS — BP 127/76 | HR 59 | Temp 97.2°F | Ht 63.0 in | Wt 242.0 lb

## 2020-08-29 DIAGNOSIS — R7301 Impaired fasting glucose: Secondary | ICD-10-CM

## 2020-08-29 DIAGNOSIS — K219 Gastro-esophageal reflux disease without esophagitis: Secondary | ICD-10-CM

## 2020-08-29 DIAGNOSIS — E782 Mixed hyperlipidemia: Secondary | ICD-10-CM | POA: Diagnosis not present

## 2020-08-29 NOTE — Progress Notes (Signed)
Patient ID: [name], female    DOB: Nov 04, 1955, 65 y.o.   MRN: [id number]   Chief Complaint  Patient presents with   Hyperlipidemia   Subjective:    HPI  HLD- doing well no new concerns. Compliant with meds. No chest pain, palpitations, myalgias or joint pains.  Was prediabetic in 1984, and hasn't had issues with it since then.  No alcohol intake.  Does run in family- type 2 dm.  Pepcid otc. For her gerd.  Stable.  H/o PVCs and seeing cards- taking lopressor.  Stable  Medical History Ronni has a past medical history of Allergic rhinitis, Arthritis, GERD (gastroesophageal reflux disease), Glaucoma, Skipped heart beats, and Venous stasis.   Outpatient Encounter Medications as of 08/29/2020  Medication Sig   atorvastatin (LIPITOR) 40 MG tablet Take 1 tablet (40 mg total) by mouth daily.   famotidine (PEPCID) 20 MG tablet Take 20 mg by mouth daily.   metoprolol tartrate (LOPRESSOR) 25 MG tablet Take 0.5 tablets (12.5 mg total) by mouth 2 (two) times daily.   TRAVATAN Z 0.004 % SOLN ophthalmic solution Place 1 drop into both eyes at bedtime.    No facility-administered encounter medications on file as of 08/29/2020.     Review of Systems  Constitutional:  Negative for chills and fever.  HENT:  Negative for congestion, rhinorrhea and sore throat.   Respiratory:  Negative for cough, shortness of breath and wheezing.   Cardiovascular:  Negative for chest pain and leg swelling.  Gastrointestinal:  Negative for abdominal pain, diarrhea, nausea and vomiting.  Genitourinary:  Negative for dysuria and frequency.  Musculoskeletal:  Negative for arthralgias and back pain.  Skin:  Negative for rash.  Neurological:  Negative for dizziness, weakness and headaches.    Vitals BP 127/76   Pulse (!) 59   Temp (!) 97.2 F (36.2 C)   Ht '5\' 3"'$  (1.6 m)   Wt 242 lb (109.8 kg)   LMP  (LMP Unknown) Comment: tubal ligation  SpO2 99%   BMI 42.87 kg/m   Objective:    Physical Exam Vitals and nursing note reviewed.  Constitutional:      Appearance: Normal appearance.  HENT:     Head: Normocephalic and atraumatic.     Nose: Nose normal.     Mouth/Throat:     Mouth: Mucous membranes are moist.     Pharynx: Oropharynx is clear.  Eyes:     Extraocular Movements: Extraocular movements intact.     Conjunctiva/sclera: Conjunctivae normal.     Pupils: Pupils are equal, round, and reactive to light.  Cardiovascular:     Rate and Rhythm: Regular rhythm. Bradycardia present.     Pulses: Normal pulses.     Heart sounds: Normal heart sounds.  Pulmonary:     Effort: Pulmonary effort is normal.     Breath sounds: Normal breath sounds. No wheezing, rhonchi or rales.  Musculoskeletal:        General: Normal range of motion.     Right lower leg: No edema.     Left lower leg: No edema.  Skin:    General: Skin is warm and dry.     Findings: No lesion or rash.  Neurological:     General: No focal deficit present.     Mental Status: She is alert and oriented to person, place, and time.  Psychiatric:        Mood and Affect: Mood normal.        Behavior: Behavior  normal.     Assessment and Plan   1. Mixed hyperlipidemia  2. Elevated fasting glucose - Hemoglobin A1c  3. Gastroesophageal reflux disease, unspecified whether esophagitis present   HLD- improved chol/ and LDL. Cont meds.   Elevated TG- cont to watch carbs in diet and inc in exercising.  Prediab- a1c at 6.0. Cont to dec carbs in diet and increase in exercising.  Gerd- stable. Cont pepcid prn. Avoid greasy, spicy, and acidic foods, caffeine, alcohol.   Return in about 6 months (around 03/01/2021).

## 2020-08-30 LAB — HEMOGLOBIN A1C
Est. average glucose Bld gHb Est-mCnc: 126 mg/dL
Hgb A1c MFr Bld: 6 % — ABNORMAL HIGH (ref 4.8–5.6)

## 2020-09-09 DIAGNOSIS — E781 Pure hyperglyceridemia: Secondary | ICD-10-CM | POA: Insufficient documentation
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
## 2020-12-11 ENCOUNTER — Other Ambulatory Visit: Payer: Self-pay

## 2020-12-11 ENCOUNTER — Ambulatory Visit (INDEPENDENT_AMBULATORY_CARE_PROVIDER_SITE_OTHER): Payer: Medicare Other | Admitting: Cardiovascular Disease

## 2020-12-11 ENCOUNTER — Encounter: Payer: Self-pay | Admitting: Cardiovascular Disease

## 2020-12-11 VITALS — BP 150/72 | HR 58 | Ht 63.0 in | Wt 236.4 lb

## 2020-12-11 DIAGNOSIS — I10 Essential (primary) hypertension: Secondary | ICD-10-CM | POA: Diagnosis not present

## 2020-12-11 DIAGNOSIS — R002 Palpitations: Secondary | ICD-10-CM

## 2020-12-11 DIAGNOSIS — E782 Mixed hyperlipidemia: Secondary | ICD-10-CM

## 2020-12-11 NOTE — Patient Instructions (Signed)
Medication Instructions:  *If you need a refill on your cardiac medications before your next appointment, please call your pharmacy*  Lab Work: If you have labs (blood work) drawn today and your tests are completely normal, you will receive your results only by: [place] (if you have MyChart) OR A paper copy in the mail If you have any lab test that is abnormal or we need to change your treatment, we will call you to review the results.  Follow-Up: At [facility], you and your health needs are our priority.  As part of our continuing mission to provide you with exceptional heart care, we have created designated Provider Care Teams.  These Care Teams include your primary Cardiologist (physician) and Advanced Practice Providers (APPs -  Physician Assistants and Nurse Practitioners) who all work together to provide you with the care you need, when you need it.  We recommend signing up for the patient portal called "MyChart".  Sign up information is provided on this After Visit Summary.  MyChart is used to connect with patients for Virtual Visits (Telemedicine).  Patients are able to view lab/test results, encounter notes, upcoming appointments, etc.  Non-urgent messages can be sent to your provider as well.   To learn more about what you can do with MyChart, go to [email or web address].    Your next appointment:   1 year  The format for your next appointment:   In Person  Provider:   Dr. Johnsie Cancel

## 2020-12-20 ENCOUNTER — Other Ambulatory Visit: Payer: Self-pay | Admitting: Cardiovascular Disease

## 2021-03-05 ENCOUNTER — Encounter: Payer: Self-pay | Admitting: *Deleted

## 2021-03-05 NOTE — Progress Notes (Signed)
This encounter was created in error - please disregard.
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
## 2021-12-10 ENCOUNTER — Encounter: Payer: Self-pay | Admitting: Cardiovascular Disease

## 2021-12-10 ENCOUNTER — Ambulatory Visit: Payer: Medicare Other | Attending: Cardiovascular Disease | Admitting: Cardiovascular Disease

## 2021-12-10 VITALS — BP 140/82 | HR 62 | Ht 63.0 in | Wt 247.0 lb

## 2021-12-10 DIAGNOSIS — R002 Palpitations: Secondary | ICD-10-CM | POA: Diagnosis present

## 2021-12-10 DIAGNOSIS — E782 Mixed hyperlipidemia: Secondary | ICD-10-CM | POA: Diagnosis present

## 2021-12-10 DIAGNOSIS — I10 Essential (primary) hypertension: Secondary | ICD-10-CM | POA: Diagnosis present

## 2021-12-10 DIAGNOSIS — I493 Ventricular premature depolarization: Secondary | ICD-10-CM

## 2021-12-10 NOTE — Patient Instructions (Signed)
Medication Instructions:  Your physician recommends that you continue on your current medications as directed. Please refer to the Current Medication list given to you today.   Labwork: None today  Testing/Procedures: None today  Follow-Up: 1 year  Any Other Special Instructions Will Be Listed Below (If Applicable).  If you need a refill on your cardiac medications before your next appointment, please call your pharmacy.  

## 2021-12-17 ENCOUNTER — Other Ambulatory Visit: Payer: Self-pay | Admitting: Cardiovascular Disease

## 2022-12-10 ENCOUNTER — Other Ambulatory Visit: Payer: Self-pay | Admitting: Cardiovascular Disease
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
## 2022-12-16 ENCOUNTER — Encounter: Payer: Self-pay | Admitting: Cardiovascular Disease

## 2022-12-16 ENCOUNTER — Ambulatory Visit: Payer: Medicare Other | Attending: Cardiovascular Disease | Admitting: Cardiovascular Disease

## 2022-12-16 VITALS — BP 130/80 | HR 56 | Ht 63.0 in | Wt 238.2 lb

## 2022-12-16 DIAGNOSIS — E782 Mixed hyperlipidemia: Secondary | ICD-10-CM | POA: Insufficient documentation

## 2022-12-16 DIAGNOSIS — I10 Essential (primary) hypertension: Secondary | ICD-10-CM | POA: Insufficient documentation

## 2022-12-16 DIAGNOSIS — R002 Palpitations: Secondary | ICD-10-CM | POA: Insufficient documentation

## 2022-12-16 DIAGNOSIS — I493 Ventricular premature depolarization: Secondary | ICD-10-CM | POA: Insufficient documentation

## 2022-12-16 MED ORDER — METOPROLOL TARTRATE 25 MG PO TABS: 12.5000 mg | ORAL_TABLET | Freq: Two times a day (BID) | ORAL | 3 refills | Status: DC

## 2022-12-16 MED ORDER — ATORVASTATIN CALCIUM 40 MG PO TABS: 40.0000 mg | ORAL_TABLET | Freq: Every day | ORAL | 3 refills | Status: DC

## 2022-12-16 NOTE — Patient Instructions (Signed)
Medication Instructions:  Your physician recommends that you continue on your current medications as directed. Please refer to the Current Medication list given to you today.  *If you need a refill on your cardiac medications before your next appointment, please call your pharmacy*   Lab Work: NONE   If you have labs (blood work) drawn today and your tests are completely normal, you will receive your results only by: MyChart Message (if you have MyChart) OR A paper copy in the mail If you have any lab test that is abnormal or we need to change your treatment, we will call you to review the results.   Testing/Procedures: NONE    Follow-Up: At [facility], you and your health needs are our priority.  As part of our continuing mission to provide you with exceptional heart care, we have created designated Provider Care Teams.  These Care Teams include your primary Cardiologist (physician) and Advanced Practice Providers (APPs -  Physician Assistants and Nurse Practitioners) who all work together to provide you with the care you need, when you need it.  We recommend signing up for the patient portal called "MyChart".  Sign up information is provided on this After Visit Summary.  MyChart is used to connect with patients for Virtual Visits (Telemedicine).  Patients are able to view lab/test results, encounter notes, upcoming appointments, etc.  Non-urgent messages can be sent to your provider as well.   To learn more about what you can do with MyChart, go to [email or web address].    Your next appointment:   1 year(s)  Provider:   You may see Peter Nishan, MD or one of the following Advanced Practice Providers on your designated Care Team:   Brittany Strader, PA-C  Michele Lenze, PA-C     Other Instructions Thank you for choosing [facility]!    

## 2023-11-07 LAB — OPHTHALMOLOGY REPORT-SCANNED

## 2023-12-30 ENCOUNTER — Other Ambulatory Visit: Payer: Self-pay | Admitting: Cardiovascular Disease
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
## 2024-02-09 ENCOUNTER — Ambulatory Visit: Attending: Cardiovascular Disease | Admitting: Cardiovascular Disease

## 2024-02-09 ENCOUNTER — Encounter: Payer: Self-pay | Admitting: Cardiovascular Disease

## 2024-02-09 VITALS — BP 166/82 | HR 68 | Ht 63.0 in | Wt 232.8 lb

## 2024-02-09 DIAGNOSIS — I493 Ventricular premature depolarization: Secondary | ICD-10-CM | POA: Insufficient documentation

## 2024-02-09 DIAGNOSIS — I10 Essential (primary) hypertension: Secondary | ICD-10-CM | POA: Diagnosis not present

## 2024-02-09 DIAGNOSIS — R002 Palpitations: Secondary | ICD-10-CM | POA: Insufficient documentation

## 2024-02-09 DIAGNOSIS — E782 Mixed hyperlipidemia: Secondary | ICD-10-CM | POA: Insufficient documentation

## 2024-02-09 MED ORDER — METOPROLOL TARTRATE 25 MG PO TABS: 12.5000 mg | ORAL_TABLET | Freq: Two times a day (BID) | ORAL | 3 refills | Status: AC

## 2024-02-09 MED ORDER — ATORVASTATIN CALCIUM 40 MG PO TABS: 40.0000 mg | ORAL_TABLET | Freq: Every day | ORAL | 3 refills | Status: AC

## 2024-02-09 NOTE — Patient Instructions (Addendum)
 Medication Instructions:  Your physician recommends that you continue on your current medications as directed. Please refer to the Current Medication list given to you today.  *If you need a refill on your cardiac medications before your next appointment, please call your pharmacy*  Lab Work: None today If you have labs (blood work) drawn today and your tests are completely normal, you will receive your results only by: MyChart Message (if you have MyChart) OR A paper copy in the mail If you have any lab test that is abnormal or we need to change your treatment, we will call you to review the results.  Testing/Procedures: None today  Follow-Up: At [facility], you and your health needs are our priority.  As part of our continuing mission to provide you with exceptional heart care, our providers are all part of one team.  This team includes your primary Cardiologist (physician) and Advanced Practice Providers or APPs (Physician Assistants and Nurse Practitioners) who all work together to provide you with the care you need, when you need it.  Your next appointment:   1 year(s)  Provider:   You may see Maude Emmer, MD or one of the following Advanced Practice Providers on your designated Care Team:   Laymon Qua, PA-C  [place], [place] Olivia Pavy, [place]    We recommend signing up for the patient portal called MyChart.  Sign up information is provided on this After Visit Summary.  MyChart is used to connect with patients for Virtual Visits (Telemedicine).  Patients are able to view lab/test results, encounter notes, upcoming appointments, etc.  Non-urgent messages can be sent to your provider as well.   To learn more about what you can do with MyChart, go to [email or web address].   Other Instructions Blood pressure log given to patient to track BP's at home. Discussed 2 Gm sodium restriction   Thank you for choosing [facility]  !

## 2024-02-12 LAB — OPHTHALMOLOGY REPORT-SCANNED
